# Patient Record
Sex: Female | Born: 1975 | Race: Black or African American | Hispanic: No | Marital: Married | Smoking: Never smoker
Health system: Southern US, Community
[De-identification: ages and names within clinical notes are randomized; demographics above are authoritative.]

## PROBLEM LIST (undated history)

## (undated) DIAGNOSIS — K219 Gastro-esophageal reflux disease without esophagitis: Secondary | ICD-10-CM

## (undated) DIAGNOSIS — E785 Hyperlipidemia, unspecified: Secondary | ICD-10-CM

## (undated) DIAGNOSIS — M25471 Effusion, right ankle: Secondary | ICD-10-CM

## (undated) DIAGNOSIS — Z91018 Allergy to other foods: Secondary | ICD-10-CM

## (undated) DIAGNOSIS — T783XXA Angioneurotic edema, initial encounter: Secondary | ICD-10-CM

## (undated) DIAGNOSIS — I10 Essential (primary) hypertension: Secondary | ICD-10-CM

## (undated) DIAGNOSIS — R5383 Other fatigue: Secondary | ICD-10-CM

## (undated) DIAGNOSIS — K5900 Constipation, unspecified: Secondary | ICD-10-CM

## (undated) DIAGNOSIS — D649 Anemia, unspecified: Secondary | ICD-10-CM

## (undated) DIAGNOSIS — E559 Vitamin D deficiency, unspecified: Secondary | ICD-10-CM

## (undated) DIAGNOSIS — E739 Lactose intolerance, unspecified: Secondary | ICD-10-CM

## (undated) DIAGNOSIS — M2550 Pain in unspecified joint: Secondary | ICD-10-CM

## (undated) DIAGNOSIS — N289 Disorder of kidney and ureter, unspecified: Secondary | ICD-10-CM

## (undated) DIAGNOSIS — M25472 Effusion, left ankle: Secondary | ICD-10-CM

## (undated) DIAGNOSIS — R7303 Prediabetes: Secondary | ICD-10-CM

## (undated) DIAGNOSIS — R0602 Shortness of breath: Secondary | ICD-10-CM

## (undated) DIAGNOSIS — M1990 Unspecified osteoarthritis, unspecified site: Secondary | ICD-10-CM

## (undated) DIAGNOSIS — M549 Dorsalgia, unspecified: Secondary | ICD-10-CM

## (undated) DIAGNOSIS — L509 Urticaria, unspecified: Secondary | ICD-10-CM

## (undated) DIAGNOSIS — M25474 Effusion, right foot: Secondary | ICD-10-CM

## (undated) DIAGNOSIS — M25475 Effusion, left foot: Secondary | ICD-10-CM

## (undated) DIAGNOSIS — Z86718 Personal history of other venous thrombosis and embolism: Secondary | ICD-10-CM

## (undated) HISTORY — DX: Dorsalgia, unspecified: M54.9

## (undated) HISTORY — DX: Allergy to other foods: Z91.018

## (undated) HISTORY — DX: Effusion, left ankle: M25.472

## (undated) HISTORY — DX: Effusion, left foot: M25.475

## (undated) HISTORY — DX: Pain in unspecified joint: M25.50

## (undated) HISTORY — DX: Personal history of other venous thrombosis and embolism: Z86.718

## (undated) HISTORY — DX: Urticaria, unspecified: L50.9

## (undated) HISTORY — DX: Essential (primary) hypertension: I10

## (undated) HISTORY — DX: Other fatigue: R53.83

## (undated) HISTORY — DX: Effusion, right foot: M25.474

## (undated) HISTORY — DX: Gastro-esophageal reflux disease without esophagitis: K21.9

## (undated) HISTORY — DX: Unspecified osteoarthritis, unspecified site: M19.90

## (undated) HISTORY — DX: Disorder of kidney and ureter, unspecified: N28.9

## (undated) HISTORY — DX: Angioneurotic edema, initial encounter: T78.3XXA

## (undated) HISTORY — DX: Hyperlipidemia, unspecified: E78.5

## (undated) HISTORY — PX: TUBAL LIGATION: SHX77

## (undated) HISTORY — DX: Effusion, right ankle: M25.471

## (undated) HISTORY — DX: Shortness of breath: R06.02

## (undated) HISTORY — PX: BREAST EXCISIONAL BIOPSY: SUR124

## (undated) HISTORY — DX: Constipation, unspecified: K59.00

## (undated) HISTORY — DX: Lactose intolerance, unspecified: E73.9

## (undated) HISTORY — DX: Vitamin D deficiency, unspecified: E55.9

## (undated) HISTORY — DX: Prediabetes: R73.03

## (undated) HISTORY — DX: Anemia, unspecified: D64.9

---

## 1997-07-24 ENCOUNTER — Emergency Department (HOSPITAL_COMMUNITY): Admission: EM | Admit: 1997-07-24 | Discharge: 1997-07-24 | Payer: Self-pay | Admitting: Emergency Medicine

## 1999-12-07 ENCOUNTER — Inpatient Hospital Stay (HOSPITAL_COMMUNITY): Admission: AD | Admit: 1999-12-07 | Discharge: 1999-12-07 | Payer: Self-pay | Admitting: Obstetrics & Gynecology

## 1999-12-07 ENCOUNTER — Encounter: Payer: Self-pay | Admitting: Obstetrics & Gynecology

## 1999-12-30 ENCOUNTER — Other Ambulatory Visit: Admission: RE | Admit: 1999-12-30 | Discharge: 1999-12-30 | Payer: Self-pay | Admitting: Obstetrics and Gynecology

## 1999-12-30 ENCOUNTER — Encounter (INDEPENDENT_AMBULATORY_CARE_PROVIDER_SITE_OTHER): Payer: Self-pay

## 2000-06-26 ENCOUNTER — Inpatient Hospital Stay (HOSPITAL_COMMUNITY): Admission: AD | Admit: 2000-06-26 | Discharge: 2000-06-28 | Payer: Self-pay | Admitting: Obstetrics and Gynecology

## 2000-07-22 ENCOUNTER — Other Ambulatory Visit: Admission: RE | Admit: 2000-07-22 | Discharge: 2000-07-22 | Payer: Self-pay | Admitting: Obstetrics and Gynecology

## 2001-04-19 ENCOUNTER — Other Ambulatory Visit: Admission: RE | Admit: 2001-04-19 | Discharge: 2001-04-19 | Payer: Self-pay | Admitting: Obstetrics and Gynecology

## 2001-08-26 ENCOUNTER — Emergency Department (HOSPITAL_COMMUNITY): Admission: EM | Admit: 2001-08-26 | Discharge: 2001-08-26 | Payer: Self-pay | Admitting: Emergency Medicine

## 2002-04-22 ENCOUNTER — Other Ambulatory Visit: Admission: RE | Admit: 2002-04-22 | Discharge: 2002-04-22 | Payer: Self-pay | Admitting: Obstetrics and Gynecology

## 2003-04-25 ENCOUNTER — Other Ambulatory Visit: Admission: RE | Admit: 2003-04-25 | Discharge: 2003-04-25 | Payer: Self-pay | Admitting: Obstetrics and Gynecology

## 2004-06-05 ENCOUNTER — Other Ambulatory Visit: Admission: RE | Admit: 2004-06-05 | Discharge: 2004-06-05 | Payer: Self-pay | Admitting: Obstetrics and Gynecology

## 2005-03-24 HISTORY — PX: BREAST BIOPSY: SHX20

## 2005-08-31 ENCOUNTER — Emergency Department (HOSPITAL_COMMUNITY): Admission: EM | Admit: 2005-08-31 | Discharge: 2005-09-01 | Payer: Self-pay | Admitting: Emergency Medicine

## 2006-02-26 ENCOUNTER — Other Ambulatory Visit: Admission: RE | Admit: 2006-02-26 | Discharge: 2006-02-26 | Payer: Self-pay | Admitting: Obstetrics & Gynecology

## 2007-06-01 ENCOUNTER — Other Ambulatory Visit: Admission: RE | Admit: 2007-06-01 | Discharge: 2007-06-01 | Payer: Self-pay | Admitting: Obstetrics and Gynecology

## 2008-02-15 ENCOUNTER — Other Ambulatory Visit: Admission: RE | Admit: 2008-02-15 | Discharge: 2008-02-15 | Payer: Self-pay | Admitting: Obstetrics and Gynecology

## 2008-06-01 ENCOUNTER — Other Ambulatory Visit: Admission: RE | Admit: 2008-06-01 | Discharge: 2008-06-01 | Payer: Self-pay | Admitting: Obstetrics and Gynecology

## 2008-12-05 ENCOUNTER — Emergency Department (HOSPITAL_COMMUNITY): Admission: EM | Admit: 2008-12-05 | Discharge: 2008-12-06 | Payer: Self-pay | Admitting: Emergency Medicine

## 2008-12-14 ENCOUNTER — Encounter: Payer: Self-pay | Admitting: Obstetrics & Gynecology

## 2008-12-14 ENCOUNTER — Ambulatory Visit (HOSPITAL_COMMUNITY): Admission: RE | Admit: 2008-12-14 | Discharge: 2008-12-14 | Payer: Self-pay | Admitting: Obstetrics & Gynecology

## 2009-01-25 ENCOUNTER — Ambulatory Visit (HOSPITAL_COMMUNITY): Admission: RE | Admit: 2009-01-25 | Discharge: 2009-01-25 | Payer: Self-pay | Admitting: Obstetrics & Gynecology

## 2009-02-24 ENCOUNTER — Inpatient Hospital Stay (HOSPITAL_COMMUNITY): Admission: AD | Admit: 2009-02-24 | Discharge: 2009-02-24 | Payer: Self-pay | Admitting: Obstetrics & Gynecology

## 2009-03-14 ENCOUNTER — Ambulatory Visit (HOSPITAL_COMMUNITY): Admission: RE | Admit: 2009-03-14 | Discharge: 2009-03-14 | Payer: Self-pay | Admitting: Obstetrics & Gynecology

## 2009-05-06 ENCOUNTER — Inpatient Hospital Stay (HOSPITAL_COMMUNITY): Admission: AD | Admit: 2009-05-06 | Discharge: 2009-05-06 | Payer: Self-pay | Admitting: Obstetrics

## 2009-05-06 ENCOUNTER — Inpatient Hospital Stay (HOSPITAL_COMMUNITY): Admission: AD | Admit: 2009-05-06 | Discharge: 2009-05-06 | Payer: Self-pay | Admitting: Maternal and Fetal Medicine

## 2009-05-14 ENCOUNTER — Inpatient Hospital Stay (HOSPITAL_COMMUNITY): Admission: RE | Admit: 2009-05-14 | Discharge: 2009-05-16 | Payer: Self-pay | Admitting: Obstetrics & Gynecology

## 2009-05-14 ENCOUNTER — Encounter: Payer: Self-pay | Admitting: Obstetrics & Gynecology

## 2009-05-14 DIAGNOSIS — O1490 Unspecified pre-eclampsia, unspecified trimester: Secondary | ICD-10-CM

## 2010-04-14 ENCOUNTER — Encounter: Payer: Self-pay | Admitting: Obstetrics & Gynecology

## 2010-04-15 ENCOUNTER — Encounter: Payer: Self-pay | Admitting: Obstetrics & Gynecology

## 2010-06-12 LAB — COMPREHENSIVE METABOLIC PANEL
ALT: 10 U/L (ref 0–35)
AST: 16 U/L (ref 0–37)
Albumin: 2.3 g/dL — ABNORMAL LOW (ref 3.5–5.2)
Alkaline Phosphatase: 158 U/L — ABNORMAL HIGH (ref 39–117)
CO2: 24 mEq/L (ref 19–32)
Calcium: 8.2 mg/dL — ABNORMAL LOW (ref 8.4–10.5)
Calcium: 8.2 mg/dL — ABNORMAL LOW (ref 8.4–10.5)
Creatinine, Ser: 0.62 mg/dL (ref 0.4–1.2)
GFR calc Af Amer: >60 mL/min (ref >60)
GFR calc non Af Amer: >60 mL/min (ref >60)
Glucose, Bld: 77 mg/dL (ref 70–99)
Sodium: 136 mEq/L (ref 135–145)
Total Bilirubin: 0.4 mg/dL (ref 0.3–1.2)
Total Protein: 5.3 g/dL — ABNORMAL LOW (ref 6.0–8.3)

## 2010-06-12 LAB — CBC
HCT: 24.9 % — ABNORMAL LOW (ref 36.0–46.0)
Hemoglobin: 8.3 g/dL — ABNORMAL LOW (ref 12.0–15.0)
MCHC: 31.7 g/dL (ref 30.0–36.0)
MCHC: 31.7 g/dL (ref 30.0–36.0)
MCV: 68.5 fL — ABNORMAL LOW (ref 78.0–100.0)
Platelets: 209 10*3/uL (ref 150–400)
RBC: 3.63 MIL/uL — ABNORMAL LOW (ref 3.87–5.11)
RBC: 3.73 MIL/uL — ABNORMAL LOW (ref 3.87–5.11)
RBC: 4.05 MIL/uL (ref 3.87–5.11)
RDW: 16.3 % — ABNORMAL HIGH (ref 11.5–15.5)
WBC: 13.7 10*3/uL — ABNORMAL HIGH (ref 4.0–10.5)

## 2010-06-12 LAB — URIC ACID: Uric Acid, Serum: 4.8 mg/dL (ref 2.4–7.0)

## 2010-06-12 LAB — RPR: RPR Ser Ql: NONREACTIVE

## 2010-06-12 LAB — LACTATE DEHYDROGENASE: LDH: 124 U/L (ref 94–250)

## 2010-06-25 LAB — CBC
MCHC: 32.6 g/dL (ref 30.0–36.0)
MCV: 76.2 fL — ABNORMAL LOW (ref 78.0–100.0)
Platelets: 228 10*3/uL (ref 150–400)
RBC: 3.48 MIL/uL — ABNORMAL LOW (ref 3.87–5.11)

## 2010-06-25 LAB — URINALYSIS, ROUTINE W REFLEX MICROSCOPIC
Bilirubin Urine: NEGATIVE
Glucose, UA: NEGATIVE mg/dL
Ketones, ur: 40 mg/dL — AB
Leukocytes, UA: NEGATIVE
Specific Gravity, Urine: >1.03 — ABNORMAL HIGH (ref 1.005–1.030)
pH: 6 (ref 5.0–8.0)

## 2010-06-25 LAB — URINE CULTURE

## 2010-06-25 LAB — URINE MICROSCOPIC-ADD ON

## 2010-06-28 LAB — POCT I-STAT, CHEM 8
BUN: 6 mg/dL (ref 6–23)
Calcium, Ion: 1.19 mmol/L (ref 1.12–1.32)
Chloride: 105 mEq/L (ref 96–112)
Creatinine, Ser: 0.8 mg/dL (ref 0.4–1.2)
Glucose, Bld: 87 mg/dL (ref 70–99)
TCO2: 22 mmol/L (ref 0–100)

## 2010-08-22 DIAGNOSIS — O1490 Unspecified pre-eclampsia, unspecified trimester: Secondary | ICD-10-CM

## 2015-01-02 ENCOUNTER — Ambulatory Visit (INDEPENDENT_AMBULATORY_CARE_PROVIDER_SITE_OTHER): Payer: BC Managed Care – PPO | Admitting: Internal Medicine

## 2015-01-02 ENCOUNTER — Encounter (INDEPENDENT_AMBULATORY_CARE_PROVIDER_SITE_OTHER): Payer: Self-pay

## 2015-01-02 ENCOUNTER — Encounter: Payer: Self-pay | Admitting: Internal Medicine

## 2015-01-02 VITALS — BP 136/84 | HR 77 | Temp 98.6°F | Ht 66.0 in | Wt 237.0 lb

## 2015-01-02 DIAGNOSIS — M129 Arthropathy, unspecified: Secondary | ICD-10-CM | POA: Diagnosis not present

## 2015-01-02 DIAGNOSIS — M722 Plantar fascial fibromatosis: Secondary | ICD-10-CM

## 2015-01-02 DIAGNOSIS — M25512 Pain in left shoulder: Secondary | ICD-10-CM | POA: Diagnosis not present

## 2015-01-02 DIAGNOSIS — M170 Bilateral primary osteoarthritis of knee: Secondary | ICD-10-CM

## 2015-01-02 DIAGNOSIS — M25511 Pain in right shoulder: Secondary | ICD-10-CM | POA: Diagnosis not present

## 2015-01-02 LAB — RHEUMATOID FACTOR

## 2015-01-02 NOTE — Progress Notes (Signed)
Pre visit review using our clinic review tool, if applicable. No additional management support is needed unless otherwise documented below in the visit note. 

## 2015-01-02 NOTE — Assessment & Plan Note (Signed)
Exacerbated by weight Discussed trying to lose 5 % of total body weight Take Aleve as needed

## 2015-01-02 NOTE — Patient Instructions (Signed)

## 2015-01-02 NOTE — Progress Notes (Signed)
HPI  Pt presents to the clinic today to establish care. She recently moved from [place].  Arthritis: Mainly in her knees. She does not take anything OTC for this.  She does c/o bilateral shoulder pain. She reports this started 2 months ago. She describes the pain as achy. She feels the most pain when she tries to lift her arms above her head. She denies any weakness, numbness or tingling in her arms. She denies any injury to the area. She has not taken anything OTC for this.  She also c/o pain of pain in her right heel. This started 1 years ago, but has been intermittent. The pain is worse in the morning but does seem to get better throughout the day. She describes the pain as sharp and stabbing. The pain radiates into the arch. She denies numbnees or tingling in her feet. She has tried Ibuprofen with some relief.  Flu: never Tetanus: > 10 years ago Pap Smear: 12/2013- normal Dentist: biannually  Past Medical History  Diagnosis Date  . Arthritis     No current outpatient prescriptions on file.   No current facility-administered medications for this visit.    Allergies  Allergen Reactions  . Latex Rash    Family History  Problem Relation Age of Onset  . Hyperlipidemia Father   . Hypertension Father   . Arthritis Maternal Grandmother     Social History   Social History  . Marital Status: Divorced    Spouse Name: N/A  . Number of Children: N/A  . Years of Education: N/A   Occupational History  . Not on file.   Social History Main Topics  . Smoking status: Never Smoker   . Smokeless tobacco: Never Used  . Alcohol Use: 0.0 oz/week    0 Standard drinks or equivalent per week     Comment: social  . Drug Use: Not on file  . Sexual Activity: Not on file   Other Topics Concern  . Not on file   Social History Narrative  . No narrative on file    ROS:  Constitutional: Denies fever, malaise, fatigue, headache or abrupt weight changes.  Respiratory: Denies  difficulty breathing, shortness of breath, cough or sputum production.   Cardiovascular: Denies chest pain, chest tightness, palpitations or swelling in the hands or feet.  Musculoskeletal: Pt reports joint pains. Denies decrease in range of motion, difficulty with gait, muscle pain or joint swelling.  Skin: Denies redness, rashes, lesions or ulcercations.  Neurological: Denies dizziness, difficulty with memory, difficulty with speech or problems with balance and coordination.  Psych: Denies anxiety, depression, SI/HI.  No other specific complaints in a complete review of systems (except as listed in HPI above).  PE:  BP 136/84 mmHg  Pulse 77  Temp(Src) 98.6 F (37 C) (Oral)  Ht 5' 6"  (1.676 m)  Wt 237 lb (107.502 kg)  BMI 38.27 kg/m2  SpO2 98%  LMP 01/02/2015 Wt Readings from Last 3 Encounters:  01/02/15 237 lb (107.502 kg)    General: Appears her stated age, obese in NAD. Cardiovascular: Normal rate and rhythm. S1,S2 noted.  No murmur, rubs or gallops noted. Pulmonary/Chest: Normal effort and positive vesicular breath sounds. No respiratory distress. No wheezes, rales or ronchi noted.  Musculoskeletal: Normal internal and external rotation of bilateral shoulders. Pain with palpation of bilateral subacromial bursa. Strength 5/5 BUE. Negative drop can test. Pain with palpation of the right heel and right arch. No difficulty with gait note. Neurological: Alert and oriented. Sensation  intact BLE. Psychiatric: Mood and affect normal. Behavior is normal. Judgment and thought content normal.     BMET    Component Value Date/Time   NA 137 05/14/2009 0921   K 3.4* 05/14/2009 0921   CL 107 05/14/2009 0921   CO2 23 05/14/2009 0921   GLUCOSE 76 05/14/2009 0921   BUN 3* 05/14/2009 0921   CREATININE 0.62 05/14/2009 0921   CALCIUM 8.2* 05/14/2009 0921   GFRNONAA >60 05/14/2009 0921   GFRAA  05/14/2009 0921    >60        The eGFR has been calculated using the MDRD equation. This  calculation has not been validated in all clinical situations. eGFR's persistently <60 mL/min signify possible Chronic Kidney Disease.    Lipid Panel  No results found for: CHOL, TRIG, HDL, CHOLHDL, VLDL, LDLCALC  CBC    Component Value Date/Time   WBC 13.7* 05/15/2009 0555   RBC 3.63* 05/15/2009 0555   HGB 8.0* 05/15/2009 0555   HCT 24.9* 05/15/2009 0555   PLT 198 05/15/2009 0555   MCV 68.5* 05/15/2009 0555   MCHC 32.2 05/15/2009 0555   RDW 16.1* 05/15/2009 0555    Hgb A1C No results found for: HGBA1C   Assessment and Plan:  Bilateral Shoulder pain:  ? Bursitis Take Aleve once daily Concern for PMR- will check ESR, RF and ANA If no improvement noted, will consider pred taper  Plantar Fasciitis, right foot:  Information given on rehab sports medicine Stretching, inserts Aleve once daily will help  RTC as needed or if symptoms persist or worsen

## 2015-01-03 LAB — SEDIMENTATION RATE: Sed Rate: 48 mm/hr — ABNORMAL HIGH (ref 0–22)

## 2015-01-03 LAB — ANA: ANA: NEGATIVE

## 2015-02-23 ENCOUNTER — Ambulatory Visit: Payer: BC Managed Care – PPO | Admitting: Internal Medicine

## 2015-05-31 ENCOUNTER — Ambulatory Visit (HOSPITAL_BASED_OUTPATIENT_CLINIC_OR_DEPARTMENT_OTHER)
Admit: 2015-05-31 | Discharge: 2015-05-31 | Disposition: A | Discharge status: Home / Self Care | Payer: BC Managed Care – PPO | Attending: Emergency Medicine | Admitting: Emergency Medicine

## 2015-05-31 ENCOUNTER — Ambulatory Visit: Payer: BC Managed Care – PPO | Admitting: Internal Medicine

## 2015-05-31 ENCOUNTER — Emergency Department (HOSPITAL_COMMUNITY): Payer: BC Managed Care – PPO

## 2015-05-31 ENCOUNTER — Emergency Department (HOSPITAL_COMMUNITY)
Admission: EM | Admit: 2015-05-31 | Discharge: 2015-05-31 | Disposition: A | Discharge status: Home / Self Care | Payer: BC Managed Care – PPO | Attending: Emergency Medicine | Admitting: Emergency Medicine

## 2015-05-31 ENCOUNTER — Encounter (HOSPITAL_COMMUNITY): Payer: Self-pay

## 2015-05-31 DIAGNOSIS — Z91040 Latex allergy status: Secondary | ICD-10-CM | POA: Diagnosis not present

## 2015-05-31 DIAGNOSIS — L03116 Cellulitis of left lower limb: Secondary | ICD-10-CM | POA: Diagnosis not present

## 2015-05-31 DIAGNOSIS — F419 Anxiety disorder, unspecified: Secondary | ICD-10-CM | POA: Diagnosis not present

## 2015-05-31 DIAGNOSIS — Y9289 Other specified places as the place of occurrence of the external cause: Secondary | ICD-10-CM | POA: Diagnosis not present

## 2015-05-31 DIAGNOSIS — Z8739 Personal history of other diseases of the musculoskeletal system and connective tissue: Secondary | ICD-10-CM | POA: Diagnosis not present

## 2015-05-31 DIAGNOSIS — Y9389 Activity, other specified: Secondary | ICD-10-CM | POA: Insufficient documentation

## 2015-05-31 DIAGNOSIS — M7989 Other specified soft tissue disorders: Secondary | ICD-10-CM

## 2015-05-31 DIAGNOSIS — S8992XA Unspecified injury of left lower leg, initial encounter: Secondary | ICD-10-CM | POA: Diagnosis present

## 2015-05-31 DIAGNOSIS — Y998 Other external cause status: Secondary | ICD-10-CM | POA: Insufficient documentation

## 2015-05-31 DIAGNOSIS — W1839XA Other fall on same level, initial encounter: Secondary | ICD-10-CM | POA: Diagnosis not present

## 2015-05-31 DIAGNOSIS — M79609 Pain in unspecified limb: Secondary | ICD-10-CM | POA: Diagnosis not present

## 2015-05-31 MED ORDER — HYDROCODONE-ACETAMINOPHEN 5-325 MG PO TABS: 1.0000 | ORAL_TABLET | ORAL | Status: DC | PRN

## 2015-05-31 MED ORDER — CLINDAMYCIN HCL 300 MG PO CAPS: 300.0000 mg | ORAL_CAPSULE | Freq: Four times a day (QID) | ORAL | Status: DC

## 2015-05-31 MED ORDER — LIDOCAINE-EPINEPHRINE (PF) 2 %-1:200000 IJ SOLN
20.0000 mL | Freq: Once | INTRAMUSCULAR | Status: DC
  Filled 2015-05-31: qty 20

## 2015-05-31 NOTE — ED Notes (Signed)
Pt c/o L lower leg pain and blood blister on anterior R hand after a fall x 2 weeks ago.  Pain score 5/10.  Pt has not taken anything for pain.  Swelling noted to LL leg.  Pt ambulated into department w/o difficulty.

## 2015-05-31 NOTE — Progress Notes (Signed)
VASCULAR LAB PRELIMINARY  PRELIMINARY  PRELIMINARY  PRELIMINARY  Left lower extremity venous duplex completed.    Preliminary report:  Left:  No evidence of DVT, superficial thrombosis, or Baker's cyst.  Gabriella Riley, RVS 05/31/2015, 11:09 AM

## 2015-05-31 NOTE — Discharge Instructions (Signed)
Read the information below.  Use the prescribed medication as directed.  Please discuss all new medications with your pharmacist.  Do not take additional tylenol while taking the prescribed pain medication to avoid overdose.  You may return to the Emergency Department at any time for worsening condition or any new symptoms that concern you.   If you develop increased redness, swelling, pus draining from the wound, uncontrolled pain, or fevers greater than 100.4, return to the ER immediately for a recheck.     Cellulitis Cellulitis is an infection of the skin and the tissue beneath it. The infected area is usually red and tender. Cellulitis occurs most often in the arms and lower legs.  CAUSES  Cellulitis is caused by bacteria that enter the skin through cracks or cuts in the skin. The most common types of bacteria that cause cellulitis are staphylococci and streptococci. SIGNS AND SYMPTOMS   Redness and warmth.  Swelling.  Tenderness or pain.  Fever. DIAGNOSIS  Your health care provider can usually determine what is wrong based on a physical exam. Blood tests may also be done. TREATMENT  Treatment usually involves taking an antibiotic medicine. HOME CARE INSTRUCTIONS   Take your antibiotic medicine as directed by your health care provider. Finish the antibiotic even if you start to feel better.  Keep the infected arm or leg elevated to reduce swelling.  Apply a warm cloth to the affected area up to 4 times per day to relieve pain.  Take medicines only as directed by your health care provider.  Keep all follow-up visits as directed by your health care provider. SEEK MEDICAL CARE IF:   You notice red streaks coming from the infected area.  Your red area gets larger or turns dark in color.  Your bone or joint underneath the infected area becomes painful after the skin has healed.  Your infection returns in the same area or another area.  You notice a swollen bump in the infected  area.  You develop new symptoms.  You have a fever. SEEK IMMEDIATE MEDICAL CARE IF:   You feel very sleepy.  You develop vomiting or diarrhea.  You have a general ill feeling (malaise) with muscle aches and pains.   This information is not intended to replace advice given to you by your health care provider. Make sure you discuss any questions you have with your health care provider.   Document Released: 12/18/2004 Document Revised: 11/29/2014 Document Reviewed: 05/26/2011 Elsevier Interactive Patient Education [employer].

## 2015-05-31 NOTE — ED Provider Notes (Signed)
CSN: [id number]     Arrival date & time 05/31/15  [id number] History   First MD Initiated Contact with Patient 05/31/15 [phone]     Chief Complaint  Patient presents with  . Fall  . Leg Pain     (Consider location/radiation/quality/duration/timing/severity/associated sxs/prior Treatment) The history is provided by the patient.    Pt presents with localized swelling to the left anterior shin that began yesterday.  States she fell two weeks ago and developed a bruise in this area, was somewhat sore but not bothersome.  Yesterday the area started to swelling and become hot and more painful.  She is able to walk.  Denies weakness or numbness of the leg.  Has small blood blister on her right palm from the fall that is not bothering her.  Otherwise denies other injuries.  Has not taken any medication for her symptoms.  Denies fevers, chills, myalgias, CP, SOB.  Has become anxious since coworkers made her concerned for blood clot.  She has no hx recent immobilization, no exogenous estrogen.  Thinks her father had a blood clot at some point but does not know the circumstances or details.    Past Medical History  Diagnosis Date  . Arthritis    Past Surgical History  Procedure Laterality Date  . Breast biopsy Left 2007  . Tubal ligation     Family History  Problem Relation Age of Onset  . Hyperlipidemia Father   . Hypertension Father   . Arthritis Maternal Grandmother    Social History  Substance Use Topics  . Smoking status: Never Smoker   . Smokeless tobacco: Never Used  . Alcohol Use: 0.0 oz/week    0 Standard drinks or equivalent per week     Comment: social   OB History    No data available     Review of Systems  All other systems reviewed and are negative.     Allergies  Latex  Home Medications   Prior to Admission medications   Not on File   BP 135/99 mmHg  Pulse 72  Temp(Src) 98.3 F (36.8 C) (Oral)  Resp 16  SpO2 100%  LMP 05/12/2015 Physical Exam   Constitutional: She appears well-developed and well-nourished. No distress.  HENT:  Head: Normocephalic and atraumatic.  Neck: Neck supple.  Cardiovascular: Normal rate and regular rhythm.   Pulmonary/Chest: Effort normal and breath sounds normal. No respiratory distress. She has no wheezes. She has no rales.  Musculoskeletal:  Left lower leg with localized area of soft swelling, erythema, warmth, tender to palpation.  Distal pulses and sensation intact.    Neurological: She is alert.  Skin: She is not diaphoretic.  Psychiatric: She has a normal mood and affect. Her behavior is normal.  Nursing note and vitals reviewed.   ED Course  Procedures (including critical care time) Labs Review Labs Reviewed - No data to display  Imaging Review Dg Tibia/fibula Left  05/31/2015  CLINICAL DATA:  Fall 5 days ago, distal left lower leg swelling EXAM: LEFT TIBIA AND FIBULA - 2 VIEW COMPARISON:  None. FINDINGS: Four views of the left tibia fibula submitted. No acute fracture or subluxation. Mild soft tissue swelling distal anterior tibial region. No periosteal reaction or bony erosion. IMPRESSION: No acute fracture or subluxation. Mild soft tissue swelling distal anterior tibial region. Electronically Signed   By: Lahoma Crocker M.D.   On: 05/31/2015 09:59   I have personally reviewed and evaluated these images and lab results as part of my  medical decision-making.   EKG Interpretation None       Duplex US is negative.    MDM   Final diagnoses:  Cellulitis of left lower extremity   Afebrile, nontoxic patient with area of soft swelling with overlying cellulitis as the site of trauma. Possibly cellulitis vs Infected hematoma vs less likely abscess.  Pt declines I&D or even aspiration for further investigation.  Pt agrees to close PCP or ED follow up for recheck in 2 days, discussed return precautions.  D/C home with clindamycin, norco, crutches.  2 day recheck.   Discussed result, findings,  treatment, and follow up  with patient.  Pt givurn  precautions.  Pt verbalizes understanding and agrees with plan.         Clayton Bibles, PA-C 05/31/15 Kyri Dai [place], MD 06/01/15 [phone]

## 2015-05-31 NOTE — ED Notes (Signed)
Bed: WA01 Expected date:  Expected time:  Means of arrival:  Comments: 

## 2015-05-31 NOTE — ED Notes (Signed)
Vascular at bedside

## 2015-06-03 ENCOUNTER — Emergency Department (HOSPITAL_COMMUNITY)
Admission: EM | Admit: 2015-06-03 | Discharge: 2015-06-03 | Disposition: A | Discharge status: Home / Self Care | Payer: BC Managed Care – PPO | Attending: Emergency Medicine | Admitting: Emergency Medicine

## 2015-06-03 DIAGNOSIS — L03116 Cellulitis of left lower limb: Secondary | ICD-10-CM | POA: Diagnosis not present

## 2015-06-03 DIAGNOSIS — Z09 Encounter for follow-up examination after completed treatment for conditions other than malignant neoplasm: Secondary | ICD-10-CM | POA: Diagnosis present

## 2015-06-03 DIAGNOSIS — Z792 Long term (current) use of antibiotics: Secondary | ICD-10-CM | POA: Diagnosis not present

## 2015-06-03 DIAGNOSIS — M1990 Unspecified osteoarthritis, unspecified site: Secondary | ICD-10-CM | POA: Insufficient documentation

## 2015-06-03 DIAGNOSIS — Z91040 Latex allergy status: Secondary | ICD-10-CM | POA: Diagnosis not present

## 2015-06-03 NOTE — Discharge Instructions (Signed)

## 2015-06-03 NOTE — ED Provider Notes (Signed)
CSN: [id number]     Arrival date & time 06/03/15  0931 History   First MD Initiated Contact with Patient 06/03/15 [phone]     Chief Complaint  Patient presents with  . Follow-up     (Consider location/radiation/quality/duration/timing/severity/associated sxs/prior Treatment) HPI Comments: 40 year old female who presents for reevaluation of left leg pain. The patient was seen here 2 days ago for left lower leg pain and swelling in an area that she had previously developed a bruise from minor trauma. She was diagnosed with cellulitis and given clindamycin. DVT ultrasound was negative. She was discharged home with instructions to follow-up in 2 days for reevaluation. The patient presents today for follow-up. She's been taking clindamycin as prescribed. She reports an improvement in erythema but does note that she has persistent pain which is worse after she stands up and worse after she has been walking on her leg for a while. She has intermittent swelling that is worse when she has been on her feet. She denies any fevers, vomiting, or systemic illness. Normal sensation in her foot.  The history is provided by the patient.    Past Medical History  Diagnosis Date  . Arthritis    Past Surgical History  Procedure Laterality Date  . Breast biopsy Left 2007  . Tubal ligation     Family History  Problem Relation Age of Onset  . Hyperlipidemia Father   . Hypertension Father   . Arthritis Maternal Grandmother    Social History  Substance Use Topics  . Smoking status: Never Smoker   . Smokeless tobacco: Never Used  . Alcohol Use: 0.0 oz/week    0 Standard drinks or equivalent per week     Comment: social   OB History    No data available     Review of Systems 10 Systems reviewed and are negative for acute change except as noted in the HPI.    Allergies  Latex  Home Medications   Prior to Admission medications   Medication Sig Start Date End Date Taking? Authorizing Provider   Aspirin-Acetaminophen-Caffeine (GOODY HEADACHE PO) Take 1 Dose by mouth daily as needed (pain).    Historical Provider, MD  clindamycin (CLEOCIN) 300 MG capsule Take 1 capsule (300 mg total) by mouth 4 (four) times daily. 05/31/15   Clayton Bibles, PA-C  HYDROcodone-acetaminophen (NORCO/VICODIN) 5-325 MG tablet Take 1 tablet by mouth every 4 (four) hours as needed for moderate pain or severe pain. 05/31/15   Clayton Bibles, PA-C   BP 169/97 mmHg  Pulse 72  Temp(Src) 98.2 F (36.8 C) (Oral)  Resp 18  Wt 237 lb (107.502 kg)  SpO2 100%  LMP 05/12/2015 Physical Exam  Constitutional: She is oriented to person, place, and time. She appears well-developed and well-nourished. No distress.  HENT:  Head: Normocephalic and atraumatic.  Eyes: Conjunctivae are normal.  Musculoskeletal:  Area of mild swelling, erythema and TTP on anterior L lower leg; 2+ pedal pulse, normal sensation L foot  Neurological: She is alert and oriented to person, place, and time.  Skin: Skin is warm and dry.  Mild erythema on anterior L lower leg above ankle w/ focal area of induration; erythema has receded from line drawn on 3/7  Psychiatric: She has a normal mood and affect. Judgment normal.  Nursing note and vitals reviewed.   ED Course  Procedures (including critical care time) Labs Review Labs Reviewed - No data to display  ULTRASOUND LIMITED SOFT TISSUE/ MUSCULOSKELETAL:  Indication: left lower leg pain and  swelling Linear probe used to evaluate area of interest in two planes. Findings:  No fluid collection, no significant cobblestoning Performed by: Dr Rex Kras Images saved electronically    MDM   Final diagnoses:  Left leg cellulitis    Patient presents for follow-up of left lower leg cellulitis after she was given clindamycin 2 days ago. She was well-appearing and afebrile at presentation. She had a small area of redness and induration on her anterior left lower leg. The area of redness had receded from the  original line drawn 2 days ago, reassuring that the area seems to be improving. Bedside ultrasound shows no fluid collection or significant cobblestoning. She denies other complaints. Instructed on supportive care including elevation and compression and extensively reviewed return precautions. Patient voiced understanding and was discharged in satisfactory condition.   Sharlett Iles, MD 06/03/15 1021

## 2015-06-03 NOTE — ED Notes (Signed)
Seen here on 3/9 for cellulitis to left lower extremity. Taking clindamycin as prescribed x 2.5 days. Some improvement in erythema but pain is increasing.

## 2015-06-04 ENCOUNTER — Ambulatory Visit (INDEPENDENT_AMBULATORY_CARE_PROVIDER_SITE_OTHER): Payer: BC Managed Care – PPO | Admitting: Internal Medicine

## 2015-06-04 ENCOUNTER — Encounter: Payer: Self-pay | Admitting: Internal Medicine

## 2015-06-04 VITALS — BP 134/94 | HR 82 | Temp 98.2°F

## 2015-06-04 DIAGNOSIS — S8012XS Contusion of left lower leg, sequela: Secondary | ICD-10-CM | POA: Diagnosis not present

## 2015-06-04 DIAGNOSIS — T3695XA Adverse effect of unspecified systemic antibiotic, initial encounter: Secondary | ICD-10-CM

## 2015-06-04 DIAGNOSIS — L03116 Cellulitis of left lower limb: Secondary | ICD-10-CM

## 2015-06-04 DIAGNOSIS — B379 Candidiasis, unspecified: Secondary | ICD-10-CM | POA: Diagnosis not present

## 2015-06-04 MED ORDER — IBUPROFEN 800 MG PO TABS
800.0000 mg | ORAL_TABLET | Freq: Three times a day (TID) | ORAL | Status: DC | PRN
Start: 2015-06-04 — End: 2015-07-04

## 2015-06-04 MED ORDER — FLUCONAZOLE 150 MG PO TABS: 150.0000 mg | ORAL_TABLET | Freq: Once | ORAL | Status: DC

## 2015-06-04 MED ORDER — CLINDAMYCIN HCL 300 MG PO CAPS: 300.0000 mg | ORAL_CAPSULE | Freq: Four times a day (QID) | ORAL | Status: DC

## 2015-06-04 NOTE — Progress Notes (Signed)
 Subjective:    Patient ID: Gabriella Riley, female    DOB: 04/08/1975, 39 y.o.   MRN: [id number]  HPI  Pt presents to the clinic today for ER followup. She went to [facility] ED, 05/31/15 with c/o left leg swelling, redness and pain. No abscess was identified. Ultrasound was negative for DVT. She was diagnosed with cellulitis and placed on Clindamycin. She went back to the ER 3/12 for a recheck. It was felt that her cellulitis was improving. She was advised to follow up with her PCP. She has noticed improvement in the redness. It is still very swollen and tender. She denies fever or chills. She has been taking Clindamycin, using warm compresses and TED hose as advised. She tried to go back to work today, but she walks a lot, causing increased pain.   Review of Systems      Past Medical History  Diagnosis Date  . Arthritis     Current Outpatient Prescriptions  Medication Sig Dispense Refill  . Aspirin-Acetaminophen-Caffeine (GOODY HEADACHE PO) Take 1 Dose by mouth daily as needed (pain).    . clindamycin (CLEOCIN) 300 MG capsule Take 1 capsule (300 mg total) by mouth 4 (four) times daily. 28 capsule 0  . HYDROcodone-acetaminophen (NORCO/VICODIN) 5-325 MG tablet Take 1 tablet by mouth every 4 (four) hours as needed for moderate pain or severe pain. 15 tablet 0   No current facility-administered medications for this visit.    Allergies  Allergen Reactions  . Latex Rash    Family History  Problem Relation Age of Onset  . Hyperlipidemia Father   . Hypertension Father   . Arthritis Maternal Grandmother     Social History   Social History  . Marital Status: Divorced    Spouse Name: N/A  . Number of Children: N/A  . Years of Education: N/A   Occupational History  . Not on file.   Social History Main Topics  . Smoking status: Never Smoker   . Smokeless tobacco: Never Used  . Alcohol Use: 0.0 oz/week    0 Standard drinks or equivalent per week     Comment: social  . Drug Use: No    . Sexual Activity: Not on file   Other Topics Concern  . Not on file   Social History Narrative     Constitutional: Denies fever, malaise, fatigue, headache or abrupt weight changes.  Respiratory: Denies difficulty breathing, shortness of breath, cough or sputum production.   Cardiovascular: Denies chest pain, chest tightness, palpitations or swelling in the hands or feet.  Musculoskeletal: Pt reports difficulty with gait. Denies decrease in range of motion, muscle pain or joint pain and swelling.  Skin: Pt reports left leg swelling and redness. Denies rashes, lesions or ulcercations.  Neurological: Denies dizziness, difficulty with memory, difficulty with speech or problems with balance and coordination.    No other specific complaints in a complete review of systems (except as listed in HPI above).  Objective:   Physical Exam  BP 134/94 mmHg  Pulse 82  Temp(Src) 98.2 F (36.8 C) (Oral)  Wt   SpO2 99%  LMP 05/12/2015 Wt Readings from Last 3 Encounters:  06/03/15 237 lb (107.502 kg)  01/02/15 237 lb (107.502 kg)    General: Appears her stated age, obese in NAD. Skin: 2 cm round infected hematoma of left anterior shin. Pretibial swelling noted, extending down into ankle. Cardiovascular: Normal rate and rhythm. S1,S2 noted.  No murmur, rubs or gallops noted. 2+ pedal pulse noted   on left. Pulmonary/Chest: Normal effort and positive vesicular breath sounds. No respiratory distress. No wheezes, rales or ronchi noted.  Musculoskeletal: Normal flexion and extension of the left knee. Decreased flexion, extension and rotation of left ankle secondary to pain. Neurological: Alert and oriented.    BMET    Component Value Date/Time   NA 137 05/14/2009 0921   K 3.4* 05/14/2009 0921   CL 107 05/14/2009 0921   CO2 23 05/14/2009 0921   GLUCOSE 76 05/14/2009 0921   BUN 3* 05/14/2009 0921   CREATININE 0.62 05/14/2009 0921   CALCIUM 8.2* 05/14/2009 0921   GFRNONAA >60 05/14/2009  0921   GFRAA  05/14/2009 0921    >60        The eGFR has been calculated using the MDRD equation. This calculation has not been validated in all clinical situations. eGFR's persistently <60 mL/min signify possible Chronic Kidney Disease.    Lipid Panel  No results found for: CHOL, TRIG, HDL, CHOLHDL, VLDL, LDLCALC  CBC    Component Value Date/Time   WBC 13.7* 05/15/2009 0555   RBC 3.63* 05/15/2009 0555   HGB 8.0* 05/15/2009 0555   HCT 24.9* 05/15/2009 0555   PLT 198 05/15/2009 0555   MCV 68.5* 05/15/2009 0555   MCHC 32.2 05/15/2009 0555   RDW 16.1* 05/15/2009 0555    Hgb A1C No results found for: HGBA1C       Assessment & Plan:   ER follow up for LLE cellulitis secondary to infected hematoma:  ER notes, labs and imaging reviewed Cellulitis is improving Continue elevation Ibuprofen and warm compresses for pain and inflammation Continue Clindamycin, will extend another 7 days, RX given eRx for Diflucan for antibiotic induced yeast infection Work note provided Return precautions given  RTC as needed or if symptoms persist or worsen

## 2015-06-04 NOTE — Patient Instructions (Signed)

## 2015-06-04 NOTE — Progress Notes (Signed)
Pre visit review using our clinic review tool, if applicable. No additional management support is needed unless otherwise documented below in the visit note. 

## 2015-06-20 ENCOUNTER — Other Ambulatory Visit: Payer: Self-pay | Admitting: Internal Medicine

## 2015-06-20 MED ORDER — FLUCONAZOLE 150 MG PO TABS
150.0000 mg | ORAL_TABLET | Freq: Once | ORAL | Status: DC
Start: 2015-06-20 — End: 2015-07-04

## 2015-06-20 NOTE — Telephone Encounter (Signed)
You saw pt 06/04/2015 was prescribe Ax and given 1 diflucan--please advise if okay to send in 1 more pill for treatment or will pt need OV

## 2015-07-04 ENCOUNTER — Encounter: Payer: Self-pay | Admitting: Obstetrics & Gynecology

## 2015-07-04 ENCOUNTER — Ambulatory Visit (INDEPENDENT_AMBULATORY_CARE_PROVIDER_SITE_OTHER): Payer: BC Managed Care – PPO | Admitting: Obstetrics & Gynecology

## 2015-07-04 VITALS — BP 149/85 | HR 73 | Resp 18 | Ht 67.0 in | Wt 237.0 lb

## 2015-07-04 DIAGNOSIS — Z01419 Encounter for gynecological examination (general) (routine) without abnormal findings: Secondary | ICD-10-CM

## 2015-07-04 DIAGNOSIS — Z124 Encounter for screening for malignant neoplasm of cervix: Secondary | ICD-10-CM | POA: Diagnosis not present

## 2015-07-04 DIAGNOSIS — Z1151 Encounter for screening for human papillomavirus (HPV): Secondary | ICD-10-CM | POA: Diagnosis not present

## 2015-07-04 NOTE — Patient Instructions (Signed)
Preventive Care for Adults, Female A healthy lifestyle and preventive care can promote health and wellness. Preventive health guidelines for women include the following key practices.  A routine yearly physical is a good way to check with your health care provider about your health and preventive screening. It is a chance to share any concerns and updates on your health and to receive a thorough exam.  Visit your dentist for a routine exam and preventive care every 6 months. Brush your teeth twice a day and floss once a day. Good oral hygiene prevents tooth decay and gum disease.  The frequency of eye exams is based on your age, health, family medical history, use of contact lenses, and other factors. Follow your health care provider's recommendations for frequency of eye exams.  Eat a healthy diet. Foods like vegetables, fruits, whole grains, low-fat dairy products, and lean protein foods contain the nutrients you need without too many calories. Decrease your intake of foods high in solid fats, added sugars, and salt. Eat the right amount of calories for you.Get information about a proper diet from your health care provider, if necessary.  Regular physical exercise is one of the most important things you can do for your health. Most adults should get at least 150 minutes of moderate-intensity exercise (any activity that increases your heart rate and causes you to sweat) each week. In addition, most adults need muscle-strengthening exercises on 2 or more days a week.  Maintain a healthy weight. The body mass index (BMI) is a screening tool to identify possible weight problems. It provides an estimate of body fat based on height and weight. Your health care provider can find your BMI and can help you achieve or maintain a healthy weight.For adults 20 years and older:  A BMI below 18.5 is considered underweight.  A BMI of 18.5 to 24.9 is normal.  A BMI of 25 to 29.9 is considered overweight.  A  BMI of 30 and above is considered obese.  Maintain normal blood lipids and cholesterol levels by exercising and minimizing your intake of saturated fat. Eat a balanced diet with plenty of fruit and vegetables. Blood tests for lipids and cholesterol should begin at age 45 and be repeated every 5 years. If your lipid or cholesterol levels are high, you are over 50, or you are at high risk for heart disease, you may need your cholesterol levels checked more frequently.Ongoing high lipid and cholesterol levels should be treated with medicines if diet and exercise are not working.  If you smoke, find out from your health care provider how to quit. If you do not use tobacco, do not start.  Lung cancer screening is recommended for adults aged 45-80 years who are at high risk for developing lung cancer because of a history of smoking. A yearly low-dose CT scan of the lungs is recommended for people who have at least a 30-pack-year history of smoking and are a current smoker or have quit within the past 15 years. A pack year of smoking is smoking an average of 1 pack of cigarettes a day for 1 year (for example: 1 pack a day for 30 years or 2 packs a day for 15 years). Yearly screening should continue until the smoker has stopped smoking for at least 15 years. Yearly screening should be stopped for people who develop a health problem that would prevent them from having lung cancer treatment.  If you are pregnant, do not drink alcohol. If you are  breastfeeding, be very cautious about drinking alcohol. If you are not pregnant and choose to drink alcohol, do not have more than 1 drink per day. One drink is considered to be 12 ounces (355 mL) of beer, 5 ounces (148 mL) of wine, or 1.5 ounces (44 mL) of liquor.  Avoid use of street drugs. Do not share needles with anyone. Ask for help if you need support or instructions about stopping the use of drugs.  High blood pressure causes heart disease and increases the risk  of stroke. Your blood pressure should be checked at least every 1 to 2 years. Ongoing high blood pressure should be treated with medicines if weight loss and exercise do not work.  If you are 55-79 years old, ask your health care provider if you should take aspirin to prevent strokes.  Diabetes screening is done by taking a blood sample to check your blood glucose level after you have not eaten for a certain period of time (fasting). If you are not overweight and you do not have risk factors for diabetes, you should be screened once every 3 years starting at age 45. If you are overweight or obese and you are 40-70 years of age, you should be screened for diabetes every year as part of your cardiovascular risk assessment.  Breast cancer screening is essential preventive care for women. You should practice "breast self-awareness." This means understanding the normal appearance and feel of your breasts and may include breast self-examination. Any changes detected, no matter how small, should be reported to a health care provider. Women in their 20s and 30s should have a clinical breast exam (CBE) by a health care provider as part of a regular health exam every 1 to 3 years. After age 40, women should have a CBE every year. Starting at age 40, women should consider having a mammogram (breast X-ray test) every year. Women who have a family history of breast cancer should talk to their health care provider about genetic screening. Women at a high risk of breast cancer should talk to their health care providers about having an MRI and a mammogram every year.  Breast cancer gene (BRCA)-related cancer risk assessment is recommended for women who have family members with BRCA-related cancers. BRCA-related cancers include breast, ovarian, tubal, and peritoneal cancers. Having family members with these cancers may be associated with an increased risk for harmful changes (mutations) in the breast cancer genes BRCA1 and  BRCA2. Results of the assessment will determine the need for genetic counseling and BRCA1 and BRCA2 testing.  Your health care provider may recommend that you be screened regularly for cancer of the pelvic organs (ovaries, uterus, and vagina). This screening involves a pelvic examination, including checking for microscopic changes to the surface of your cervix (Pap test). You may be encouraged to have this screening done every 3 years, beginning at age 21.  For women ages 30-65, health care providers may recommend pelvic exams and Pap testing every 3 years, or they may recommend the Pap and pelvic exam, combined with testing for human papilloma virus (HPV), every 5 years. Some types of HPV increase your risk of cervical cancer. Testing for HPV may also be done on women of any age with unclear Pap test results.  Other health care providers may not recommend any screening for nonpregnant women who are considered low risk for pelvic cancer and who do not have symptoms. Ask your health care provider if a screening pelvic exam is right for   you.  If you have had past treatment for cervical cancer or a condition that could lead to cancer, you need Pap tests and screening for cancer for at least 20 years after your treatment. If Pap tests have been discontinued, your risk factors (such as having a new sexual partner) need to be reassessed to determine if screening should resume. Some women have medical problems that increase the chance of getting cervical cancer. In these cases, your health care provider may recommend more frequent screening and Pap tests.  Colorectal cancer can be detected and often prevented. Most routine colorectal cancer screening begins at the age of 50 years and continues through age 75 years. However, your health care provider may recommend screening at an earlier age if you have risk factors for colon cancer. On a yearly basis, your health care provider may provide home test kits to check  for hidden blood in the stool. Use of a small camera at the end of a tube, to directly examine the colon (sigmoidoscopy or colonoscopy), can detect the earliest forms of colorectal cancer. Talk to your health care provider about this at age 50, when routine screening begins. Direct exam of the colon should be repeated every 5-10 years through age 75 years, unless early forms of precancerous polyps or small growths are found.  People who are at an increased risk for hepatitis B should be screened for this virus. You are considered at high risk for hepatitis B if:  You were born in a country where hepatitis B occurs often. Talk with your health care provider about which countries are considered high risk.  Your parents were born in a high-risk country and you have not received a shot to protect against hepatitis B (hepatitis B vaccine).  You have HIV or AIDS.  You use needles to inject street drugs.  You live with, or have sex with, someone who has hepatitis B.  You get hemodialysis treatment.  You take certain medicines for conditions like cancer, organ transplantation, and autoimmune conditions.  Hepatitis C blood testing is recommended for all people born from 1945 through 1965 and any individual with known risks for hepatitis C.  Practice safe sex. Use condoms and avoid high-risk sexual practices to reduce the spread of sexually transmitted infections (STIs). STIs include gonorrhea, chlamydia, syphilis, trichomonas, herpes, HPV, and human immunodeficiency virus (HIV). Herpes, HIV, and HPV are viral illnesses that have no cure. They can result in disability, cancer, and death.  You should be screened for sexually transmitted illnesses (STIs) including gonorrhea and chlamydia if:  You are sexually active and are younger than 24 years.  You are older than 24 years and your health care provider tells you that you are at risk for this type of infection.  Your sexual activity has changed  since you were last screened and you are at an increased risk for chlamydia or gonorrhea. Ask your health care provider if you are at risk.  If you are at risk of being infected with HIV, it is recommended that you take a prescription medicine daily to prevent HIV infection. This is called preexposure prophylaxis (PrEP). You are considered at risk if:  You are sexually active and do not regularly use condoms or know the HIV status of your partner(s).  You take drugs by injection.  You are sexually active with a partner who has HIV.  Talk with your health care provider about whether you are at high risk of being infected with HIV. If   you choose to begin PrEP, you should first be tested for HIV. You should then be tested every 3 months for as long as you are taking PrEP.  Osteoporosis is a disease in which the bones lose minerals and strength with aging. This can result in serious bone fractures or breaks. The risk of osteoporosis can be identified using a bone density scan. Women ages 67 years and over and women at risk for fractures or osteoporosis should discuss screening with their health care providers. Ask your health care provider whether you should take a calcium supplement or vitamin D to reduce the rate of osteoporosis.  Menopause can be associated with physical symptoms and risks. Hormone replacement therapy is available to decrease symptoms and risks. You should talk to your health care provider about whether hormone replacement therapy is right for you.  Use sunscreen. Apply sunscreen liberally and repeatedly throughout the day. You should seek shade when your shadow is shorter than you. Protect yourself by wearing long sleeves, pants, a wide-brimmed hat, and sunglasses year round, whenever you are outdoors.  Once a month, do a whole body skin exam, using a mirror to look at the skin on your back. Tell your health care provider of new moles, moles that have irregular borders, moles that  are larger than a pencil eraser, or moles that have changed in shape or color.  Stay current with required vaccines (immunizations).  Influenza vaccine. All adults should be immunized every year.  Tetanus, diphtheria, and acellular pertussis (Td, Tdap) vaccine. Pregnant women should receive 1 dose of Tdap vaccine during each pregnancy. The dose should be obtained regardless of the length of time since the last dose. Immunization is preferred during the 27th-36th week of gestation. An adult who has not previously received Tdap or who does not know her vaccine status should receive 1 dose of Tdap. This initial dose should be followed by tetanus and diphtheria toxoids (Td) booster doses every 10 years. Adults with an unknown or incomplete history of completing a 3-dose immunization series with Td-containing vaccines should begin or complete a primary immunization series including a Tdap dose. Adults should receive a Td booster every 10 years.  Varicella vaccine. An adult without evidence of immunity to varicella should receive 2 doses or a second dose if she has previously received 1 dose. Pregnant females who do not have evidence of immunity should receive the first dose after pregnancy. This first dose should be obtained before leaving the health care facility. The second dose should be obtained 4-8 weeks after the first dose.  Human papillomavirus (HPV) vaccine. Females aged 13-26 years who have not received the vaccine previously should obtain the 3-dose series. The vaccine is not recommended for use in pregnant females. However, pregnancy testing is not needed before receiving a dose. If a female is found to be pregnant after receiving a dose, no treatment is needed. In that case, the remaining doses should be delayed until after the pregnancy. Immunization is recommended for any person with an immunocompromised condition through the age of 61 years if she did not get any or all doses earlier. During the  3-dose series, the second dose should be obtained 4-8 weeks after the first dose. The third dose should be obtained 24 weeks after the first dose and 16 weeks after the second dose.  Zoster vaccine. One dose is recommended for adults aged 30 years or older unless certain conditions are present.  Measles, mumps, and rubella (MMR) vaccine. Adults born  before 1957 generally are considered immune to measles and mumps. Adults born in 1957 or later should have 1 or more doses of MMR vaccine unless there is a contraindication to the vaccine or there is laboratory evidence of immunity to each of the three diseases. A routine second dose of MMR vaccine should be obtained at least 28 days after the first dose for students attending postsecondary schools, health care workers, or international travelers. People who received inactivated measles vaccine or an unknown type of measles vaccine during 1963-1967 should receive 2 doses of MMR vaccine. People who received inactivated mumps vaccine or an unknown type of mumps vaccine before 1979 and are at high risk for mumps infection should consider immunization with 2 doses of MMR vaccine. For females of childbearing age, rubella immunity should be determined. If there is no evidence of immunity, females who are not pregnant should be vaccinated. If there is no evidence of immunity, females who are pregnant should delay immunization until after pregnancy. Unvaccinated health care workers born before 1957 who lack laboratory evidence of measles, mumps, or rubella immunity or laboratory confirmation of disease should consider measles and mumps immunization with 2 doses of MMR vaccine or rubella immunization with 1 dose of MMR vaccine.  Pneumococcal 13-valent conjugate (PCV13) vaccine. When indicated, a person who is uncertain of his immunization history and has no record of immunization should receive the PCV13 vaccine. All adults 65 years of age and older should receive this  vaccine. An adult aged 19 years or older who has certain medical conditions and has not been previously immunized should receive 1 dose of PCV13 vaccine. This PCV13 should be followed with a dose of pneumococcal polysaccharide (PPSV23) vaccine. Adults who are at high risk for pneumococcal disease should obtain the PPSV23 vaccine at least 8 weeks after the dose of PCV13 vaccine. Adults older than 39 years of age who have normal immune system function should obtain the PPSV23 vaccine dose at least 1 year after the dose of PCV13 vaccine.  Pneumococcal polysaccharide (PPSV23) vaccine. When PCV13 is also indicated, PCV13 should be obtained first. All adults aged 65 years and older should be immunized. An adult younger than age 65 years who has certain medical conditions should be immunized. Any person who resides in a nursing home or long-term care facility should be immunized. An adult smoker should be immunized. People with an immunocompromised condition and certain other conditions should receive both PCV13 and PPSV23 vaccines. People with human immunodeficiency virus (HIV) infection should be immunized as soon as possible after diagnosis. Immunization during chemotherapy or radiation therapy should be avoided. Routine use of PPSV23 vaccine is not recommended for American Indians, [place] Natives, or people younger than 65 years unless there are medical conditions that require PPSV23 vaccine. When indicated, people who have unknown immunization and have no record of immunization should receive PPSV23 vaccine. One-time revaccination 5 years after the first dose of PPSV23 is recommended for people aged 19-64 years who have chronic kidney failure, nephrotic syndrome, asplenia, or immunocompromised conditions. People who received 1-2 doses of PPSV23 before age 65 years should receive another dose of PPSV23 vaccine at age 65 years or later if at least 5 years have passed since the previous dose. Doses of PPSV23 are not  needed for people immunized with PPSV23 at or after age 65 years.  Meningococcal vaccine. Adults with asplenia or persistent complement component deficiencies should receive 2 doses of quadrivalent meningococcal conjugate (MenACWY-D) vaccine. The doses should be obtained   at least 2 months apart. Microbiologists working with certain meningococcal bacteria, [place] recruits, people at risk during an outbreak, and people who travel to or live in countries with a high rate of meningitis should be immunized. A first-year college student up through age 34 years who is living in a residence hall should receive a dose if she did not receive a dose on or after her 16th birthday. Adults who have certain high-risk conditions should receive one or more doses of vaccine.  Hepatitis A vaccine. Adults who wish to be protected from this disease, have certain high-risk conditions, work with hepatitis A-infected animals, work in hepatitis A research labs, or travel to or work in countries with a high rate of hepatitis A should be immunized. Adults who were previously unvaccinated and who anticipate close contact with an international adoptee during the first 60 days after arrival in the [place] from a country with a high rate of hepatitis A should be immunized.  Hepatitis B vaccine. Adults who wish to be protected from this disease, have certain high-risk conditions, may be exposed to blood or other infectious body fluids, are household contacts or sex partners of hepatitis B positive people, are clients or workers in certain care facilities, or travel to or work in countries with a high rate of hepatitis B should be immunized.  Haemophilus influenzae type b (Hib) vaccine. A previously unvaccinated person with asplenia or sickle cell disease or having a scheduled splenectomy should receive 1 dose of Hib vaccine. Regardless of previous immunization, a recipient of a hematopoietic stem cell transplant should receive a  3-dose series 6-12 months after her successful transplant. Hib vaccine is not recommended for adults with HIV infection. Preventive Services / Frequency Ages 35 to 4 years  Blood pressure check.** / Every 3-5 years.  Lipid and cholesterol check.** / Every 5 years beginning at age 60.  Clinical breast exam.** / Every 3 years for women in their 71s and 10s.  BRCA-related cancer risk assessment.** / For women who have family members with a BRCA-related cancer (breast, ovarian, tubal, or peritoneal cancers).  Pap test.** / Every 2 years from ages 76 through 26. Every 3 years starting at age 61 through age 76 or 93 with a history of 3 consecutive normal Pap tests.  HPV screening.** / Every 3 years from ages 37 through ages 60 to 51 with a history of 3 consecutive normal Pap tests.  Hepatitis C blood test.** / For any individual with known risks for hepatitis C.  Skin self-exam. / Monthly.  Influenza vaccine. / Every year.  Tetanus, diphtheria, and acellular pertussis (Tdap, Td) vaccine.** / Consult your health care provider. Pregnant women should receive 1 dose of Tdap vaccine during each pregnancy. 1 dose of Td every 10 years.  Varicella vaccine.** / Consult your health care provider. Pregnant females who do not have evidence of immunity should receive the first dose after pregnancy.  HPV vaccine. / 3 doses over 6 months, if 93 and younger. The vaccine is not recommended for use in pregnant females. However, pregnancy testing is not needed before receiving a dose.  Measles, mumps, rubella (MMR) vaccine.** / You need at least 1 dose of MMR if you were born in 1957 or later. You may also need a 2nd dose. For females of childbearing age, rubella immunity should be determined. If there is no evidence of immunity, females who are not pregnant should be vaccinated. If there is no evidence of immunity, females who are  pregnant should delay immunization until after pregnancy.  Pneumococcal  13-valent conjugate (PCV13) vaccine.** / Consult your health care provider.  Pneumococcal polysaccharide (PPSV23) vaccine.** / 1 to 2 doses if you smoke cigarettes or if you have certain conditions.  Meningococcal vaccine.** / 1 dose if you are age 68 to 8 years and a Market researcher living in a residence hall, or have one of several medical conditions, you need to get vaccinated against meningococcal disease. You may also need additional booster doses.  Hepatitis A vaccine.** / Consult your health care provider.  Hepatitis B vaccine.** / Consult your health care provider.  Haemophilus influenzae type b (Hib) vaccine.** / Consult your health care provider. Ages 7 to 53 years  Blood pressure check.** / Every year.  Lipid and cholesterol check.** / Every 5 years beginning at age 25 years.  Lung cancer screening. / Every year if you are aged 11-80 years and have a 30-pack-year history of smoking and currently smoke or have quit within the past 15 years. Yearly screening is stopped once you have quit smoking for at least 15 years or develop a health problem that would prevent you from having lung cancer treatment.  Clinical breast exam.** / Every year after age 48 years.  BRCA-related cancer risk assessment.** / For women who have family members with a BRCA-related cancer (breast, ovarian, tubal, or peritoneal cancers).  Mammogram.** / Every year beginning at age 41 years and continuing for as long as you are in good health. Consult with your health care provider.  Pap test.** / Every 3 years starting at age 65 years through age 37 or 70 years with a history of 3 consecutive normal Pap tests.  HPV screening.** / Every 3 years from ages 72 years through ages 60 to 40 years with a history of 3 consecutive normal Pap tests.  Fecal occult blood test (FOBT) of stool. / Every year beginning at age 21 years and continuing until age 5 years. You may not need to do this test if you get  a colonoscopy every 10 years.  Flexible sigmoidoscopy or colonoscopy.** / Every 5 years for a flexible sigmoidoscopy or every 10 years for a colonoscopy beginning at age 35 years and continuing until age 48 years.  Hepatitis C blood test.** / For all people born from [birth year] through 1965 and any individual with known risks for hepatitis C.  Skin self-exam. / Monthly.  Influenza vaccine. / Every year.  Tetanus, diphtheria, and acellular pertussis (Tdap/Td) vaccine.** / Consult your health care provider. Pregnant women should receive 1 dose of Tdap vaccine during each pregnancy. 1 dose of Td every 10 years.  Varicella vaccine.** / Consult your health care provider. Pregnant females who do not have evidence of immunity should receive the first dose after pregnancy.  Zoster vaccine.** / 1 dose for adults aged 30 years or older.  Measles, mumps, rubella (MMR) vaccine.** / You need at least 1 dose of MMR if you were born in 1957 or later. You may also need a second dose. For females of childbearing age, rubella immunity should be determined. If there is no evidence of immunity, females who are not pregnant should be vaccinated. If there is no evidence of immunity, females who are pregnant should delay immunization until after pregnancy.  Pneumococcal 13-valent conjugate (PCV13) vaccine.** / Consult your health care provider.  Pneumococcal polysaccharide (PPSV23) vaccine.** / 1 to 2 doses if you smoke cigarettes or if you have certain conditions.  Meningococcal vaccine.** /  Consult your health care provider.  Hepatitis A vaccine.** / Consult your health care provider.  Hepatitis B vaccine.** / Consult your health care provider.  Haemophilus influenzae type b (Hib) vaccine.** / Consult your health care provider. Ages 64 years and over  Blood pressure check.** / Every year.  Lipid and cholesterol check.** / Every 5 years beginning at age 23 years.  Lung cancer screening. / Every year if you  are aged 16-80 years and have a 30-pack-year history of smoking and currently smoke or have quit within the past 15 years. Yearly screening is stopped once you have quit smoking for at least 15 years or develop a health problem that would prevent you from having lung cancer treatment.  Clinical breast exam.** / Every year after age 74 years.  BRCA-related cancer risk assessment.** / For women who have family members with a BRCA-related cancer (breast, ovarian, tubal, or peritoneal cancers).  Mammogram.** / Every year beginning at age 44 years and continuing for as long as you are in good health. Consult with your health care provider.  Pap test.** / Every 3 years starting at age 58 years through age 22 or 39 years with 3 consecutive normal Pap tests. Testing can be stopped between 65 and 70 years with 3 consecutive normal Pap tests and no abnormal Pap or HPV tests in the past 10 years.  HPV screening.** / Every 3 years from ages 64 years through ages 70 or 61 years with a history of 3 consecutive normal Pap tests. Testing can be stopped between 65 and 70 years with 3 consecutive normal Pap tests and no abnormal Pap or HPV tests in the past 10 years.  Fecal occult blood test (FOBT) of stool. / Every year beginning at age 40 years and continuing until age 27 years. You may not need to do this test if you get a colonoscopy every 10 years.  Flexible sigmoidoscopy or colonoscopy.** / Every 5 years for a flexible sigmoidoscopy or every 10 years for a colonoscopy beginning at age 7 years and continuing until age 32 years.  Hepatitis C blood test.** / For all people born from [birth year] through 1965 and any individual with known risks for hepatitis C.  Osteoporosis screening.** / A one-time screening for women ages 30 years and over and women at risk for fractures or osteoporosis.  Skin self-exam. / Monthly.  Influenza vaccine. / Every year.  Tetanus, diphtheria, and acellular pertussis (Tdap/Td)  vaccine.** / 1 dose of Td every 10 years.  Varicella vaccine.** / Consult your health care provider.  Zoster vaccine.** / 1 dose for adults aged 35 years or older.  Pneumococcal 13-valent conjugate (PCV13) vaccine.** / Consult your health care provider.  Pneumococcal polysaccharide (PPSV23) vaccine.** / 1 dose for all adults aged 46 years and older.  Meningococcal vaccine.** / Consult your health care provider.  Hepatitis A vaccine.** / Consult your health care provider.  Hepatitis B vaccine.** / Consult your health care provider.  Haemophilus influenzae type b (Hib) vaccine.** / Consult your health care provider. ** Family history and personal history of risk and conditions may change your health care provider's recommendations.   This information is not intended to replace advice given to you by your health care provider. Make sure you discuss any questions you have with your health care provider.   Document Released: 05/06/2001 Document Revised: 03/31/2014 Document Reviewed: 08/05/2010 Elsevier Interactive Patient Education [employer].

## 2015-07-04 NOTE — Progress Notes (Signed)
Pt here today for annual physical exam, c/o pain and fullness in her mid lower pelvic region after intercourse and fullness in her left breast.

## 2015-07-04 NOTE — Progress Notes (Signed)
GYNECOLOGY CLINIC ANNUAL PREVENTATIVE CARE ENCOUNTER NOTE  Subjective:   Gabriella Riley is a 40 y.o. [phone] female here for a routine annual gynecologic exam.  Current complaints: left breast fullness and pelvic fullness occasionally after intercourse.   Denies abnormal vaginal bleeding, discharge, pelvic pain, problems with intercourse or other gynecologic concerns.    Gynecologic History Patient's last menstrual period was 06/08/2015. Contraception: tubal ligation Last Pap: 2016. Results were: normal  Obstetric History OB History  Gravida Para Term Preterm AB SAB TAB Ectopic Multiple Living  4 4 4  0 0 0 0 0 0 4    # Outcome Date GA Lbr Len/2nd Weight Sex Delivery Anes PTL Lv  4 Term 08/22/10 [redacted]w[redacted]d  7 lb 1 oz (3.204 kg) F Vag-Spont   Y     Complications: Pre-eclampsia  3 Term 05/14/09 [redacted]w[redacted]d  8 lb 3 oz (3.714 kg) M Vag-Spont   Y     Complications: Pre-eclampsia  2 Term 06/26/00 [redacted]w[redacted]d  6 lb 11 oz (3.033 kg) M Vag-Spont   Y  1 Term 07/26/95 [redacted]w[redacted]d  7 lb 9 oz (3.43 kg) F Vag-Spont   Y      Past Medical History  Diagnosis Date  . Arthritis     Past Surgical History  Procedure Laterality Date  . Breast biopsy Left 2007  . Tubal ligation      No current outpatient prescriptions on file prior to visit.   No current facility-administered medications on file prior to visit.    Allergies  Allergen Reactions  . Latex Rash    Social History   Social History  . Marital Status: Divorced    Spouse Name: N/A  . Number of Children: N/A  . Years of Education: N/A   Occupational History  . Not on file.   Social History Main Topics  . Smoking status: Never Smoker   . Smokeless tobacco: Never Used  . Alcohol Use: 0.0 oz/week    0 Standard drinks or equivalent per week     Comment: social  . Drug Use: No  . Sexual Activity: Yes    Birth Control/ Protection: Surgical   Other Topics Concern  . Not on file   Social History Narrative    Family History  Problem  Relation Age of Onset  . Hyperlipidemia Father   . Hypertension Father   . Arthritis Maternal Grandmother     The following portions of the patient's history were reviewed and updated as appropriate: allergies, current medications, past family history, past medical history, past social history, past surgical history and problem list.  Review of Systems Pertinent items noted in HPI and remainder of comprehensive ROS otherwise negative.   Objective:  BP 149/85 mmHg  Pulse 73  Resp 18  Ht 5\' 7"  (1.702 m)  Wt 237 lb (107.502 kg)  BMI 37.11 kg/m2  LMP 06/08/2015 CONSTITUTIONAL: Well-developed, well-nourished female in no acute distress.  HENT:  Normocephalic, atraumatic, External right and left ear normal. Oropharynx is clear and moist EYES: Conjunctivae and EOM are normal. Pupils are equal, round, and reactive to light. No scleral icterus.  NECK: Normal range of motion, supple, no masses.  Normal thyroid.  SKIN: Skin is warm and dry. No rash noted. Not diaphoretic. No erythema. No pallor. NEUROLOGIC: Alert and oriented to person, place, and time. Normal reflexes, muscle tone coordination. No cranial nerve deficit noted. PSYCHIATRIC: Normal mood and affect. Normal behavior. Normal judgment and thought content. CARDIOVASCULAR: Normal heart rate noted, regular  rhythm RESPIRATORY: Clear to auscultation bilaterally. Effort and breath sounds normal, no problems with respiration noted. BREASTS: Symmetric in size. No masses, skin changes, nipple drainage, or lymphadenopathy. No tenderness bilaterally.  ABDOMEN: Soft, normal bowel sounds, no distention noted.  No tenderness, rebound or guarding.  PELVIC: Normal appearing external genitalia; normal appearing vaginal mucosa and cervix.  No abnormal discharge noted.  Pap smear obtained.  Normal uterine size, no other palpable masses, no uterine or adnexal tenderness. MUSCULOSKELETAL: Normal range of motion. No tenderness.  No cyanosis, clubbing, or  edema.  2+ distal pulses.   Assessment:  Annual gynecologic examination with pap smear Breast fullness and pelvic fullness    Plan:  Will follow up results of pap smear and manage accordingly. Reassured by normal breast exam and pelvic exam.  Encouraged to try different intercourse positions. Routine preventative health maintenance measures emphasized. Please refer to After Visit Summary for other counseling recommendations.    Verita Schneiders, MD, [place] Attending Obstetrician & Gynecologist, [place] for [facility]

## 2015-07-05 LAB — CYTOLOGY - PAP

## 2015-09-18 ENCOUNTER — Ambulatory Visit (INDEPENDENT_AMBULATORY_CARE_PROVIDER_SITE_OTHER): Payer: BC Managed Care – PPO | Admitting: Internal Medicine

## 2015-09-18 ENCOUNTER — Encounter: Payer: Self-pay | Admitting: Internal Medicine

## 2015-09-18 VITALS — BP 124/78 | HR 84 | Temp 99.6°F | Ht 67.0 in | Wt 229.5 lb

## 2015-09-18 DIAGNOSIS — J029 Acute pharyngitis, unspecified: Secondary | ICD-10-CM

## 2015-09-18 DIAGNOSIS — R509 Fever, unspecified: Secondary | ICD-10-CM

## 2015-09-18 DIAGNOSIS — R5383 Other fatigue: Secondary | ICD-10-CM | POA: Diagnosis not present

## 2015-09-18 LAB — CBC
HCT: 34 % — ABNORMAL LOW (ref 36.0–46.0)
Hemoglobin: 10.6 g/dL — ABNORMAL LOW (ref 12.0–15.0)
MCHC: 31.1 g/dL (ref 30.0–36.0)
MCV: 65.7 fl — ABNORMAL LOW (ref 78.0–100.0)
PLATELETS: 228 10*3/uL (ref 150.0–400.0)
RBC: 5.18 Mil/uL — AB (ref 3.87–5.11)
RDW: 18.4 % — ABNORMAL HIGH (ref 11.5–15.5)
WBC: 10.4 10*3/uL (ref 4.0–10.5)

## 2015-09-18 LAB — MONONUCLEOSIS SCREEN: Mono Screen: NEGATIVE

## 2015-09-18 LAB — POCT RAPID STREP A (OFFICE): RAPID STREP A SCREEN: NEGATIVE

## 2015-09-18 MED ORDER — METHYLPREDNISOLONE ACETATE 80 MG/ML IJ SUSP
80.0000 mg | Freq: Once | INTRAMUSCULAR | Status: AC
  Administered 2015-09-18: 80 mg via INTRAMUSCULAR

## 2015-09-18 NOTE — Patient Instructions (Signed)

## 2015-09-18 NOTE — Progress Notes (Signed)
Subjective:    Patient ID: Gabriella Riley, female    DOB: 10-Jul-1975, 40 y.o.   MRN: [id number]  HPI  Pt presents to the clinic today with c/o ear fullness and sore throat. This started 1 month ago. She is having difficulty swallowing. She denies runny nose, cough or chest congestion. She has run fevers. She was seen at [facility] for the same, given Penicillin x 14 days, and finished that but it did not help. She has had sick contacts.   Review of Systems      Past Medical History  Diagnosis Date  . Arthritis     No current outpatient prescriptions on file.   No current facility-administered medications for this visit.    Allergies  Allergen Reactions  . Latex Rash    Family History  Problem Relation Age of Onset  . Hyperlipidemia Father   . Hypertension Father   . Arthritis Maternal Grandmother     Social History   Social History  . Marital Status: Divorced    Spouse Name: N/A  . Number of Children: N/A  . Years of Education: N/A   Occupational History  . Not on file.   Social History Main Topics  . Smoking status: Never Smoker   . Smokeless tobacco: Never Used  . Alcohol Use: 0.0 oz/week    0 Standard drinks or equivalent per week     Comment: social  . Drug Use: No  . Sexual Activity: Yes    Birth Control/ Protection: Surgical   Other Topics Concern  . Not on file   Social History Narrative     Constitutional: Pt reports fatigue and fever. Denies fever, malaise, headache or abrupt weight changes.  HEENT: pt reports ear fullness and sore throat. Denies eye pain, eye redness, ear pain, ringing in the ears, wax buildup, runny nose, nasal congestion, bloody nose. Respiratory: Denies difficulty breathing, shortness of breath, cough or sputum production.   Cardiovascular: Denies chest pain, chest tightness, palpitations or swelling in the hands or feet.    No other specific complaints in a complete review of systems (except as listed in HPI  above).  Objective:   Physical Exam  BP 124/78 mmHg  Pulse 84  Temp(Src) 99.6 F (37.6 C) (Oral)  Ht _0  (1.702 m)  Wt 229 lb 8 oz (104.101 kg)  BMI 35.94 kg/m2  SpO2 99%  LMP 08/29/2015 Wt Readings from Last 3 Encounters:  09/18/15 229 lb 8 oz (104.101 kg)  07/04/15 237 lb (107.502 kg)  06/03/15 237 lb (107.502 kg)    General: Appears her stated age, obese in NAD. Skin: Warm, dry and intact. No rashes noted. HEENT: Head: normal shape and size, no sinus tenderness noted; Ears: Tm's gray and intact, normal light reflex;  Throat/Mouth: Teeth present, mucosa pink and moist, no exudate, lesions or ulcerations noted. Tonsils 2+. Neck:  Cervical adenopathy noted. Cardiovascular: Normal rate and rhythm. S1,S2 noted.  No murmur, rubs or gallops noted.  Pulmonary/Chest: Normal effort and positive vesicular breath sounds. No respiratory distress. No wheezes, rales or ronchi noted.   BMET    Component Value Date/Time   NA 137 05/14/2009 0921   K 3.4* 05/14/2009 0921   CL 107 05/14/2009 0921   CO2 23 05/14/2009 0921   GLUCOSE 76 05/14/2009 0921   BUN 3* 05/14/2009 0921   CREATININE 0.62 05/14/2009 0921   CALCIUM 8.2* 05/14/2009 0921   GFRNONAA >60 05/14/2009 0921   GFRAA  05/14/2009 0921    >  60        The eGFR has been calculated using the MDRD equation. This calculation has not been validated in all clinical situations. eGFR's persistently <60 mL/min signify possible Chronic Kidney Disease.    Lipid Panel  No results found for: CHOL, TRIG, HDL, CHOLHDL, VLDL, LDLCALC  CBC    Component Value Date/Time   WBC 13.7* 05/15/2009 0555   RBC 3.63* 05/15/2009 0555   HGB 8.0* 05/15/2009 0555   HCT 24.9* 05/15/2009 0555   PLT 198 05/15/2009 0555   MCV 68.5* 05/15/2009 0555   MCHC 32.2 05/15/2009 0555   RDW 16.1* 05/15/2009 0555    Hgb A1C No results found for: HGBA1C       Assessment & Plan:   Sore throat, fatigue and fever:  RST: negative Will send throat  culture 80 mg Depo IM today Will check CBC and Mono Spot Advil and salt water gargles as needed  Will follow up after labs, RTC as needed Webb Silversmith, NP

## 2015-09-18 NOTE — Progress Notes (Signed)
Pre visit review using our clinic review tool, if applicable. No additional management support is needed unless otherwise documented below in the visit note. 

## 2015-09-20 LAB — CULTURE, GROUP A STREP: ORGANISM ID, BACTERIA: NORMAL

## 2015-12-18 ENCOUNTER — Ambulatory Visit: Payer: BC Managed Care – PPO | Admitting: Internal Medicine

## 2015-12-21 ENCOUNTER — Ambulatory Visit: Payer: BC Managed Care – PPO | Admitting: Internal Medicine

## 2016-03-28 ENCOUNTER — Ambulatory Visit: Payer: BC Managed Care – PPO | Admitting: Internal Medicine

## 2016-03-28 ENCOUNTER — Ambulatory Visit (INDEPENDENT_AMBULATORY_CARE_PROVIDER_SITE_OTHER): Payer: BC Managed Care – PPO | Admitting: Internal Medicine

## 2016-03-28 ENCOUNTER — Encounter: Payer: Self-pay | Admitting: Internal Medicine

## 2016-03-28 VITALS — BP 124/80 | HR 73 | Temp 99.0°F | Wt 231.0 lb

## 2016-03-28 DIAGNOSIS — N644 Mastodynia: Secondary | ICD-10-CM | POA: Diagnosis not present

## 2016-03-28 NOTE — Patient Instructions (Signed)
Breast Tenderness Breast tenderness is a common problem for women of all ages. Breast tenderness may cause mild discomfort to severe pain. The pain usually comes and goes in association with your menstrual cycle, but it can be constant. Breast tenderness has many possible causes, including hormone changes and some medicines. Your health care provider may order tests, such as a mammogram or an ultrasound, to check for any unusual findings. Having breast tenderness usually does not mean that you have breast cancer. Follow these instructions at home: Sometimes, reassurance that you do not have breast cancer is all that is needed. In general, follow these home care instructions: Managing pain and discomfort  If directed, apply ice to the area:  Put ice in a plastic bag.  Place a towel between your skin and the bag.  Leave the ice on for 20 minutes, 2-3 times a day.  Make sure you are wearing a supportive bra, especially during exercise. You may also want to wear a supportive bra while sleeping if your breasts are very tender. Medicines  Take over-the-counter and prescription medicines only as told by your health care provider. If the cause of your pain is infection, you may be prescribed an antibiotic medicine.  If you were prescribed an antibiotic, take it as told by your health care provider. Do not stop taking the antibiotic even if you start to feel better. General instructions  Your health care provider may recommend that you reduce the amount of fat in your diet. You can do this by:  Limiting fried foods.  Cooking foods using methods, such as baking, boiling, grilling, and broiling.  Decrease the amount of caffeine in your diet. You can do this by drinking more water and choosing caffeine-free options.  Keep a log of the days and times when your breasts are most tender.  Ask your health care provider how to do breast exams at home. This will help you notice if you have an unusual  growth or lump. Contact a health care provider if:  Any part of your breast is hard, red, and hot to the touch. This may be a sign of infection.  You are not breastfeeding and you have fluid, especially blood or pus, coming out of your nipples.  You have a fever.  You have a new or painful lump in your breast that remains after your menstrual period ends.  Your pain does not improve or it gets worse.  Your pain is interfering with your daily activities. This information is not intended to replace advice given to you by your health care provider. Make sure you discuss any questions you have with your health care provider. Document Released: 02/21/2008 Document Revised: 12/07/2015 Document Reviewed: 12/07/2015 Elsevier Interactive Patient Education  2017 Elsevier Inc.  

## 2016-03-28 NOTE — Addendum Note (Signed)
Addended by: Lurlean Nanny on: 03/28/2016 03:35 PM   Modules accepted: Orders

## 2016-03-28 NOTE — Progress Notes (Signed)
Subjective:    Patient ID: Gabriella Riley, female    DOB: Jul 04, 1975, 41 y.o.   MRN: [id number]  HPI  Pt presents to the clinic today with c/o left breast pain. This started 10 days ago. She describes the pain as a pulling sensation. It starts just above the nipple. The area is tender to touch but she denies redness or warmth of the area. She has not noticed any discharge from the nipple. She has never had a mammogram. She has no family history of breast cancer.  Review of Systems  Past Medical History:  Diagnosis Date  . Arthritis     No current outpatient prescriptions on file.   No current facility-administered medications for this visit.     Allergies  Allergen Reactions  . Latex Rash    Family History  Problem Relation Age of Onset  . Hyperlipidemia Father   . Hypertension Father   . Arthritis Maternal Grandmother     Social History   Social History  . Marital status: Divorced    Spouse name: N/A  . Number of children: N/A  . Years of education: N/A   Occupational History  . Not on file.   Social History Main Topics  . Smoking status: Never Smoker  . Smokeless tobacco: Never Used  . Alcohol use 0.0 oz/week     Comment: social  . Drug use: No  . Sexual activity: Yes    Birth control/ protection: Surgical   Other Topics Concern  . Not on file   Social History Narrative  . No narrative on file     Constitutional: Denies fever, malaise, fatigue, headache or abrupt weight changes.  Skin: Pt reports breast pain. Denies redness, rashes, lesions or ulcercations.    No other specific complaints in a complete review of systems (except as listed in HPI above).     Objective:   Physical Exam   BP 124/80   Pulse 73   Temp 99 F (37.2 C) (Oral)   Wt 231 lb (104.8 kg)   LMP 03/03/2016   SpO2 99%   BMI 36.18 kg/m  Wt Readings from Last 3 Encounters:  03/28/16 231 lb (104.8 kg)  09/18/15 229 lb 8 oz (104.1 kg)  07/04/15 237 lb (107.5 kg)     General: Appears her stated age, in NAD. Skin: Warm, dry and intact. No skin changes noted of either breast. Breast: Breasts symmetrical. Fibrocystic changes noted of bilateral breasts. Tenderness noted at 10 o'clock just above the nipple on the left.  Not able to express any discharge from the nipple.   BMET    Component Value Date/Time   NA 137 05/14/2009 0921   K 3.4 (L) 05/14/2009 0921   CL 107 05/14/2009 0921   CO2 23 05/14/2009 0921   GLUCOSE 76 05/14/2009 0921   BUN 3 (L) 05/14/2009 0921   CREATININE 0.62 05/14/2009 0921   CALCIUM 8.2 (L) 05/14/2009 0921   GFRNONAA >60 05/14/2009 0921   GFRAA  05/14/2009 0921    >60        The eGFR has been calculated using the MDRD equation. This calculation has not been validated in all clinical situations. eGFR's persistently <60 mL/min signify possible Chronic Kidney Disease.    Lipid Panel  No results found for: CHOL, TRIG, HDL, CHOLHDL, VLDL, LDLCALC  CBC    Component Value Date/Time   WBC 10.4 09/18/2015 1146   RBC 5.18 (H) 09/18/2015 1146   HGB 10.6 (L) 09/18/2015 1146  HCT 34.0 (L) 09/18/2015 1146   PLT 228.0 09/18/2015 1146   MCV 65.7 Repeated and verified X2. (L) 09/18/2015 1146   MCHC 31.1 09/18/2015 1146   RDW 18.4 (H) 09/18/2015 1146    Hgb A1C No results found for: HGBA1C         Assessment & Plan:   Left breast pain:  Likely just a cystic changes Discussed Ibuprofen, warm compresses, and cutting out caffeine Mammogram and ultrasound ordered today  Will follow up after imaging, RTC as needed Webb Silversmith, NP

## 2016-04-04 ENCOUNTER — Ambulatory Visit
Admission: RE | Admit: 2016-04-04 | Discharge: 2016-04-04 | Disposition: A | Discharge status: Home / Self Care | Payer: BC Managed Care – PPO | Source: Ambulatory Visit | Attending: Internal Medicine | Admitting: Internal Medicine

## 2016-04-04 ENCOUNTER — Other Ambulatory Visit: Payer: Self-pay | Admitting: Internal Medicine

## 2016-04-04 DIAGNOSIS — N6310 Unspecified lump in the right breast, unspecified quadrant: Secondary | ICD-10-CM

## 2016-04-04 DIAGNOSIS — N644 Mastodynia: Secondary | ICD-10-CM

## 2016-05-12 ENCOUNTER — Telehealth: Payer: Self-pay | Admitting: Internal Medicine

## 2016-05-12 NOTE — Telephone Encounter (Signed)
Patient LM on triage VM line requesting refill of  clindamycin (CLEOCIN) 300 MG capsule [id number] To [facility] [place].  Best number to call is [phone]

## 2016-05-13 ENCOUNTER — Encounter: Payer: Self-pay | Admitting: Internal Medicine

## 2016-05-13 MED ORDER — FLUCONAZOLE 150 MG PO TABS: 150.0000 mg | ORAL_TABLET | Freq: Once | ORAL | 0 refills | Status: AC

## 2016-05-13 NOTE — Telephone Encounter (Signed)
Message sent through my chart

## 2016-05-13 NOTE — Telephone Encounter (Signed)
Refill of this med is not appropriate

## 2016-05-21 ENCOUNTER — Ambulatory Visit: Payer: BC Managed Care – PPO | Admitting: Primary Care

## 2016-05-26 ENCOUNTER — Ambulatory Visit: Payer: BC Managed Care – PPO | Admitting: Internal Medicine

## 2016-05-26 DIAGNOSIS — Z0289 Encounter for other administrative examinations: Secondary | ICD-10-CM

## 2016-05-27 ENCOUNTER — Telehealth: Payer: Self-pay | Admitting: Internal Medicine

## 2016-05-27 NOTE — Telephone Encounter (Signed)
Pt did not show up for their acute visit.  Do you want to charge No Show Fee? °

## 2016-05-27 NOTE — Telephone Encounter (Signed)
Yes I want to charge a no show fee.

## 2016-07-08 ENCOUNTER — Ambulatory Visit: Payer: BC Managed Care – PPO | Admitting: Obstetrics & Gynecology

## 2016-07-09 ENCOUNTER — Ambulatory Visit (INDEPENDENT_AMBULATORY_CARE_PROVIDER_SITE_OTHER): Payer: BC Managed Care – PPO | Admitting: Obstetrics & Gynecology

## 2016-07-09 ENCOUNTER — Encounter: Payer: Self-pay | Admitting: Obstetrics & Gynecology

## 2016-07-09 ENCOUNTER — Ambulatory Visit: Payer: BC Managed Care – PPO | Admitting: Obstetrics & Gynecology

## 2016-07-09 VITALS — BP 132/75 | HR 73 | Ht 67.0 in | Wt 235.0 lb

## 2016-07-09 DIAGNOSIS — Z01419 Encounter for gynecological examination (general) (routine) without abnormal findings: Secondary | ICD-10-CM

## 2016-07-09 NOTE — Progress Notes (Signed)
Subjective:    Gabriella Riley is a 41 y.o. M AA P4 (82, 81, 31 and 49 yo kids)  female who presents for an annual exam. The patient has no complaints today. The patient is sexually active. GYN screening history: last pap: was normal. The patient wears seatbelts: yes. The patient participates in regular exercise: yes. Has the patient ever been transfused or tattooed?: yes. (tatoo) The patient reports that there is not domestic violence in her life.   Menstrual History: OB History    Gravida Para Term Preterm AB Living   4 4 4  0 0 4   SAB TAB Ectopic Multiple Live Births   0 0 0 0 4      Menarche age: 41 Patient's last menstrual period was 06/19/2016.    The following portions of the patient's history were reviewed and updated as appropriate: allergies, current medications, past family history, past medical history, past social history, past surgical history and problem list.  Review of Systems Pertinent items are noted in HPI.   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   Objective:    BP 132/75   Pulse 73   Ht 5\' 7"  (1.702 m)   Wt 235 lb (106.6 kg)   LMP 06/19/2016   BMI 36.81 kg/m   General Appearance:    Alert, cooperative, no distress, appears stated age  Head:    Normocephalic, without obvious abnormality, atraumatic  Eyes:    PERRL, conjunctiva/corneas clear, EOM's intact, fundi    benign, both eyes  Ears:    Normal TM's and external ear canals, both ears  Nose:   Nares normal, septum midline, mucosa normal, no drainage    or sinus tenderness  Throat:   Lips, mucosa, and tongue normal; teeth and gums normal  Neck:   Supple, symmetrical, trachea midline, no adenopathy;    thyroid:  no enlargement/tenderness/nodules; no carotid   bruit or JVD  Back:     Symmetric, no curvature, ROM normal, no CVA tenderness  Lungs:     Clear to auscultation bilaterally,  respirations unlabored  Chest Wall:    No tenderness or deformity   Heart:    Regular rate and rhythm, S1 and S2 normal, no murmur, rub   or gallop  Breast Exam:    No tenderness, masses, or nipple abnormality  Abdomen:     Soft, non-tender, bowel sounds active all four quadrants,    no masses, no organomegaly  Genitalia:    Normal female without lesion, discharge or tenderness, NSSA, NT, mobile, normal adnexal exam     Extremities:   Extremities normal, atraumatic, no cyanosis or edema  Pulses:   2+ and symmetric all extremities  Skin:   Skin color, texture, turgor normal, no rashes or lesions  Lymph nodes:   Cervical, supraclavicular, and axillary nodes normal  Neurologic:   CNII-XII intact, normal strength, sensation and reflexes    throughout  .    Assessment:    Healthy female exam.    Plan:     Breast self exam technique reviewed and patient encouraged to perform self-exam monthly. Thin prep Pap smear. with cotesting Rec healthy lifestyle

## 2016-07-11 LAB — CYTOLOGY - PAP
Diagnosis: NEGATIVE
HPV: NOT DETECTED

## 2016-08-08 ENCOUNTER — Encounter: Payer: Self-pay | Admitting: Internal Medicine

## 2016-08-08 ENCOUNTER — Ambulatory Visit (INDEPENDENT_AMBULATORY_CARE_PROVIDER_SITE_OTHER): Payer: BC Managed Care – PPO | Admitting: Internal Medicine

## 2016-08-08 VITALS — BP 124/82 | HR 67 | Temp 98.1°F | Wt 236.5 lb

## 2016-08-08 DIAGNOSIS — G8929 Other chronic pain: Secondary | ICD-10-CM

## 2016-08-08 DIAGNOSIS — M25562 Pain in left knee: Secondary | ICD-10-CM

## 2016-08-08 DIAGNOSIS — M25561 Pain in right knee: Secondary | ICD-10-CM

## 2016-08-08 DIAGNOSIS — R635 Abnormal weight gain: Secondary | ICD-10-CM

## 2016-08-08 MED ORDER — PHENTERMINE HCL 15 MG PO CAPS
15.0000 mg | ORAL_CAPSULE | ORAL | 0 refills | Status: DC
Start: 2016-08-08 — End: 2016-09-10

## 2016-08-08 NOTE — Progress Notes (Signed)
Subjective:    Patient ID: Gabriella Riley, female    DOB: 06-Jan-1976, 41 y.o.   MRN: [id number]  HPI  Pt presents to the clinic today to discuss weight loss. She reports she has been gaining weight and she is not sure why. She has been making changes to her diet. She has tried Weight Watchers, Keto diet- both with minimal weight loss. She feels like she is able to start the day off good, but as the day progresses, she loses motivations and "eats everything in site". She tries to exercise but feels like she is unable to secondary to knee pain.  Breakfast: Boiled egg and protein shake Lunch: Fast food Dinner: Fried chicken, starch and vegetables Snacks: Cupcake, cookies  Review of Systems      Past Medical History:  Diagnosis Date  . Arthritis     No current outpatient prescriptions on file.   No current facility-administered medications for this visit.     Allergies  Allergen Reactions  . Latex Rash    Family History  Problem Relation Age of Onset  . Hyperlipidemia Father   . Hypertension Father   . Arthritis Maternal Grandmother     Social History   Social History  . Marital status: Divorced    Spouse name: N/A  . Number of children: N/A  . Years of education: N/A   Occupational History  . Not on file.   Social History Main Topics  . Smoking status: Never Smoker  . Smokeless tobacco: Never Used  . Alcohol use 0.0 oz/week     Comment: social  . Drug use: No  . Sexual activity: Yes    Birth control/ protection: None   Other Topics Concern  . Not on file   Social History Narrative  . No narrative on file     Constitutional: Pt reports weight gain. Denies fever, malaise, fatigue, headache.  Musculoskeletal: Pt reports bilateral knee pain. Denies decrease in range of motion, difficulty with gait, muscle pain or joint swelling.    No other specific complaints in a complete review of systems (except as listed in HPI above).  Objective:   Physical  Exam   BP 124/82   Pulse 67   Temp 98.1 F (36.7 C) (Oral)   Wt 236 lb 8 oz (107.3 kg)   LMP 08/04/2016   SpO2 99%   BMI 37.04 kg/m  Wt Readings from Last 3 Encounters:  08/08/16 236 lb 8 oz (107.3 kg)  07/09/16 235 lb (106.6 kg)  03/28/16 231 lb (104.8 kg)    General: Appears her stated age, obese in NAD. Musculoskeletal: Normal flexion and extension of the knees. No signs of joint swelling. No difficulty with gait.    BMET    Component Value Date/Time   NA 137 05/14/2009 0921   K 3.4 (L) 05/14/2009 0921   CL 107 05/14/2009 0921   CO2 23 05/14/2009 0921   GLUCOSE 76 05/14/2009 0921   BUN 3 (L) 05/14/2009 0921   CREATININE 0.62 05/14/2009 0921   CALCIUM 8.2 (L) 05/14/2009 0921   GFRNONAA >60 05/14/2009 0921   GFRAA  05/14/2009 0921    >60        The eGFR has been calculated using the MDRD equation. This calculation has not been validated in all clinical situations. eGFR's persistently <60 mL/min signify possible Chronic Kidney Disease.    Lipid Panel  No results found for: CHOL, TRIG, HDL, CHOLHDL, VLDL, LDLCALC  CBC    Component  Value Date/Time   WBC 10.4 09/18/2015 1146   RBC 5.18 (H) 09/18/2015 1146   HGB 10.6 (L) 09/18/2015 1146   HCT 34.0 (L) 09/18/2015 1146   PLT 228.0 09/18/2015 1146   MCV 65.7 Repeated and verified X2. (L) 09/18/2015 1146   MCHC 31.1 09/18/2015 1146   RDW 18.4 (H) 09/18/2015 1146    Hgb A1C No results found for: HGBA1C         Assessment & Plan:   Abnormal Weight Gain:  Discussed the importance of high protein, low carb diet Advised her not to go by the numbers on the scale but by how she feels and how her clothes fit Discussed the role of exercise Will start Phentermine 15 mg tabs, 1 daily  Bilateral Knee Pain:  Discussed how weight loss will help take some of the pressure off her knees Ibuprofen as needed Water aerobics or swimming would be good exercises for her  RTC in 1 month for weight check/med  refill Webb Silversmith, NP

## 2016-08-11 NOTE — Patient Instructions (Signed)

## 2016-09-04 ENCOUNTER — Other Ambulatory Visit: Payer: Self-pay | Admitting: Internal Medicine

## 2016-09-04 DIAGNOSIS — N6320 Unspecified lump in the left breast, unspecified quadrant: Secondary | ICD-10-CM

## 2016-09-08 ENCOUNTER — Ambulatory Visit: Payer: BC Managed Care – PPO | Admitting: Internal Medicine

## 2016-09-10 ENCOUNTER — Encounter: Payer: Self-pay | Admitting: Internal Medicine

## 2016-09-10 ENCOUNTER — Ambulatory Visit (INDEPENDENT_AMBULATORY_CARE_PROVIDER_SITE_OTHER): Payer: BC Managed Care – PPO | Admitting: Internal Medicine

## 2016-09-10 VITALS — BP 124/80 | HR 72 | Temp 98.2°F | Wt 227.8 lb

## 2016-09-10 DIAGNOSIS — R635 Abnormal weight gain: Secondary | ICD-10-CM

## 2016-09-10 MED ORDER — PHENTERMINE HCL 15 MG PO CAPS: 15.0000 mg | ORAL_CAPSULE | ORAL | 0 refills | Status: DC

## 2016-09-10 NOTE — Patient Instructions (Signed)
Exercising to Lose Weight Exercising can help you to lose weight. In order to lose weight through exercise, you need to do vigorous-intensity exercise. You can tell that you are exercising with vigorous intensity if you are breathing very hard and fast and cannot hold a conversation while exercising. Moderate-intensity exercise helps to maintain your current weight. You can tell that you are exercising at a moderate level if you have a higher heart rate and faster breathing, but you are still able to hold a conversation. How often should I exercise? Choose an activity that you enjoy and set realistic goals. Your health care provider can help you to make an activity plan that works for you. Exercise regularly as directed by your health care provider. This may include:  Doing resistance training twice each week, such as: ? Push-ups. ? Sit-ups. ? Lifting weights. ? Using resistance bands.  Doing a given intensity of exercise for a given amount of time. Choose from these options: ? 150 minutes of moderate-intensity exercise every week. ? 75 minutes of vigorous-intensity exercise every week. ? A mix of moderate-intensity and vigorous-intensity exercise every week.  Children, pregnant women, people who are out of shape, people who are overweight, and older adults may need to consult a health care provider for individual recommendations. If you have any sort of medical condition, be sure to consult your health care provider before starting a new exercise program. What are some activities that can help me to lose weight?  Walking at a rate of at least 4.5 miles an hour.  Jogging or running at a rate of 5 miles per hour.  Biking at a rate of at least 10 miles per hour.  Lap swimming.  Roller-skating or in-line skating.  Cross-country skiing.  Vigorous competitive sports, such as football, basketball, and soccer.  Jumping rope.  Aerobic dancing. How can I be more active in my day-to-day  activities?  Use the stairs instead of the elevator.  Take a walk during your lunch break.  If you drive, park your car farther away from work or school.  If you take public transportation, get off one stop early and walk the rest of the way.  Make all of your phone calls while standing up and walking around.  Get up, stretch, and walk around every 30 minutes throughout the day. What guidelines should I follow while exercising?  Do not exercise so much that you hurt yourself, feel dizzy, or get very short of breath.  Consult your health care provider prior to starting a new exercise program.  Wear comfortable clothes and shoes with good support.  Drink plenty of water while you exercise to prevent dehydration or heat stroke. Body water is lost during exercise and must be replaced.  Work out until you breathe faster and your heart beats faster. This information is not intended to replace advice given to you by your health care provider. Make sure you discuss any questions you have with your health care provider. Document Released: 04/12/2010 Document Revised: 08/16/2015 Document Reviewed: 08/11/2013 Elsevier Interactive Patient Education  2018 [employer] Inc.  

## 2016-09-10 NOTE — Progress Notes (Signed)
Subjective:    Patient ID: Gabriella Riley, female    DOB: [birth year], 41 y.o.   MRN: [id number]  HPI  Pt presents to the clinic today for 1 month follow up for weight check and med refill. She was started on Phentermine 15 mg daily at her last visit. She has been taking the medication as prescribed. She reports she still feels hungry on the current dose. Her starting weight was 236.5. Her weight today is 227.75 lbs with a BMI of 35.67.  Breakfast: fruit Lunch: weight watcher meals Dinner: meat and veggies or sphagetti Snacks: nuts, trail mix Exercise: none currently, but she plans on joining the [employer]  Review of Systems      Past Medical History:  Diagnosis Date  . Arthritis     Current Outpatient Prescriptions  Medication Sig Dispense Refill  . phentermine 15 MG capsule Take 1 capsule (15 mg total) by mouth every morning. 30 capsule 0   No current facility-administered medications for this visit.     Allergies  Allergen Reactions  . Latex Rash    Family History  Problem Relation Age of Onset  . Hyperlipidemia Father   . Hypertension Father   . Arthritis Maternal Grandmother     Social History   Social History  . Marital status: Divorced    Spouse name: N/A  . Number of children: N/A  . Years of education: N/A   Occupational History  . Not on file.   Social History Main Topics  . Smoking status: Never Smoker  . Smokeless tobacco: Never Used  . Alcohol use 0.0 oz/week     Comment: social  . Drug use: No  . Sexual activity: Yes    Birth control/ protection: None   Other Topics Concern  . Not on file   Social History Narrative  . No narrative on file     Constitutional: Denies fever, malaise, fatigue, headache or abrupt weight changes.  Cardiovascular: Denies chest pain, chest tightness, palpitations or swelling in the hands or feet.  Neurological: Denies dizziness, difficulty with memory, difficulty with speech or problems with balance and  coordination.    No other specific complaints in a complete review of systems (except as listed in HPI above).  Objective:   Physical Exam   BP 124/80   Pulse 72   Temp 98.2 F (36.8 C) (Oral)   Wt 227 lb 12 oz (103.3 kg)   LMP 08/30/2016   SpO2 98%   BMI 35.67 kg/m  Wt Readings from Last 3 Encounters:  09/10/16 227 lb 12 oz (103.3 kg)  08/08/16 236 lb 8 oz (107.3 kg)  07/09/16 235 lb (106.6 kg)    General: Appears her stated age, obese in NAD. Cardiovascular: Normal rate and rhythm.  Pulmonary/Chest: Normal effort and positive vesicular breath sounds. No respiratory distress. No wheezes, rales or ronchi noted.  Neurological: Alert and oriented. BMET    Component Value Date/Time   NA 137 05/14/2009 0921   K 3.4 (L) 05/14/2009 0921   CL 107 05/14/2009 0921   CO2 23 05/14/2009 0921   GLUCOSE 76 05/14/2009 0921   BUN 3 (L) 05/14/2009 0921   CREATININE 0.62 05/14/2009 0921   CALCIUM 8.2 (L) 05/14/2009 0921   GFRNONAA >60 05/14/2009 0921   GFRAA  05/14/2009 0921    >60        The eGFR has been calculated using the MDRD equation. This calculation has not been validated in all clinical situations. eGFR's persistently <  60 mL/min signify possible Chronic Kidney Disease.    Lipid Panel  No results found for: CHOL, TRIG, HDL, CHOLHDL, VLDL, LDLCALC  CBC    Component Value Date/Time   WBC 10.4 09/18/2015 1146   RBC 5.18 (H) 09/18/2015 1146   HGB 10.6 (L) 09/18/2015 1146   HCT 34.0 (L) 09/18/2015 1146   PLT 228.0 09/18/2015 1146   MCV 65.7 Repeated and verified X2. (L) 09/18/2015 1146   MCHC 31.1 09/18/2015 1146   RDW 18.4 (H) 09/18/2015 1146    Hgb A1C No results found for: HGBA1C         Assessment & Plan:   Abnormal Weight Gain:  Will continue with Phentermine 15 mg daily for now, RX provided today Encouraged her to continue to try to cut back on the carbs, add more protein to breakfast Encouraged her to join the [facility] like she is planning  RTC  in 1 month for weight check/med refill Webb Silversmith, NP

## 2016-10-02 ENCOUNTER — Telehealth: Payer: Self-pay | Admitting: *Deleted

## 2016-10-02 DIAGNOSIS — B373 Candidiasis of vulva and vagina: Secondary | ICD-10-CM

## 2016-10-02 DIAGNOSIS — B3731 Acute candidiasis of vulva and vagina: Secondary | ICD-10-CM

## 2016-10-02 MED ORDER — FLUCONAZOLE 150 MG PO TABS: 150.0000 mg | ORAL_TABLET | Freq: Once | ORAL | 0 refills | Status: AC

## 2016-10-02 NOTE — Telephone Encounter (Signed)
-----   Message from Blanchie Dessert, [place] sent at 10/02/2016  1:36 PM EDT ----- Regarding: Rx request Contact: [phone] Diflucan sent to [facility] in [facility]

## 2016-10-02 NOTE — Telephone Encounter (Signed)
Pt c/o vaginal itching similar to a yeast infection. Diflucan sent to pharmacy.

## 2016-10-03 ENCOUNTER — Other Ambulatory Visit: Payer: Self-pay | Admitting: Internal Medicine

## 2016-10-03 ENCOUNTER — Ambulatory Visit
Admission: RE | Admit: 2016-10-03 | Discharge: 2016-10-03 | Disposition: A | Discharge status: Home / Self Care | Payer: BC Managed Care – PPO | Source: Ambulatory Visit | Attending: Internal Medicine | Admitting: Internal Medicine

## 2016-10-03 DIAGNOSIS — N6320 Unspecified lump in the left breast, unspecified quadrant: Secondary | ICD-10-CM

## 2016-10-03 DIAGNOSIS — N63 Unspecified lump in unspecified breast: Secondary | ICD-10-CM

## 2016-10-10 ENCOUNTER — Ambulatory Visit (INDEPENDENT_AMBULATORY_CARE_PROVIDER_SITE_OTHER): Payer: BC Managed Care – PPO | Admitting: Internal Medicine

## 2016-10-10 ENCOUNTER — Encounter: Payer: Self-pay | Admitting: Internal Medicine

## 2016-10-10 VITALS — BP 124/80 | HR 81 | Temp 98.4°F | Wt 227.0 lb

## 2016-10-10 DIAGNOSIS — R635 Abnormal weight gain: Secondary | ICD-10-CM | POA: Diagnosis not present

## 2016-10-10 MED ORDER — PHENTERMINE HCL 37.5 MG PO CAPS: 37.5000 mg | ORAL_CAPSULE | ORAL | 0 refills | Status: DC

## 2016-10-10 NOTE — Patient Instructions (Signed)
Protein Supplement Powders What are protein supplement powders? Protein is one of the major components of the human diet. It helps your body to build tissue, muscle, red blood cells, enzymes, antibodies, and hormones. Athletes need to include more protein in their diets than nonathletes do. Athletes place more stress on their muscles, and extra protein helps to repair these muscles. In female athletes, extra protein also helps to maintain regular menstrual cycles. Most of an athlete's daily protein needs can be met through food. However, protein supplement powders are a safe and effective way to increase daily protein intake, particularly if you:  Eat a vegan diet.  Are actively trying to build muscle mass.  Are growing.  Are recovering from injury.  Protein supplement powders are supplements that contain large amounts of protein. They can be easily digested, absorbed, and used by the body. These proteins contain the essential building blocks (amino acids) that are central to muscle function, repair, and recovery. Protein supplement powders most often consist of:  Animal-based proteins, including: ? Whey. ? Casein. ? Milk. ? Egg.  Vegetable-based proteins, including soy.  How do I use protein supplement powders? Many protein supplement powders need to be mixed with water. Some products come premixed in a liquid form. Specific proportions and directions vary by product. Follow the directions that are provided on the label. Eat carbohydrates and protein, either in food or supplement form, before and after an exercise session. Consuming a protein supplement powder before lifting weights or doing resistance training helps to build muscles (protein synthesis) and may limit muscle damage during exercise. Eating a protein supplement powder within three hours after exercise can maximize muscle protein synthesis. Before and after a workout, eat protein in a ratio of one protein for every three  carbohydrates. For example, one serving of protein powder that contains 20 grams of protein should be consumed with 60 grams of carbohydrates. This is equivalent to one banana and one piece of toast. How much protein supplement powder should I take? Follow the instructions on the product label for recommended serving sizes and daily servings. Protein supplement powders should be taken in addition to, not in place of, a balanced diet to meet daily protein needs. Daily protein recommendations for adults are as follows:  Average adults should consume around 0.36 grams of protein daily per pound of body weight. For example, a 150-pound adult should consume approximately 54 grams of protein each day.  Athletes should consume 0.45-0.73 grams of protein daily per pound of body weight. For example, a 150-pound athlete should consume 67.5-109.5 grams of protein each day.  What are the risks of taking protein supplement powders? The risks of taking a protein supplement powder may vary depending on the type of protein in the supplement. Risks may include:  Allergies. ? If you have an allergy to cow's milk, avoid protein supplements that contain milk, whey, or casein. ? If you have an allergy to soy, avoid protein supplements that contain soy or lecithin.  Extra stress on your kidneys, if you have kidney disease or diabetes.  If you eat too much protein, or if your protein intake is not balanced with carbohydrates and other nutrients, you may have:  Gastrointestinal discomfort.  Constipation.  Diarrhea.  Tissue dehydration.  Nausea.  Headache.  Fatigue.  What are some tips for using protein supplement powders?  Always talk with your health care provider before starting protein supplement powders.  Eat a balanced diet of fruits, vegetables, meat, dairy, and grains,  in addition to taking a protein supplement powder.  Drink enough fluid to keep your urine clear or pale yellow. Drink fluids  before, during, and after exercise and throughout the day.  Take protein supplements only as directed. Do not take a higher dose than was recommended for you. Protein supplement powders are not regulated. Some products may contain impurities or ingredients that differ from those on the label.  Try to meet your daily protein needs with food. Eat protein-rich foods along with protein supplement powders. Foods high in protein include ? Lean meat, poultry, and fish. ? Eggs. ? Milk, yogurt, and cheese. ? Soybeans and tofu. ? Beans and lentils. This information is not intended to replace advice given to you by your health care provider. Make sure you discuss any questions you have with your health care provider. Document Released: 03/10/2005 Document Revised: 02/05/2016 Document Reviewed: 08/23/2013 Elsevier Interactive Patient Education  2017 Elsevier Inc.  

## 2016-10-10 NOTE — Progress Notes (Signed)
Subjective:    Patient ID: Gabriella Riley, female    DOB: 12-03-1975, 41 y.o.   MRN: [id number]  HPI  Pt presents to the clinic today for 1 month follow up for weight check and med refill. She was started on Phentermine 07/2016. Her starting weight was 236.5 lbs. Her weight today is 227 lbs with a BMI of 35.55. She did not lose any weight over the last month.  Breakfast: Protein shakes Lunch: She often skips lunch Dinner: Salmon and broccoli Snacks: nuts Exercise: She has been going to the gym, doing 60 minutes of cardio, 2 days per week. On days she does not go to the gym, she takes walks around her neighborhood.  Review of Systems      Past Medical History:  Diagnosis Date  . Arthritis     Current Outpatient Prescriptions  Medication Sig Dispense Refill  . phentermine 15 MG capsule Take 1 capsule (15 mg total) by mouth every morning. 30 capsule 0   No current facility-administered medications for this visit.     Allergies  Allergen Reactions  . Latex Rash    Family History  Problem Relation Age of Onset  . Hyperlipidemia Father   . Hypertension Father   . Arthritis Maternal Grandmother     Social History   Social History  . Marital status: Divorced    Spouse name: N/A  . Number of children: N/A  . Years of education: N/A   Occupational History  . Not on file.   Social History Main Topics  . Smoking status: Never Smoker  . Smokeless tobacco: Never Used  . Alcohol use 0.0 oz/week     Comment: social  . Drug use: No  . Sexual activity: Yes    Birth control/ protection: None   Other Topics Concern  . Not on file   Social History Narrative  . No narrative on file     Constitutional: Denies fever, malaise, fatigue, headache or abrupt weight changes.  Respiratory: Denies difficulty breathing, shortness of breath, cough or sputum production.   Cardiovascular: Denies chest pain, chest tightness, palpitations or swelling in the hands or feet.   No  other specific complaints in a complete review of systems (except as listed in HPI above).  Objective:   Physical Exam  BP 124/80   Pulse 81   Temp 98.4 F (36.9 C) (Oral)   Wt 227 lb (103 kg)   SpO2 98%   BMI 35.55 kg/m  Wt Readings from Last 3 Encounters:  10/10/16 227 lb (103 kg)  09/10/16 227 lb 12 oz (103.3 kg)  08/08/16 236 lb 8 oz (107.3 kg)    General: Appears her stated age, obese in NAD. Cardiovascular: Normal rate and rhythm. Pulmonary/Chest: Normal effort and positive vesicular breath sounds. No respiratory distress. No wheezes, rales or ronchi noted.  Neurological: Alert and oriented.   BMET    Component Value Date/Time   NA 137 05/14/2009 0921   K 3.4 (L) 05/14/2009 0921   CL 107 05/14/2009 0921   CO2 23 05/14/2009 0921   GLUCOSE 76 05/14/2009 0921   BUN 3 (L) 05/14/2009 0921   CREATININE 0.62 05/14/2009 0921   CALCIUM 8.2 (L) 05/14/2009 0921   GFRNONAA >60 05/14/2009 0921   GFRAA  05/14/2009 0921    >60        The eGFR has been calculated using the MDRD equation. This calculation has not been validated in all clinical situations. eGFR's persistently <60 mL/min signify  possible Chronic Kidney Disease.    Lipid Panel  No results found for: CHOL, TRIG, HDL, CHOLHDL, VLDL, LDLCALC  CBC    Component Value Date/Time   WBC 10.4 09/18/2015 1146   RBC 5.18 (H) 09/18/2015 1146   HGB 10.6 (L) 09/18/2015 1146   HCT 34.0 (L) 09/18/2015 1146   PLT 228.0 09/18/2015 1146   MCV 65.7 Repeated and verified X2. (L) 09/18/2015 1146   MCHC 31.1 09/18/2015 1146   RDW 18.4 (H) 09/18/2015 1146    Hgb A1C No results found for: HGBA1C          Assessment & Plan:   Abnormal Weight Gain:  I do not think she is consuming enough calories Advised her to start eating something for lunch Make sure she is getting 6 small meals daily Increase the days she is going to the gym by an extra 1 day per week Increase Phentermine to 37.5 mg daily  RTC in 1 month  for weight check/med refill Webb Silversmith, NP

## 2016-10-21 ENCOUNTER — Encounter: Payer: Self-pay | Admitting: Internal Medicine

## 2016-10-21 ENCOUNTER — Ambulatory Visit (INDEPENDENT_AMBULATORY_CARE_PROVIDER_SITE_OTHER): Payer: BC Managed Care – PPO | Admitting: Internal Medicine

## 2016-10-21 VITALS — BP 122/82 | HR 70 | Temp 98.5°F | Wt 234.0 lb

## 2016-10-21 DIAGNOSIS — H6982 Other specified disorders of Eustachian tube, left ear: Secondary | ICD-10-CM

## 2016-10-21 MED ORDER — PREDNISONE 10 MG PO TABS: ORAL_TABLET | ORAL | 0 refills | Status: DC

## 2016-10-21 NOTE — Progress Notes (Signed)
Subjective:    Patient ID: Gabriella Riley, female    DOB: Apr 23, 1975, 41 y.o.   MRN: [id number]  HPI  Pt presents to the clinic today with c/o left ear fullness. She reports this started 4-5 days ago. She describes the pain as achy and full, but it can be sharp and radiate down the left side of her neck. She denies decreased hearing or ringing in the ears. She denies runny nose, nasal congestion, sore throat or cough. She has tried Tylenol OTC with minimal relief.   Review of Systems      Past Medical History:  Diagnosis Date  . Arthritis     Current Outpatient Prescriptions  Medication Sig Dispense Refill  . phentermine 37.5 MG capsule Take 1 capsule (37.5 mg total) by mouth every morning. 30 capsule 0   No current facility-administered medications for this visit.     Allergies  Allergen Reactions  . Latex Rash    Family History  Problem Relation Age of Onset  . Hyperlipidemia Father   . Hypertension Father   . Arthritis Maternal Grandmother     Social History   Social History  . Marital status: Divorced    Spouse name: N/A  . Number of children: N/A  . Years of education: N/A   Occupational History  . Not on file.   Social History Main Topics  . Smoking status: Never Smoker  . Smokeless tobacco: Never Used  . Alcohol use 0.0 oz/week     Comment: social  . Drug use: No  . Sexual activity: Yes    Birth control/ protection: None   Other Topics Concern  . Not on file   Social History Narrative  . No narrative on file     Constitutional: Denies fever, malaise, fatigue, headache or abrupt weight changes.  HEENT: Pt reports left ear pain. Denies eye pain, eye redness, ringing in the ears, wax buildup, runny nose, nasal congestion, bloody nose, or sore throat. Respiratory: Denies difficulty breathing, shortness of breath, cough or sputum production.    No other specific complaints in a complete review of systems (except as listed in HPI  above).  Objective:   Physical Exam  BP 122/82   Pulse 70   Temp 98.5 F (36.9 C) (Oral)   Wt 234 lb (106.1 kg)   SpO2 99%   BMI 36.65 kg/m  Wt Readings from Last 3 Encounters:  10/21/16 234 lb (106.1 kg)  10/10/16 227 lb (103 kg)  09/10/16 227 lb 12 oz (103.3 kg)    General: Appears her stated age, obese in NAD. HEENT: Left Ear: Tm's gray and intact, normal light reflex, + serous effusion;  Neck:  No adenopathy noted.   BMET    Component Value Date/Time   NA 137 05/14/2009 0921   K 3.4 (L) 05/14/2009 0921   CL 107 05/14/2009 0921   CO2 23 05/14/2009 0921   GLUCOSE 76 05/14/2009 0921   BUN 3 (L) 05/14/2009 0921   CREATININE 0.62 05/14/2009 0921   CALCIUM 8.2 (L) 05/14/2009 0921   GFRNONAA >60 05/14/2009 0921   GFRAA  05/14/2009 0921    >60        The eGFR has been calculated using the MDRD equation. This calculation has not been validated in all clinical situations. eGFR's persistently <60 mL/min signify possible Chronic Kidney Disease.    Lipid Panel  No results found for: CHOL, TRIG, HDL, CHOLHDL, VLDL, LDLCALC  CBC    Component Value Date/Time  WBC 10.4 09/18/2015 1146   RBC 5.18 (H) 09/18/2015 1146   HGB 10.6 (L) 09/18/2015 1146   HCT 34.0 (L) 09/18/2015 1146   PLT 228.0 09/18/2015 1146   MCV 65.7 Repeated and verified X2. (L) 09/18/2015 1146   MCHC 31.1 09/18/2015 1146   RDW 18.4 (H) 09/18/2015 1146    Hgb A1C No results found for: HGBA1C          Assessment & Plan:   ETD, Left:  She refuses to try intranasal steroid, reports she just can't squirt anything up her nose eRx for Pred Taper x 6 days  Return precautions discussed Webb Silversmith, NP

## 2016-10-21 NOTE — Patient Instructions (Signed)
Barotitis Media Barotitis media is inflammation of the middle ear. This condition occurs when an auditory tube (eustachian tube) is blocked in one or both ears. These tubes lead from the middle ear to the back of the nose (nasopharynx). This condition typically occurs when you experience changes in pressure, such as when flying or scuba diving. Untreated barotitis media may lead to damage or hearing loss (barotrauma), which may become permanent. What are the causes? This condition may be caused by changes in air pressure from:  Flying.  Scuba diving.  A nearby explosion. What increases the risk? The following factors may make you more likely to develop this condition:  Middle ear infection.  Sinus infection.  A cold.  Environmental allergies.  Small eustachian tubes.  Recent ear surgery. What are the signs or symptoms? Symptoms of this condition may include:  Ear pain.  Hearing loss. In severe cases, symptoms can include:  Dizziness and nausea (vertigo).  Temporary facial paralysis. How is this diagnosed? This condition is diagnosed based on:  A physical exam. Your health care provider may:  Use a device (otoscope) to look into your ear canal and check your eardrum.  Do a test that changes air pressure in the middle ear to check how well the eardrum moves and to see if the eustachian tube is working(tympanogram).  Your medical history. In some cases, your health care provider may have you take a hearing test. You may also be referred to someone who specializes in ear treatment (otolaryngologist, "ENT"). How is this treated? This condition may be treated with:  Medicines to relieve congestion in your nose, sinus, or upper respiratory tract (decongestants).  Techniques to equalize pressure (to "pop" your ears), such as:  Yawning.  Chewing gum.  Swallowing. In severe cases, you may need surgery to relieve your symptoms or to prevent future inflammation. Follow  these instructions at home:  Take over-the-counter and prescription medicines only as told by your health care provider.  Do not put anything into your ears to clean or unplug them. Ear drops will not help.  Keep all follow-up visits as told by your health care provider. This is important. How is this prevented? Using these strategies may help to prevent barotitis media:  Chewing gum with frequent, forceful swallowing during takeoff and landing when flying.  Holding your nose and gently blowing to pop your ears for equalizing pressure changes. This forces air into the eustachian tube.  Yawning during air pressure changes.  Using a nasal decongestant about 30-60 minutes before flying, if you have nasal congestion. Contact a health care provider if:  You have vertigo.  You have hearing loss.  Your symptoms do not get better or they get worse.  You have a fever. Get help right away if:  You have a severe headache, ear pain, and dizziness.  You have balance problems.  You cannot move or feel part of your face.  You have bloody or pus-like drainage from your ears. Summary  Barotitis media is inflammation of the middle ear.  This condition typically occurs when you experience changes in pressure, such as when flying or scuba diving.  You may be at a higher risk for this condition if you have small eustachian tubes, had recent ear surgery, or have allergies, a cold, or sinus or middle ear infection.  This condition may be treated with medicines or techniques to equalize pressure in your ears.  Strategies can be used to help prevent barotitis media. This information is   not intended to replace advice given to you by your health care provider. Make sure you discuss any questions you have with your health care provider. Document Released: 03/07/2000 Document Revised: 01/28/2016 Document Reviewed: 01/28/2016 Elsevier Interactive Patient Education  2017 [employer]  

## 2016-11-07 ENCOUNTER — Ambulatory Visit (INDEPENDENT_AMBULATORY_CARE_PROVIDER_SITE_OTHER): Payer: BC Managed Care – PPO | Admitting: Internal Medicine

## 2016-11-07 ENCOUNTER — Encounter: Payer: Self-pay | Admitting: Internal Medicine

## 2016-11-07 VITALS — BP 126/82 | HR 102 | Temp 98.2°F | Wt 225.0 lb

## 2016-11-07 DIAGNOSIS — H6982 Other specified disorders of Eustachian tube, left ear: Secondary | ICD-10-CM | POA: Diagnosis not present

## 2016-11-07 DIAGNOSIS — G44209 Tension-type headache, unspecified, not intractable: Secondary | ICD-10-CM | POA: Diagnosis not present

## 2016-11-07 NOTE — Patient Instructions (Signed)
Barotitis Media Barotitis media is inflammation of the middle ear. This condition occurs when an auditory tube (eustachian tube) is blocked in one or both ears. These tubes lead from the middle ear to the back of the nose (nasopharynx). This condition typically occurs when you experience changes in pressure, such as when flying or scuba diving. Untreated barotitis media may lead to damage or hearing loss (barotrauma), which may become permanent. What are the causes? This condition may be caused by changes in air pressure from:  Flying.  Scuba diving.  A nearby explosion. What increases the risk? The following factors may make you more likely to develop this condition:  Middle ear infection.  Sinus infection.  A cold.  Environmental allergies.  Small eustachian tubes.  Recent ear surgery. What are the signs or symptoms? Symptoms of this condition may include:  Ear pain.  Hearing loss. In severe cases, symptoms can include:  Dizziness and nausea (vertigo).  Temporary facial paralysis. How is this diagnosed? This condition is diagnosed based on:  A physical exam. Your health care provider may:  Use a device (otoscope) to look into your ear canal and check your eardrum.  Do a test that changes air pressure in the middle ear to check how well the eardrum moves and to see if the eustachian tube is working(tympanogram).  Your medical history. In some cases, your health care provider may have you take a hearing test. You may also be referred to someone who specializes in ear treatment (otolaryngologist, "ENT"). How is this treated? This condition may be treated with:  Medicines to relieve congestion in your nose, sinus, or upper respiratory tract (decongestants).  Techniques to equalize pressure (to "pop" your ears), such as:  Yawning.  Chewing gum.  Swallowing. In severe cases, you may need surgery to relieve your symptoms or to prevent future inflammation. Follow  these instructions at home:  Take over-the-counter and prescription medicines only as told by your health care provider.  Do not put anything into your ears to clean or unplug them. Ear drops will not help.  Keep all follow-up visits as told by your health care provider. This is important. How is this prevented? Using these strategies may help to prevent barotitis media:  Chewing gum with frequent, forceful swallowing during takeoff and landing when flying.  Holding your nose and gently blowing to pop your ears for equalizing pressure changes. This forces air into the eustachian tube.  Yawning during air pressure changes.  Using a nasal decongestant about 30-60 minutes before flying, if you have nasal congestion. Contact a health care provider if:  You have vertigo.  You have hearing loss.  Your symptoms do not get better or they get worse.  You have a fever. Get help right away if:  You have a severe headache, ear pain, and dizziness.  You have balance problems.  You cannot move or feel part of your face.  You have bloody or pus-like drainage from your ears. Summary  Barotitis media is inflammation of the middle ear.  This condition typically occurs when you experience changes in pressure, such as when flying or scuba diving.  You may be at a higher risk for this condition if you have small eustachian tubes, had recent ear surgery, or have allergies, a cold, or sinus or middle ear infection.  This condition may be treated with medicines or techniques to equalize pressure in your ears.  Strategies can be used to help prevent barotitis media. This information is   not intended to replace advice given to you by your health care provider. Make sure you discuss any questions you have with your health care provider. Document Released: 03/07/2000 Document Revised: 01/28/2016 Document Reviewed: 01/28/2016 Elsevier Interactive Patient Education  2017 [employer]  

## 2016-11-07 NOTE — Progress Notes (Signed)
Subjective:    Patient ID: Gabriella Riley, female    DOB: 02/11/1976, 41 y.o.   MRN: [id number]  HPI  Pt presents to the clinic today with c/o ear pain and headache. The headache is located in the left base of her head. She describes the pain as throbbing. She feels like the headache makes her ear pain worse. She denies changes in vision or hearing or lightheadedness. She was seen 2 weeks ago for the same. She refused intranasal steroid at that time, so she was placed on Prednisone for 6 days. She reports the Prednisone helped, but as soon as she came off it, she felt the pain in her ear again. She has not tried anything OTC for this.  Review of Systems      Past Medical History:  Diagnosis Date  . Arthritis     Current Outpatient Prescriptions  Medication Sig Dispense Refill  . phentermine 37.5 MG capsule Take 1 capsule (37.5 mg total) by mouth every morning. 30 capsule 0  . predniSONE (DELTASONE) 10 MG tablet Take 6 tabs day 1, 5 tabs day 2, 4 tabs day 3, 3 tabs day 4, 2 tabs day 5, 1 tab day 6 21 tablet 0   No current facility-administered medications for this visit.     Allergies  Allergen Reactions  . Latex Rash    Family History  Problem Relation Age of Onset  . Hyperlipidemia Father   . Hypertension Father   . Arthritis Maternal Grandmother     Social History   Social History  . Marital status: Divorced    Spouse name: N/A  . Number of children: N/A  . Years of education: N/A   Occupational History  . Not on file.   Social History Main Topics  . Smoking status: Never Smoker  . Smokeless tobacco: Never Used  . Alcohol use 0.0 oz/week     Comment: social  . Drug use: No  . Sexual activity: Yes    Birth control/ protection: None   Other Topics Concern  . Not on file   Social History Narrative  . No narrative on file     Constitutional: Pt reports headache. Denies fever, malaise, fatigue, or abrupt weight changes.  HEENT: Pt reports ear pain.  Denies eye pain, eye redness, ringing in the ears, wax buildup, runny nose, nasal congestion, bloody nose, or sore throat. Respiratory: Denies difficulty breathing, shortness of breath, cough or sputum production.    No other specific complaints in a complete review of systems (except as listed in HPI above).  Objective:   Physical Exam   BP 126/82   Pulse (!) 102   Temp 98.2 F (36.8 C) (Oral)   Wt 225 lb (102.1 kg)   SpO2 99%   BMI 35.24 kg/m  Wt Readings from Last 3 Encounters:  11/07/16 225 lb (102.1 kg)  10/21/16 234 lb (106.1 kg)  10/10/16 227 lb (103 kg)    General: Appears her stated age, in NAD. HEENT:  Left Ears: Tm's gray and intact, normal light reflex, + serous effusion on the left Neurological: Alert and oriented. Coordination normal.    BMET    Component Value Date/Time   NA 137 05/14/2009 0921   K 3.4 (L) 05/14/2009 0921   CL 107 05/14/2009 0921   CO2 23 05/14/2009 0921   GLUCOSE 76 05/14/2009 0921   BUN 3 (L) 05/14/2009 0921   CREATININE 0.62 05/14/2009 0921   CALCIUM 8.2 (L) 05/14/2009 2751  GFRNONAA >60 05/14/2009 0921   GFRAA  05/14/2009 0921    >60        The eGFR has been calculated using the MDRD equation. This calculation has not been validated in all clinical situations. eGFR's persistently <60 mL/min signify possible Chronic Kidney Disease.    Lipid Panel  No results found for: CHOL, TRIG, HDL, CHOLHDL, VLDL, LDLCALC  CBC    Component Value Date/Time   WBC 10.4 09/18/2015 1146   RBC 5.18 (H) 09/18/2015 1146   HGB 10.6 (L) 09/18/2015 1146   HCT 34.0 (L) 09/18/2015 1146   PLT 228.0 09/18/2015 1146   MCV 65.7 Repeated and verified X2. (L) 09/18/2015 1146   MCHC 31.1 09/18/2015 1146   RDW 18.4 (H) 09/18/2015 1146    Hgb A1C No results found for: HGBA1C         Assessment & Plan:   ETD, Left:  At this point, we can not repeat Prednisone Advised her to try Flonase Respimat OTC If no improvement, will refer to  ENT  Headache:  Tension related Discussed stretching, heat, massage and Ibuprofen  RTC as needed or if symptoms persist or worsen Hero Mccathern, NP

## 2016-11-10 ENCOUNTER — Ambulatory Visit: Payer: BC Managed Care – PPO | Admitting: Internal Medicine

## 2016-11-14 ENCOUNTER — Ambulatory Visit: Payer: BC Managed Care – PPO | Admitting: Internal Medicine

## 2016-11-14 NOTE — Progress Notes (Deleted)
   Subjective:    Patient ID: Gabriella Riley, female    DOB: 03-02-1976, 41 y.o.   MRN: [id number]  HPI  Pt presents to the clinic today for 1 month follow up for weight check/med refill. She was started on Phentermine 07/2016. Her starting weight was 236.5 lbs. Her weight today is with a BMI of.  Breakfast: Lunch: Dinner: Snacks: Exercise:  Review of Systems      Past Medical History:  Diagnosis Date  . Arthritis     Current Outpatient Prescriptions  Medication Sig Dispense Refill  . phentermine 37.5 MG capsule Take 1 capsule (37.5 mg total) by mouth every morning. 30 capsule 0   No current facility-administered medications for this visit.     Allergies  Allergen Reactions  . Latex Rash    Family History  Problem Relation Age of Onset  . Hyperlipidemia Father   . Hypertension Father   . Arthritis Maternal Grandmother     Social History   Social History  . Marital status: Divorced    Spouse name: N/A  . Number of children: N/A  . Years of education: N/A   Occupational History  . Not on file.   Social History Main Topics  . Smoking status: Never Smoker  . Smokeless tobacco: Never Used  . Alcohol use 0.0 oz/week     Comment: social  . Drug use: No  . Sexual activity: Yes    Birth control/ protection: None   Other Topics Concern  . Not on file   Social History Narrative  . No narrative on file     Constitutional: Denies fever, malaise, fatigue, headache or abrupt weight changes.  HEENT: Denies eye pain, eye redness, ear pain, ringing in the ears, wax buildup, runny nose, nasal congestion, bloody nose, or sore throat. Respiratory: Denies difficulty breathing, shortness of breath, cough or sputum production.   Cardiovascular: Denies chest pain, chest tightness, palpitations or swelling in the hands or feet.  Gastrointestinal: Denies abdominal pain, bloating, constipation, diarrhea or blood in the stool.  GU: Denies urgency, frequency, pain with  urination, burning sensation, blood in urine, odor or discharge. Musculoskeletal: Denies decrease in range of motion, difficulty with gait, muscle pain or joint pain and swelling.  Skin: Denies redness, rashes, lesions or ulcercations.  Neurological: Denies dizziness, difficulty with memory, difficulty with speech or problems with balance and coordination.  Psych: Denies anxiety, depression, SI/HI.  No other specific complaints in a complete review of systems (except as listed in HPI above).  Objective:   Physical Exam        Assessment & Plan:

## 2016-11-21 ENCOUNTER — Ambulatory Visit: Payer: BC Managed Care – PPO | Admitting: Internal Medicine

## 2016-11-21 NOTE — Progress Notes (Deleted)
   Subjective:    Patient ID: Gabriella Riley, female    DOB: [birth year], 41 y.o.   MRN: [id number]  HPI  Pt presents to the clinic today for 1 month follow up of weight check and med refill. She was started on Phentermine 07/2016. Her starting weight was 236.5 lbs. She has been taking the medication as prescribed. She denies adverse side effects. Her weight today is lbs with a BMI of.  Breakfast: Lunch: Dinner: Snacks: Exercise:  Review of Systems      Past Medical History:  Diagnosis Date  . Arthritis     Current Outpatient Prescriptions  Medication Sig Dispense Refill  . phentermine 37.5 MG capsule Take 1 capsule (37.5 mg total) by mouth every morning. 30 capsule 0   No current facility-administered medications for this visit.     Allergies  Allergen Reactions  . Latex Rash    Family History  Problem Relation Age of Onset  . Hyperlipidemia Father   . Hypertension Father   . Arthritis Maternal Grandmother     Social History   Social History  . Marital status: Divorced    Spouse name: N/A  . Number of children: N/A  . Years of education: N/A   Occupational History  . Not on file.   Social History Main Topics  . Smoking status: Never Smoker  . Smokeless tobacco: Never Used  . Alcohol use 0.0 oz/week     Comment: social  . Drug use: No  . Sexual activity: Yes    Birth control/ protection: None   Other Topics Concern  . Not on file   Social History Narrative  . No narrative on file     Constitutional: Denies fever, malaise, fatigue, headache or abrupt weight changes.  HEENT: Denies eye pain, eye redness, ear pain, ringing in the ears, wax buildup, runny nose, nasal congestion, bloody nose, or sore throat. Respiratory: Denies difficulty breathing, shortness of breath, cough or sputum production.   Cardiovascular: Denies chest pain, chest tightness, palpitations or swelling in the hands or feet.  Gastrointestinal: Denies abdominal pain, bloating,  constipation, diarrhea or blood in the stool.  GU: Denies urgency, frequency, pain with urination, burning sensation, blood in urine, odor or discharge. Musculoskeletal: Denies decrease in range of motion, difficulty with gait, muscle pain or joint pain and swelling.  Skin: Denies redness, rashes, lesions or ulcercations.  Neurological: Denies dizziness, difficulty with memory, difficulty with speech or problems with balance and coordination.  Psych: Denies anxiety, depression, SI/HI.  No other specific complaints in a complete review of systems (except as listed in HPI above).  Objective:   Physical Exam        Assessment & Plan:

## 2016-12-30 ENCOUNTER — Telehealth: Payer: Self-pay | Admitting: Internal Medicine

## 2016-12-30 NOTE — Telephone Encounter (Signed)
Patient Name: Gabriella Riley  DOB: [birth year]    Initial Comment Caller states ear was bleeding today, it has stopped. She had fluid issues in the past. -- She was told if more issues arise, she would be referred to an ENT.    Nurse Assessment  Nurse: Joline Salt, RN, Malachy Mood Date/Time Eilene Ghazi Time): 12/30/2016 2:53:36 PM  Confirm and document reason for call. If symptomatic, describe symptoms. ---Caller states left ear was bleeding today, it has stopped. She had fluid issues in the past. -- She was told if more issues arise, she would be referred to an ENT. States she cleaned her ear with a Qtip last night. When she woke up she notice a small amount of red blood on the tissue. Her ear has been aching for the last 2 weeks. No fever. Felt like she was getting a cold last week but didn't.  Does the patient have any new or worsening symptoms? ---Yes  Will a triage be completed? ---Yes  Related visit to physician within the last 2 weeks? ---N/A  Does the PT have any chronic conditions? (i.e. diabetes, asthma, etc.) ---No  Is the patient pregnant or possibly pregnant? (Ask all females between the ages of 84-55) ---No  Is this a behavioral health or substance abuse call? ---No     Guidelines    Guideline Title Affirmed Question Affirmed Notes  Ear - Discharge Unexplained bleeding from ear    Final Disposition User   See Physician within 24 Hours [place], [place], Malachy Mood    Comments  Caller is going to [place] and will go to an Urgent Care in [place].   Referrals  [facility] Disagree/Comply Comply  Caller Understands Yes  PreDisposition Go to Urgent Care/Walk-In Clinic

## 2016-12-31 NOTE — Telephone Encounter (Signed)
noted 

## 2017-01-05 ENCOUNTER — Encounter: Payer: Self-pay | Admitting: Family Medicine

## 2017-01-05 ENCOUNTER — Ambulatory Visit (INDEPENDENT_AMBULATORY_CARE_PROVIDER_SITE_OTHER): Payer: BC Managed Care – PPO | Admitting: Family Medicine

## 2017-01-05 VITALS — BP 130/88 | HR 89 | Temp 98.3°F | Wt 228.8 lb

## 2017-01-05 DIAGNOSIS — H9202 Otalgia, left ear: Secondary | ICD-10-CM

## 2017-01-05 NOTE — Progress Notes (Signed)
   Subjective:    Patient ID: Gabriella Riley, female    DOB: 1975-10-16, 41 y.o.   MRN: [id number]  HPI This is a 41 yo female who presents today with 1 week of left ear pain. 6 days ago she had left ear pain and noticed some drainage that was blood tinged. Had used a cotton swab days prior. Some nagging pain, not enough to take something. Was flying recently with some pain on initial take off, less with return flight.  In Jan. She was given prednisone for fluid behind ear. Had significant pain at that time, not nearly as bad now.  No URI/allergy symptoms- no rhinorrhea, sinus pain or pressure. No recent swimming.   Past Medical History:  Diagnosis Date  . Arthritis    Past Surgical History:  Procedure Laterality Date  . BREAST BIOPSY Left 2007  . TUBAL LIGATION     Family History  Problem Relation Age of Onset  . Hyperlipidemia Father   . Hypertension Father   . Arthritis Maternal Grandmother    Social History  Substance Use Topics  . Smoking status: Never Smoker  . Smokeless tobacco: Never Used  . Alcohol use 0.0 oz/week     Comment: social      Review of Systems Per HPI    Objective:   Physical Exam  Constitutional: She is oriented to person, place, and time. She appears well-developed and well-nourished. No distress.  HENT:  Head: Normocephalic and atraumatic.  Right Ear: Tympanic membrane, external ear and ear canal normal.  Left Ear: External ear normal.  Left canal mildly edematous, still good visualization of TM- shiny, landmarks visible. Small amount moist cerumen.   Eyes: Conjunctivae are normal.  Cardiovascular: Normal rate.   Pulmonary/Chest: Effort normal.  Neurological: She is alert and oriented to person, place, and time.  Skin: Skin is warm and dry. She is not diaphoretic.  Psychiatric: She has a normal mood and affect. Her behavior is normal. Judgment and thought content normal.  Vitals reviewed.     BP 130/88 (BP Location: Left Arm, Patient  Position: Sitting, Cuff Size: Large)   Pulse 89   Temp 98.3 F (36.8 C) (Oral)   Wt 228 lb 12 oz (103.8 kg)   LMP 12/26/2016   SpO2 99%   BMI 35.83 kg/m  Wt Readings from Last 3 Encounters:  01/05/17 228 lb 12 oz (103.8 kg)  11/07/16 225 lb (102.1 kg)  10/21/16 234 lb (106.1 kg)       Assessment & Plan:  1. Otalgia, left ear - TM normal - some mild edema of canal- likely secondary to trauma, discussed ear hygiene and discouraged her from inserting anything in her ear, can take OTC analgesics - RTC precautions reviewed   Clarene Reamer, FNP-BC  [facility], [place]  01/05/2017 4:30 PM

## 2017-01-05 NOTE — Patient Instructions (Signed)
It was a pleasure to meet you today  Please do not put anything in your ears  When flying- use afrin or sudafed before take off  Over the counter tylenol or Advil

## 2017-04-10 ENCOUNTER — Ambulatory Visit
Admission: RE | Admit: 2017-04-10 | Discharge: 2017-04-10 | Disposition: A | Discharge status: Home / Self Care | Payer: BC Managed Care – PPO | Source: Ambulatory Visit | Attending: Internal Medicine | Admitting: Internal Medicine

## 2017-04-10 DIAGNOSIS — N63 Unspecified lump in unspecified breast: Secondary | ICD-10-CM

## 2017-05-25 ENCOUNTER — Encounter: Payer: Self-pay | Admitting: Radiology

## 2017-06-29 ENCOUNTER — Encounter: Payer: Self-pay | Admitting: Internal Medicine

## 2017-06-29 ENCOUNTER — Ambulatory Visit: Payer: BC Managed Care – PPO | Admitting: Internal Medicine

## 2017-06-29 VITALS — BP 132/84 | HR 86 | Temp 98.3°F | Wt 247.0 lb

## 2017-06-29 DIAGNOSIS — R635 Abnormal weight gain: Secondary | ICD-10-CM

## 2017-06-29 MED ORDER — PHENTERMINE HCL 37.5 MG PO CAPS: 37.5000 mg | ORAL_CAPSULE | ORAL | 0 refills | Status: DC

## 2017-06-29 NOTE — Progress Notes (Signed)
Subjective:    Patient ID: Gabriella Riley, female    DOB: September 25, 1975, 42 y.o.   MRN: [id number]  HPI  Pt presents to the clinic today with c/o abnormal weight gain. Her weight today is 247 lbs with a BMI of 38.69. She reports she has finally finished grad school, but while she was in school her diet was not great. She plans on making some healthy changes now that she has more time. She was also not exercising routinely which she plans on starting as well. She was on Phentermine in the past and would like to restart this medication today.  Review of Systems  Past Medical History:  Diagnosis Date  . Arthritis     Current Outpatient Medications  Medication Sig Dispense Refill  . phentermine 37.5 MG capsule Take 1 capsule (37.5 mg total) by mouth every morning. (Patient not taking: Reported on 01/05/2017) 30 capsule 0   No current facility-administered medications for this visit.     Allergies  Allergen Reactions  . Latex Rash    Family History  Problem Relation Age of Onset  . Hyperlipidemia Father   . Hypertension Father   . Arthritis Maternal Grandmother     Social History   Socioeconomic History  . Marital status: Divorced    Spouse name: Not on file  . Number of children: Not on file  . Years of education: Not on file  . Highest education level: Not on file  Occupational History  . Not on file  Social Needs  . Financial resource strain: Not on file  . Food insecurity:    Worry: Not on file    Inability: Not on file  . Transportation needs:    Medical: Not on file    Non-medical: Not on file  Tobacco Use  . Smoking status: Never Smoker  . Smokeless tobacco: Never Used  Substance and Sexual Activity  . Alcohol use: Yes    Alcohol/week: 0.0 oz    Comment: social  . Drug use: No  . Sexual activity: Yes    Birth control/protection: None  Lifestyle  . Physical activity:    Days per week: Not on file    Minutes per session: Not on file  . Stress: Not on  file  Relationships  . Social connections:    Talks on phone: Not on file    Gets together: Not on file    Attends religious service: Not on file    Active member of club or organization: Not on file    Attends meetings of clubs or organizations: Not on file    Relationship status: Not on file  . Intimate partner violence:    Fear of current or ex partner: Not on file    Emotionally abused: Not on file    Physically abused: Not on file    Forced sexual activity: Not on file  Other Topics Concern  . Not on file  Social History Narrative  . Not on file     Constitutional: Pt reports weight gain. Denies fever, malaise, fatigue, headache.  Neurological: Denies dizziness, difficulty with memory, difficulty with speech or problems with balance and coordination.    No other specific complaints in a complete review of systems (except as listed in HPI above).     Objective:   Physical Exam   BP 132/84   Pulse 86   Temp 98.3 F (36.8 C) (Oral)   Wt 247 lb (112 kg)   LMP 06/29/2017  SpO2 98%   BMI 38.69 kg/m  Wt Readings from Last 3 Encounters:  06/29/17 247 lb (112 kg)  01/05/17 228 lb 12 oz (103.8 kg)  11/07/16 225 lb (102.1 kg)    General: Appears her stated age, obese in NAD. Cardiovascular: Normal rate and rhythm. S1,S2 noted.  No murmur, rubs or gallops noted.  Pulmonary/Chest: Normal effort and positive vesicular breath sounds. No respiratory distress. No wheezes, rales or ronchi noted.  Neurological: Alert and oriented.    BMET    Component Value Date/Time   NA 137 05/14/2009 0921   K 3.4 (L) 05/14/2009 0921   CL 107 05/14/2009 0921   CO2 23 05/14/2009 0921   GLUCOSE 76 05/14/2009 0921   BUN 3 (L) 05/14/2009 0921   CREATININE 0.62 05/14/2009 0921   CALCIUM 8.2 (L) 05/14/2009 0921   GFRNONAA >60 05/14/2009 0921   GFRAA  05/14/2009 0921    >60        The eGFR has been calculated using the MDRD equation. This calculation has not been validated in all  clinical situations. eGFR's persistently <60 mL/min signify possible Chronic Kidney Disease.    Lipid Panel  No results found for: CHOL, TRIG, HDL, CHOLHDL, VLDL, LDLCALC  CBC    Component Value Date/Time   WBC 10.4 09/18/2015 1146   RBC 5.18 (H) 09/18/2015 1146   HGB 10.6 (L) 09/18/2015 1146   HCT 34.0 (L) 09/18/2015 1146   PLT 228.0 09/18/2015 1146   MCV 65.7 Repeated and verified X2. (L) 09/18/2015 1146   MCHC 31.1 09/18/2015 1146   RDW 18.4 (H) 09/18/2015 1146    Hgb A1C No results found for: HGBA1C         Assessment & Plan:   Abnormal Weight Gain:  Discussed the importance of balanced diet and exercise regimen eRx for Phentermine 37.5 mg daily  RTC in 1 month for weight check/med refill Webb Silversmith, NP

## 2017-07-04 ENCOUNTER — Encounter: Payer: Self-pay | Admitting: Internal Medicine

## 2017-07-04 NOTE — Patient Instructions (Signed)

## 2017-07-21 ENCOUNTER — Ambulatory Visit (INDEPENDENT_AMBULATORY_CARE_PROVIDER_SITE_OTHER): Payer: BC Managed Care – PPO | Admitting: Obstetrics & Gynecology

## 2017-07-21 ENCOUNTER — Encounter: Payer: Self-pay | Admitting: Obstetrics & Gynecology

## 2017-07-21 ENCOUNTER — Other Ambulatory Visit: Payer: Self-pay | Admitting: Obstetrics & Gynecology

## 2017-07-21 VITALS — BP 136/87 | HR 72 | Wt 245.4 lb

## 2017-07-21 DIAGNOSIS — Z23 Encounter for immunization: Secondary | ICD-10-CM | POA: Diagnosis not present

## 2017-07-21 DIAGNOSIS — Z1151 Encounter for screening for human papillomavirus (HPV): Secondary | ICD-10-CM

## 2017-07-21 DIAGNOSIS — N6310 Unspecified lump in the right breast, unspecified quadrant: Secondary | ICD-10-CM | POA: Diagnosis not present

## 2017-07-21 DIAGNOSIS — Z01419 Encounter for gynecological examination (general) (routine) without abnormal findings: Secondary | ICD-10-CM

## 2017-07-21 DIAGNOSIS — E559 Vitamin D deficiency, unspecified: Secondary | ICD-10-CM | POA: Diagnosis not present

## 2017-07-21 DIAGNOSIS — N920 Excessive and frequent menstruation with regular cycle: Secondary | ICD-10-CM

## 2017-07-21 DIAGNOSIS — Z124 Encounter for screening for malignant neoplasm of cervix: Secondary | ICD-10-CM

## 2017-07-21 NOTE — Progress Notes (Signed)
Last pap 06/2016 Mammogram 04/10/2017

## 2017-07-21 NOTE — Progress Notes (Signed)
Subjective:    Gabriella Riley is a 42 y.o. married P106female who presents for an annual exam. Her periods are very heavy, lasting 6+ days, monthly but sometimes q3 weeks.  The patient is sexually active. GYN screening history: last pap: was normal. The patient wears seatbelts: yes. The patient participates in regular exercise: no. Has the patient ever been transfused or tattooed?: yes. The patient reports that there is not domestic violence in her life.   Menstrual History: OB History    Gravida  4   Para  4   Term  4   Preterm  0   AB  0   Living  4     SAB  0   TAB  0   Ectopic  0   Multiple  0   Live Births  45           Menarche age: 72 Patient's last menstrual period was 06/29/2017.    The following portions of the patient's history were reviewed and updated as appropriate: allergies, current medications, past family history, past medical history, past social history, past surgical history and problem list.  Review of Systems Pertinent items are noted in HPI.   Fh- no breast, gyn, colon cancer Married for 8 years Had a BTL  Works for [employer], Social worker   Objective:    BP 136/87   Pulse 72   Wt 245 lb 6.4 oz (111.3 kg)   LMP 06/29/2017   BMI 38.44 kg/m   General Appearance:    Alert, cooperative, no distress, appears stated age  Head:    Normocephalic, without obvious abnormality, atraumatic  Eyes:    PERRL, conjunctiva/corneas clear, EOM's intact, fundi    benign, both eyes  Ears:    Normal TM's and external ear canals, both ears  Nose:   Nares normal, septum midline, mucosa normal, no drainage    or sinus tenderness  Throat:   Lips, mucosa, and tongue normal; teeth and gums normal  Neck:   Supple, symmetrical, trachea midline, no adenopathy;    thyroid:  no enlargement/tenderness/nodules; no carotid   bruit or JVD  Back:     Symmetric, no curvature, ROM normal, no CVA tenderness  Lungs:     Clear to auscultation bilaterally,  respirations unlabored  Chest Wall:    No tenderness or deformity   Heart:    Regular rate and rhythm, S1 and S2 normal, no murmur, rub   or gallop  Breast Exam:    No tenderness, masses, or nipple abnormality except for a 2 cm firm mass at the 10 o'clock position on her right breast  Abdomen:     Soft, non-tender, bowel sounds active all four quadrants,    no masses, no organomegaly  Genitalia:    Normal female without lesion, discharge or tenderness  Rectal:    Normal tone, normal prostate, no masses or tenderness;   guaiac negative stool  Extremities:   Extremities normal, atraumatic, no cyanosis or edema  Pulses:   2+ and symmetric all extremities  Skin:   Skin color, texture, turgor normal, no rashes or lesions  Lymph nodes:   Cervical, supraclavicular, and axillary nodes normal  Neurologic:   CNII-XII intact, normal strength, sensation and reflexes    throughout  .    Assessment:    Healthy female exam.   Right breast mass   Plan:     Pelvic ultrasound. Thin prep Pap smear. fasting labs at her convenience  cotesting for HPV with pap smear Diagnostic mammogram of right breast

## 2017-07-23 ENCOUNTER — Other Ambulatory Visit: Payer: BC Managed Care – PPO

## 2017-07-24 ENCOUNTER — Ambulatory Visit
Admission: RE | Admit: 2017-07-24 | Discharge: 2017-07-24 | Disposition: A | Discharge status: Home / Self Care | Payer: BC Managed Care – PPO | Source: Ambulatory Visit | Attending: Obstetrics & Gynecology | Admitting: Obstetrics & Gynecology

## 2017-07-24 DIAGNOSIS — N6310 Unspecified lump in the right breast, unspecified quadrant: Secondary | ICD-10-CM

## 2017-07-24 LAB — CYTOLOGY - PAP
Bacterial vaginitis: POSITIVE — AB
CANDIDA VAGINITIS: NEGATIVE
Diagnosis: NEGATIVE
HPV (WINDOPATH): NOT DETECTED

## 2017-07-29 ENCOUNTER — Ambulatory Visit (HOSPITAL_COMMUNITY): Admission: RE | Admit: 2017-07-29 | Payer: BC Managed Care – PPO | Source: Ambulatory Visit

## 2017-07-31 ENCOUNTER — Other Ambulatory Visit: Payer: Self-pay

## 2017-07-31 MED ORDER — METRONIDAZOLE 500 MG PO TABS: 500.0000 mg | ORAL_TABLET | Freq: Two times a day (BID) | ORAL | 0 refills | Status: DC

## 2017-07-31 NOTE — Telephone Encounter (Signed)
Patient tested positive for BV. Will treat with Flagyl

## 2017-09-28 ENCOUNTER — Telehealth: Payer: Self-pay

## 2017-09-28 ENCOUNTER — Other Ambulatory Visit: Payer: BC Managed Care – PPO

## 2017-09-28 DIAGNOSIS — Z0000 Encounter for general adult medical examination without abnormal findings: Secondary | ICD-10-CM

## 2017-09-28 NOTE — Telephone Encounter (Signed)
Patient presented to the office for lab work which was ordered in April.

## 2017-09-29 ENCOUNTER — Other Ambulatory Visit: Payer: Self-pay | Admitting: Obstetrics & Gynecology

## 2017-09-29 LAB — COMPREHENSIVE METABOLIC PANEL
A/G RATIO: 1.4 (ref 1.2–2.2)
ALK PHOS: 81 IU/L (ref 39–117)
ALT: 13 IU/L (ref 0–32)
AST: 19 IU/L (ref 0–40)
Albumin: 3.7 g/dL (ref 3.5–5.5)
BILIRUBIN TOTAL: 0.2 mg/dL (ref 0.0–1.2)
BUN/Creatinine Ratio: 13 (ref 9–23)
BUN: 12 mg/dL (ref 6–24)
CHLORIDE: 105 mmol/L (ref 96–106)
CO2: 21 mmol/L (ref 20–29)
Calcium: 9.3 mg/dL (ref 8.7–10.2)
Creatinine, Ser: 0.9 mg/dL (ref 0.57–1.00)
GFR calc Af Amer: 92 mL/min/{1.73_m2} (ref >59)
GFR calc non Af Amer: 80 mL/min/{1.73_m2} (ref >59)
GLUCOSE: 85 mg/dL (ref 65–99)
Globulin, Total: 2.7 g/dL (ref 1.5–4.5)
POTASSIUM: 4.2 mmol/L (ref 3.5–5.2)
Sodium: 138 mmol/L (ref 134–144)
Total Protein: 6.4 g/dL (ref 6.0–8.5)

## 2017-09-29 LAB — CBC
Hematocrit: 34 % (ref 34.0–46.6)
Hemoglobin: 10.4 g/dL — ABNORMAL LOW (ref 11.1–15.9)
MCH: 20.6 pg — AB (ref 26.6–33.0)
MCHC: 30.6 g/dL — ABNORMAL LOW (ref 31.5–35.7)
MCV: 68 fL — AB (ref 79–97)
Platelets: 294 10*3/uL (ref 150–450)
RBC: 5.04 x10E6/uL (ref 3.77–5.28)
RDW: 18.1 % — AB (ref 12.3–15.4)
WBC: 7.1 10*3/uL (ref 3.4–10.8)

## 2017-09-29 LAB — VITAMIN D 25 HYDROXY (VIT D DEFICIENCY, FRACTURES): VIT D 25 HYDROXY: 11.3 ng/mL — AB (ref 30.0–100.0)

## 2017-09-29 LAB — LIPID PANEL
CHOLESTEROL TOTAL: 185 mg/dL (ref 100–199)
Chol/HDL Ratio: 3.1 ratio (ref 0.0–4.4)
HDL: 59 mg/dL (ref >39)
LDL CALC: 113 mg/dL — AB (ref 0–99)
Triglycerides: 66 mg/dL (ref 0–149)
VLDL CHOLESTEROL CAL: 13 mg/dL (ref 5–40)

## 2017-09-29 LAB — TSH: TSH: 1.69 u[IU]/mL (ref 0.450–4.500)

## 2017-09-29 MED ORDER — VITAMIN D (ERGOCALCIFEROL) 1.25 MG (50000 UNIT) PO CAPS: 50000.0000 [IU] | ORAL_CAPSULE | ORAL | 1 refills | Status: DC

## 2017-09-29 NOTE — Progress Notes (Signed)
Vitamin D called in

## 2017-10-09 ENCOUNTER — Other Ambulatory Visit: Payer: Self-pay

## 2017-10-09 MED ORDER — VITAMIN D (ERGOCALCIFEROL) 1.25 MG (50000 UNIT) PO CAPS: 50000.0000 [IU] | ORAL_CAPSULE | ORAL | 1 refills | Status: DC

## 2018-01-06 ENCOUNTER — Other Ambulatory Visit: Payer: Self-pay | Admitting: Obstetrics & Gynecology

## 2018-01-06 DIAGNOSIS — E559 Vitamin D deficiency, unspecified: Secondary | ICD-10-CM

## 2018-01-06 NOTE — Progress Notes (Signed)
She will need a vitamin d level checked in January 2020.

## 2018-02-04 ENCOUNTER — Encounter: Payer: Self-pay | Admitting: Family Medicine

## 2018-02-04 ENCOUNTER — Ambulatory Visit: Payer: BC Managed Care – PPO | Admitting: Family Medicine

## 2018-02-04 VITALS — BP 138/90 | HR 81 | Temp 98.8°F | Resp 10 | Ht 67.0 in | Wt 252.0 lb

## 2018-02-04 DIAGNOSIS — N920 Excessive and frequent menstruation with regular cycle: Secondary | ICD-10-CM | POA: Insufficient documentation

## 2018-02-04 DIAGNOSIS — R002 Palpitations: Secondary | ICD-10-CM

## 2018-02-04 DIAGNOSIS — Z1331 Encounter for screening for depression: Secondary | ICD-10-CM | POA: Diagnosis not present

## 2018-02-04 DIAGNOSIS — E669 Obesity, unspecified: Secondary | ICD-10-CM | POA: Diagnosis not present

## 2018-02-04 DIAGNOSIS — R0609 Other forms of dyspnea: Secondary | ICD-10-CM

## 2018-02-04 DIAGNOSIS — D509 Iron deficiency anemia, unspecified: Secondary | ICD-10-CM

## 2018-02-04 NOTE — Progress Notes (Addendum)
Subjective:    Patient ID: Gabriella Riley, female    DOB: 03-16-1976, 42 y.o.   MRN: [id number]  HPI  #SOB - feels like she cannot get a breath when she takes a deep breath in - endorses DOE - with stairs, feeling out of breath - some swallowing issues, wonders if reflux - symptoms x 1.5 weeks - occasional cough - typically anytime shes goes upstairs - does not exercise  #weight gain Wt Readings from Last 3 Encounters:  02/04/18 252 lb (114.3 kg)  07/21/17 245 lb 6.4 oz (111.3 kg)  06/29/17 247 lb (112 kg)  - feels like she has been gaining weight - feels like she is eating a lot  - previous normal weight was around 215 lb about 1-2 years ago - started taking phentermine but this is not working currently   Body mass index is 39.47 kg/m.  #palpitations - lump feeling in throat - not with sob - endorses lightheadedness - sitting or laying down helps - symptoms last 1 minute - for a few months - but every 1-2 weeks, not very often - sometimes will also have palpitations - and will have to stop and calm herself down - symptoms come on spontaneous - comes from "no-where"  Anxiety/depression - dad just recently got sick - has noticed some of these getting worse - has a support system and no hx of depression/anxiety - denies anxious symptoms during episodes  #irregular menses - coming earlier than they used to  - cycle 25-28 days - bleeds 6 days, ranges from light to heavy - changes her pads about every hour - but tampon and pad - symptoms present times 1 year  Review of Systems  Constitutional: Negative for chills and fever.  HENT: Negative for congestion and sore throat.   Respiratory: Positive for cough and shortness of breath. Negative for chest tightness and wheezing.   Cardiovascular: Positive for palpitations. Negative for chest pain.  Genitourinary: Positive for menstrual problem and vaginal bleeding.  Skin: Negative.   Psychiatric/Behavioral:  Negative.        Objective:   Physical Exam  Constitutional: She appears well-developed and well-nourished. She does not appear ill. No distress.  HENT:  Head: Normocephalic and atraumatic.  Eyes: EOM are normal.  Neck: Neck supple.  Cardiovascular: Normal rate, regular rhythm and intact distal pulses.  Pulmonary/Chest: Effort normal and breath sounds normal. No bradypnea. No respiratory distress.  Musculoskeletal: Normal range of motion.       Right lower leg: She exhibits no edema.       Left lower leg: She exhibits no edema.  Lymphadenopathy:    She has no cervical adenopathy.  Neurological: She is alert.  Skin: Skin is warm and dry.  Psychiatric: She has a normal mood and affect.      BP 138/90   Pulse 81   Temp 98.8 F (37.1 C)   Resp 10   Ht 5\' 7"  (1.702 m)   Wt 252 lb (114.3 kg)   LMP 01/21/2018   SpO2 99%   BMI 39.47 kg/m     Assessment & Plan:   Gabriella Riley is a 42 yo female with hx of anemia who presents with SOB and palpitations  Problem List Items Addressed This Visit      Other   Obesity (BMI 35.0-39.9 without comorbidity)    Previously on medication but stopped taking. Encourage slow ramp-up of regular exercise as deconditioning could be factor in Gabriella Riley.  Menorrhagia with regular cycle - Primary    Change in cycle with closer together and heavier bleeding for approximately 1 year. Suspect could be related to obesity, CBC from previous year with low Hgb suspect Anemia.  - will get Pelvic US to evaluate for structural cause/uterine lining - will get repeat CBC/Ferritin  - oral iron if ferritin low      Relevant Orders   CBC   Ferritin   US Pelvis Complete   US Transvaginal Non-OB    Other Visit Diagnoses    Dyspnea on exertion       Relevant Orders   CBC   Ferritin   Palpitations       Relevant Orders   CBC   Ferritin     Suspect that the intermittent symptoms may be multifactorial - deconditioning 2/2 to obesity  and anemia given prior low Hgb and current heavy menses. GAD and PHQ-9 mildly elevated but denies significant anxiety association and has support at home.  - continue to monitor mood - orders as above  Iron deficiency anemia - stable - start iron tablets  BP Readings from Last 3 Encounters:  02/04/18 138/90  07/21/17 136/87  06/29/17 132/84   Mildly elevated BP noted after visit. Will plan to follow-up at next visit.   Gabriella Noe, MD

## 2018-02-04 NOTE — Assessment & Plan Note (Signed)
Change in cycle with closer together and heavier bleeding for approximately 1 year. Suspect could be related to obesity, CBC from previous year with low Hgb suspect Anemia.  - will get Pelvic US to evaluate for structural cause/uterine lining - will get repeat CBC/Ferritin  - oral iron if ferritin low

## 2018-02-04 NOTE — Patient Instructions (Addendum)
-   Go to the lab before you leave to get blood work - we will check for iron deficiency and anemia - get the Ultrasound done    Weight loss - I would start with trying to walk daily - hopefully the more you exercise the more your endurance will be improved  If the shortness of breath worsens or you have chest pain that does not improve return to clinic or go to the ER  If your iron is low, we will start with iron by mouth and repeat labs.

## 2018-02-04 NOTE — Assessment & Plan Note (Signed)
Previously on medication but stopped taking. Encourage slow ramp-up of regular exercise as deconditioning could be factor in [place].

## 2018-02-05 LAB — CBC
HCT: 34 % — ABNORMAL LOW (ref 36.0–46.0)
Hemoglobin: 10.7 g/dL — ABNORMAL LOW (ref 12.0–15.0)
MCHC: 31.4 g/dL (ref 30.0–36.0)
MCV: 66.3 fl — ABNORMAL LOW (ref 78.0–100.0)
Platelets: 289 10*3/uL (ref 150.0–400.0)
RBC: 5.13 Mil/uL — AB (ref 3.87–5.11)
RDW: 17.6 % — ABNORMAL HIGH (ref 11.5–15.5)
WBC: 10.8 10*3/uL — AB (ref 4.0–10.5)

## 2018-02-05 LAB — FERRITIN: Ferritin: 7.2 ng/mL — ABNORMAL LOW (ref 10.0–291.0)

## 2018-02-05 MED ORDER — FERROUS SULFATE 325 (65 FE) MG PO TABS: 325.0000 mg | ORAL_TABLET | Freq: Every day | ORAL | 3 refills | Status: DC

## 2018-02-05 NOTE — Addendum Note (Signed)
Addended by: Waunita Schooner R on: 02/05/2018 01:24 PM   Modules accepted: Orders

## 2018-02-08 ENCOUNTER — Ambulatory Visit
Admission: RE | Admit: 2018-02-08 | Discharge: 2018-02-08 | Disposition: A | Discharge status: Home / Self Care | Payer: BC Managed Care – PPO | Source: Ambulatory Visit | Attending: Family Medicine | Admitting: Family Medicine

## 2018-02-08 DIAGNOSIS — N920 Excessive and frequent menstruation with regular cycle: Secondary | ICD-10-CM

## 2018-02-11 ENCOUNTER — Ambulatory Visit (INDEPENDENT_AMBULATORY_CARE_PROVIDER_SITE_OTHER): Payer: BC Managed Care – PPO

## 2018-02-11 VITALS — BP 121/76 | HR 72

## 2018-02-11 DIAGNOSIS — Z113 Encounter for screening for infections with a predominantly sexual mode of transmission: Secondary | ICD-10-CM | POA: Diagnosis not present

## 2018-02-11 DIAGNOSIS — B373 Candidiasis of vulva and vagina: Secondary | ICD-10-CM

## 2018-02-11 DIAGNOSIS — N898 Other specified noninflammatory disorders of vagina: Secondary | ICD-10-CM | POA: Diagnosis not present

## 2018-02-11 NOTE — Progress Notes (Signed)
SUBJECTIVE:  42 y.o. female complains of vaginal discharge for a couple of days. Denies abnormal vaginal bleeding or significant pelvic pain or fever. No UTI symptoms. Denies history of known exposure to STD.  Patient's last menstrual period was 01/21/2018.  OBJECTIVE:  She appears well, afebrile. Urine dipstick: not done.  ASSESSMENT:  Vaginal Discharge small amount Vaginal Odor small amount    PLAN:  GC, chlamydia, trichomonas, BVAG, CVAG probe sent to lab. Treatment: To be determined once lab results are received ROV prn if symptoms persist or worsen.

## 2018-02-12 LAB — CERVICOVAGINAL ANCILLARY ONLY
Bacterial vaginitis: NEGATIVE
CANDIDA VAGINITIS: POSITIVE — AB
Chlamydia: POSITIVE — AB
Neisseria Gonorrhea: NEGATIVE
Trichomonas: NEGATIVE

## 2018-02-15 ENCOUNTER — Telehealth: Payer: Self-pay | Admitting: *Deleted

## 2018-02-15 MED ORDER — FLUCONAZOLE 150 MG PO TABS: 150.0000 mg | ORAL_TABLET | Freq: Once | ORAL | 3 refills | Status: AC

## 2018-02-15 MED ORDER — AZITHROMYCIN 500 MG PO TABS: 1000.0000 mg | ORAL_TABLET | Freq: Once | ORAL | 1 refills | Status: AC

## 2018-02-15 NOTE — Telephone Encounter (Signed)
Pt called requesting results from swab. Informed of positive yeast, and chlamydia. Medications to be sent to pharmacy. Informed pt that partner also needs to be treated and to abstain from sex for 7-10 days after treatment. Pt verbalizes and understands.

## 2018-02-25 ENCOUNTER — Other Ambulatory Visit: Payer: Self-pay | Admitting: Internal Medicine

## 2018-02-25 DIAGNOSIS — N6320 Unspecified lump in the left breast, unspecified quadrant: Secondary | ICD-10-CM

## 2018-03-01 ENCOUNTER — Ambulatory Visit (INDEPENDENT_AMBULATORY_CARE_PROVIDER_SITE_OTHER): Payer: BC Managed Care – PPO

## 2018-03-01 VITALS — BP 158/98 | HR 72

## 2018-03-01 DIAGNOSIS — Z113 Encounter for screening for infections with a predominantly sexual mode of transmission: Secondary | ICD-10-CM

## 2018-03-01 DIAGNOSIS — N898 Other specified noninflammatory disorders of vagina: Secondary | ICD-10-CM

## 2018-03-01 NOTE — Progress Notes (Signed)
SUBJECTIVE:  42 y.o. female complains of  vaginal discharge for a couple of days. Denies abnormal vaginal bleeding or significant pelvic pain or fever. No UTI symptoms. Denies history of known exposure to STD.  No LMP recorded.  OBJECTIVE:  She appears well, afebrile. Urine dipstick: not done ASSESSMENT:  Vaginal Discharge small amount  Vaginal Odor small amount    PLAN:  GC, chlamydia, trichomonas, BVAG, CVAG probe sent to lab.Patient blood pressure was high today. She reports not having high blood pressure however her dad pass away today. I have advised her to monitored her blood pressure and if she should feel dizzy or have a have headache that will not go away after taking Tylenol  She should called her pcp or to doctor. Patient voice understand. To be determined once lab results are received ROV prn if symptoms persist or worsen.

## 2018-03-04 LAB — CERVICOVAGINAL ANCILLARY ONLY
Bacterial vaginitis: NEGATIVE
Candida vaginitis: NEGATIVE
Chlamydia: NEGATIVE
Neisseria Gonorrhea: NEGATIVE
TRICH (WINDOWPATH): NEGATIVE

## 2018-04-12 ENCOUNTER — Ambulatory Visit
Admission: RE | Admit: 2018-04-12 | Discharge: 2018-04-12 | Disposition: A | Discharge status: Home / Self Care | Payer: BC Managed Care – PPO | Source: Ambulatory Visit | Attending: Internal Medicine | Admitting: Internal Medicine

## 2018-04-12 DIAGNOSIS — N6320 Unspecified lump in the left breast, unspecified quadrant: Secondary | ICD-10-CM

## 2018-05-12 ENCOUNTER — Encounter: Payer: Self-pay | Admitting: Radiology

## 2018-05-24 ENCOUNTER — Ambulatory Visit: Payer: Self-pay | Admitting: Internal Medicine

## 2018-05-24 NOTE — Telephone Encounter (Signed)
Will see at upcoming appt 

## 2018-05-24 NOTE — Telephone Encounter (Signed)
Pt called in saying she had an episode on Saturday where she was driving and got dizzy.  She felt hot and sweaty and lightheaded too.   She was fasting and wondering if her blood sugar dropped.   After she ate she was fine.  She requested to come in and see Webb Silversmith, NP even though she is better.   She did mention she still has a mild headache since it happened Saturday.  I scheduled her with Webb Silversmith, NP for 05/25/2018 at 11:30.   I instructed her to go to the ED if she had another episode like Saturday.    She verbalized understanding.  I sent these notes to [facility] at [facility].  Reason for Disposition . [1] Blood glucose < 70  mg/dL (3.9 mmol/L) or symptomatic, now improved with Care Advice AND [2] cause unknown  Answer Assessment - Initial Assessment Questions 1. DESCRIPTION: "Describe your dizziness."     I have a headache.   I was driving and felt dizzy.   I went into a gas station and was feeling hot.    I called my finance to let him know where I was.    2. LIGHTHEADED: "Do you feel lightheaded?" (e.g., somewhat faint, woozy, weak upon standing)     I'm not diabetic.     After I ate I felt better.    Never happened before.   I got dizzy and disoriented when it happened.    3. VERTIGO: "Do you feel like either you or the room is spinning or tilting?" (i.e. vertigo)     No 4. SEVERITY: "How bad is it?"  "Do you feel like you are going to faint?" "Can you stand and walk?"   - MILD - walking normally   - MODERATE - interferes with normal activities (e.g., work, school)    - SEVERE - unable to stand, requires support to walk, feels like passing out now.      I got out of the car at the gas station.   It was cool.   I took my coat off and it felt better. I was sweaty and nausea. 5. ONSET:  "When did the dizziness begin?"     Saturday evening after dark.   6. AGGRAVATING FACTORS: "Does anything make it worse?" (e.g., standing, change in head position)     No 7. HEART  RATE: "Can you tell me your heart rate?" "How many beats in 15 seconds?"  (Note: not all patients can do this)       No heart history.   8. CAUSE: "What do you think is causing the dizziness?"     I was trying intermediate fasting.   Maybe because I had not eaten.     After 1:00 PM I ate something but I can't remember what. 9. RECURRENT SYMPTOM: "Have you had dizziness before?" If so, ask: "When was the last time?" "What happened that time?"     Never 10. OTHER SYMPTOMS: "Do you have any other symptoms?" (e.g., fever, chest pain, vomiting, diarrhea, bleeding)       No 11. PREGNANCY: "Is there any chance you are pregnant?" "When was your last menstrual period?"       No  Protocols used: DIABETES - LOW BLOOD SUGAR-A-AH, DIZZINESS - [facility]

## 2018-05-25 ENCOUNTER — Ambulatory Visit: Payer: BC Managed Care – PPO | Admitting: Internal Medicine

## 2018-05-25 ENCOUNTER — Encounter: Payer: Self-pay | Admitting: Internal Medicine

## 2018-05-25 VITALS — BP 132/90 | HR 67 | Temp 98.1°F | Wt 252.0 lb

## 2018-05-25 DIAGNOSIS — R110 Nausea: Secondary | ICD-10-CM

## 2018-05-25 DIAGNOSIS — F419 Anxiety disorder, unspecified: Secondary | ICD-10-CM

## 2018-05-25 DIAGNOSIS — F32A Depression, unspecified: Secondary | ICD-10-CM

## 2018-05-25 DIAGNOSIS — R0602 Shortness of breath: Secondary | ICD-10-CM

## 2018-05-25 DIAGNOSIS — R450 Nervousness: Secondary | ICD-10-CM | POA: Diagnosis not present

## 2018-05-25 DIAGNOSIS — R42 Dizziness and giddiness: Secondary | ICD-10-CM | POA: Diagnosis not present

## 2018-05-25 DIAGNOSIS — R05 Cough: Secondary | ICD-10-CM

## 2018-05-25 DIAGNOSIS — R61 Generalized hyperhidrosis: Secondary | ICD-10-CM | POA: Diagnosis not present

## 2018-05-25 DIAGNOSIS — R053 Chronic cough: Secondary | ICD-10-CM

## 2018-05-25 DIAGNOSIS — F329 Major depressive disorder, single episode, unspecified: Secondary | ICD-10-CM

## 2018-05-25 LAB — COMPREHENSIVE METABOLIC PANEL
ALT: 13 U/L (ref 0–35)
AST: 13 U/L (ref 0–37)
Albumin: 3.7 g/dL (ref 3.5–5.2)
Alkaline Phosphatase: 79 U/L (ref 39–117)
BUN: 15 mg/dL (ref 6–23)
CO2: 26 mEq/L (ref 19–32)
Calcium: 9.2 mg/dL (ref 8.4–10.5)
Chloride: 105 mEq/L (ref 96–112)
Creatinine, Ser: 0.91 mg/dL (ref 0.40–1.20)
GFR: 82.14 mL/min (ref >60.00)
Glucose, Bld: 78 mg/dL (ref 70–99)
Potassium: 3.8 mEq/L (ref 3.5–5.1)
Sodium: 138 mEq/L (ref 135–145)
TOTAL PROTEIN: 7 g/dL (ref 6.0–8.3)
Total Bilirubin: 0.4 mg/dL (ref 0.2–1.2)

## 2018-05-25 LAB — CBC
HEMATOCRIT: 34.6 % — AB (ref 36.0–46.0)
Hemoglobin: 10.8 g/dL — ABNORMAL LOW (ref 12.0–15.0)
MCHC: 31.3 g/dL (ref 30.0–36.0)
MCV: 66.2 fl — ABNORMAL LOW (ref 78.0–100.0)
Platelets: 273 10*3/uL (ref 150.0–400.0)
RBC: 5.22 Mil/uL — ABNORMAL HIGH (ref 3.87–5.11)
RDW: 18.7 % — ABNORMAL HIGH (ref 11.5–15.5)
WBC: 10.2 10*3/uL (ref 4.0–10.5)

## 2018-05-25 LAB — HEMOGLOBIN A1C: Hgb A1c MFr Bld: 5.6 % (ref 4.6–6.5)

## 2018-05-25 LAB — TSH: TSH: 1.06 u[IU]/mL (ref 0.35–4.50)

## 2018-05-25 MED ORDER — OMEPRAZOLE 20 MG PO CPDR: 20.0000 mg | DELAYED_RELEASE_CAPSULE | Freq: Every day | ORAL | 3 refills | Status: DC

## 2018-05-25 NOTE — Progress Notes (Signed)
Subjective:    Patient ID: Gabriella Riley, female    DOB: 1975/06/22, 43 y.o.   MRN: [id number]  HPI  Pt presents to the clinic today to discuss an episode that occurred 3 days ago. She reports she was driving, and all of a sudden she felt flushed and nauseated. She reports associated lightheadedness, shaking and feeling sweaty, but denies chest pain or shortness of breath. She was able to eat something and felt better shortly after that. She reports she had this fog that lingered over her for about an hour and then resolved without intervention. She has never had an episode like this in the past. She has not had any reoccurrence of symptoms. She reports she had eaten earlier in the day about 1 pm and this was about 6 pm in the evening. She has been trying intermittent fasting. She does not sleep well, and is stress and overwhelmed at times. She reports her father died in 02-28-23, and this has been very emotional for her. She has never had issues with high or low blood pressure, high or low blood sugar. She has not taken anything OTC for her symptoms.  She also reports cough and intermittent shortness of breath. She reports this started 1 year ago. The cough is nonproductive. It seems worse at night. She reports associated runny nose, nasal congestion, but denies ear fullness or sore throat. She denies fever, chills or body aches. She reports a history of reflux, but reports this feels diffrent. She denies difficulty swallowing. She has not taken anything OTC for her symptoms.   Review of Systems      Past Medical History:  Diagnosis Date  . Arthritis     Current Outpatient Medications  Medication Sig Dispense Refill  . ferrous sulfate 325 (65 FE) MG tablet Take 1 tablet (325 mg total) by mouth daily with breakfast. (Patient not taking: Reported on 05/25/2018) 30 tablet 3  . omeprazole (PRILOSEC) 20 MG capsule Take 1 capsule (20 mg total) by mouth daily. 30 capsule 3  . Vitamin D,  Ergocalciferol, (DRISDOL) 50000 units CAPS capsule Take 1 capsule (50,000 Units total) by mouth every 7 (seven) days. (Patient not taking: Reported on 05/25/2018) 8 capsule 1   No current facility-administered medications for this visit.     Allergies  Allergen Reactions  . Latex Rash    Family History  Problem Relation Age of Onset  . Hyperlipidemia Father   . Hypertension Father   . Heart failure Father   . Arthritis Maternal Grandmother     Social History   Socioeconomic History  . Marital status: Divorced    Spouse name: Not on file  . Number of children: Not on file  . Years of education: Not on file  . Highest education level: Not on file  Occupational History  . Not on file  Social Needs  . Financial resource strain: Not on file  . Food insecurity:    Worry: Not on file    Inability: Not on file  . Transportation needs:    Medical: Not on file    Non-medical: Not on file  Tobacco Use  . Smoking status: Never Smoker  . Smokeless tobacco: Never Used  Substance and Sexual Activity  . Alcohol use: Yes    Alcohol/week: 0.0 standard drinks    Comment: social  . Drug use: No  . Sexual activity: Yes    Birth control/protection: None, Surgical    Comment: tubal ligation  Lifestyle  . Physical activity:    Days per week: Not on file    Minutes per session: Not on file  . Stress: Not on file  Relationships  . Social connections:    Talks on phone: Not on file    Gets together: Not on file    Attends religious service: Not on file    Active member of club or organization: Not on file    Attends meetings of clubs or organizations: Not on file    Relationship status: Not on file  . Intimate partner violence:    Fear of current or ex partner: Not on file    Emotionally abused: Not on file    Physically abused: Not on file    Forced sexual activity: Not on file  Other Topics Concern  . Not on file  Social History Narrative  . Not on file      Constitutional: Denies fever, malaise, fatigue, headache or abrupt weight changes.  HEENT: Denies eye pain, eye redness, ear pain, ringing in the ears, wax buildup, runny nose, nasal congestion, bloody nose, or sore throat. Respiratory: Pt reports cough and intermittent shortness of breath. Denies difficulty breathing, or sputum production.   Cardiovascular: Denies chest pain, chest tightness, palpitations or swelling in the hands or feet.  Gastrointestinal: Pt reports nausea. Denies abdominal pain, bloating, constipation, diarrhea or blood in the stool.  GU: Denies urgency, frequency, pain with urination, burning sensation, blood in urine, odor or discharge. Musculoskeletal: Denies decrease in range of motion, difficulty with gait, muscle pain or joint pain and swelling.  Skin: Pt reports sweating. Denies redness, rashes, lesions or ulcercations.  Neurological: Pt reports lightheadedness, shaking. Denies dizziness, difficulty with memory, difficulty with speech or problems with balance and coordination.  Psych: Pt reports stress and anxiety. Denies  depression, SI/HI.  No other specific complaints in a complete review of systems (except as listed in HPI above).  Objective:   Physical Exam   BP 132/90   Pulse 67   Temp 98.1 F (36.7 C) (Oral)   Wt 252 lb (114.3 kg)   LMP 04/29/2018   SpO2 98%   BMI 39.47 kg/m  Wt Readings from Last 3 Encounters:  05/25/18 252 lb (114.3 kg)  02/04/18 252 lb (114.3 kg)  07/21/17 245 lb 6.4 oz (111.3 kg)    General: Appears her stated age, obese, in NAD. HEENT:Ears: Tm's gray and intact, normal light reflex; Nose: mucosa pink and moist, septum midline; Throat/Mouth: Teeth present, mucosa pink and moist, no exudate, lesions or ulcerations noted.  Neck:  No adenopathy noted. Cardiovascular: Normal rate and rhythm. S1,S2 noted.  No murmur, rubs or gallops noted.  Pulmonary/Chest: Normal effort and positive vesicular breath sounds. No respiratory  distress. No wheezes, rales or ronchi noted.  Abdomen: Soft and nontender. Normal bowel sounds.  Neurological: Alert and oriented.  Coordination normal.  Psychiatric: Tearful when talking about the loss of her father. Behavior is normal. Judgment and thought content normal.     BMET    Component Value Date/Time   NA 138 09/28/2017 1005   K 4.2 09/28/2017 1005   CL 105 09/28/2017 1005   CO2 21 09/28/2017 1005   GLUCOSE 85 09/28/2017 1005   GLUCOSE 76 05/14/2009 0921   BUN 12 09/28/2017 1005   CREATININE 0.90 09/28/2017 1005   CALCIUM 9.3 09/28/2017 1005   GFRNONAA 80 09/28/2017 1005   GFRAA 92 09/28/2017 1005    Lipid Panel     Component  Value Date/Time   CHOL 185 09/28/2017 1005   TRIG 66 09/28/2017 1005   HDL 59 09/28/2017 1005   CHOLHDL 3.1 09/28/2017 1005   LDLCALC 113 (H) 09/28/2017 1005    CBC    Component Value Date/Time   WBC 10.8 (H) 02/04/2018 1506   RBC 5.13 (H) 02/04/2018 1506   HGB 10.7 (L) 02/04/2018 1506   HGB 10.4 (L) 09/28/2017 1005   HCT 34.0 (L) 02/04/2018 1506   HCT 34.0 09/28/2017 1005   PLT 289.0 02/04/2018 1506   PLT 294 09/28/2017 1005   MCV 66.3 Repeated and verified X2. (L) 02/04/2018 1506   MCV 68 (L) 09/28/2017 1005   MCH 20.6 (L) 09/28/2017 1005   MCHC 31.4 02/04/2018 1506   RDW 17.6 (H) 02/04/2018 1506   RDW 18.1 (H) 09/28/2017 1005    Hgb A1C No results found for: HGBA1C         Assessment & Plan:   Dizziness, Nausea, Jittery, Sweaty:  Sounds like s/s of hypoglycemia, ? Intermittent fasting is the cause Will check CBC, CMET, TSH and A1C today Advised her stay well hydrated and eat something every 3-4 hours Advised her we may never know the exact cause of this episode  Cough, SOB:  Will trial Omeprazole 20 mg daily, RX sent to pharmacy If no improvement, will try Zyrtec 10 mg daily x 14 days If persistent, consider chest xray, spirometry and Flovent  Anxiety and Depression:  Support offered today She is not  interested in seeing a therapist at this time She is not interested in medication therapy at this time  Will follow up after labs, return precautions discussed Webb Silversmith, NP

## 2018-05-25 NOTE — Patient Instructions (Signed)

## 2018-06-04 ENCOUNTER — Ambulatory Visit: Payer: BC Managed Care – PPO | Admitting: Internal Medicine

## 2018-06-04 ENCOUNTER — Other Ambulatory Visit: Payer: Self-pay

## 2018-06-04 ENCOUNTER — Encounter: Payer: Self-pay | Admitting: Internal Medicine

## 2018-06-04 ENCOUNTER — Telehealth: Payer: Self-pay

## 2018-06-04 VITALS — BP 130/84 | HR 77 | Temp 98.4°F | Wt 257.0 lb

## 2018-06-04 DIAGNOSIS — R0982 Postnasal drip: Secondary | ICD-10-CM

## 2018-06-04 DIAGNOSIS — R059 Cough, unspecified: Secondary | ICD-10-CM

## 2018-06-04 DIAGNOSIS — R05 Cough: Secondary | ICD-10-CM | POA: Diagnosis not present

## 2018-06-04 NOTE — Telephone Encounter (Signed)
Pt does not have wheezing or cough unless takes deep breath; some tightness in chest but no CP, distress in breathing, or fever and eyes are itching. Pt request late appt today and pt already has appt 06/04/18 at 4 PM. Pt has not traveled and has not to her knowledge been exposed to Covid 19 or flu. FYI to Avie Echevaria NP.

## 2018-06-04 NOTE — Patient Instructions (Signed)

## 2018-06-04 NOTE — Progress Notes (Signed)
HPI  Pt presents to the clinic today with c/o cough and wheezing. She reports this started 1-2 weeks ago. The cough is non productive. She only has wheezing when she takes a deep breath. She denies runny nose, nasal congestion, ear pain, sore throat or shortness of breath. She denies fever, chills or body aches. She has not tried anything OTC. She has not had sick contacts. She does not smoke. She does not take flu shots.  Review of Systems      Past Medical History:  Diagnosis Date  . Arthritis     Family History  Problem Relation Age of Onset  . Hyperlipidemia Father   . Hypertension Father   . Heart failure Father   . Arthritis Maternal Grandmother     Social History   Socioeconomic History  . Marital status: Divorced    Spouse name: Not on file  . Number of children: Not on file  . Years of education: Not on file  . Highest education level: Not on file  Occupational History  . Not on file  Social Needs  . Financial resource strain: Not on file  . Food insecurity:    Worry: Not on file    Inability: Not on file  . Transportation needs:    Medical: Not on file    Non-medical: Not on file  Tobacco Use  . Smoking status: Never Smoker  . Smokeless tobacco: Never Used  Substance and Sexual Activity  . Alcohol use: Yes    Alcohol/week: 0.0 standard drinks    Comment: social  . Drug use: No  . Sexual activity: Yes    Birth control/protection: None, Surgical    Comment: tubal ligation   Lifestyle  . Physical activity:    Days per week: Not on file    Minutes per session: Not on file  . Stress: Not on file  Relationships  . Social connections:    Talks on phone: Not on file    Gets together: Not on file    Attends religious service: Not on file    Active member of club or organization: Not on file    Attends meetings of clubs or organizations: Not on file    Relationship status: Not on file  . Intimate partner violence:    Fear of current or ex partner: Not  on file    Emotionally abused: Not on file    Physically abused: Not on file    Forced sexual activity: Not on file  Other Topics Concern  . Not on file  Social History Narrative  . Not on file    Allergies  Allergen Reactions  . Latex Rash     Constitutional: Denies headache, fatigue, fever or abrupt weight changes.  HEENT:  Denies eye redness, eye pain, pressure behind the eyes, facial pain, nasal congestion, ear pain, ringing in the ears, wax buildup, runny nose or sore throat. Respiratory: Positive cough and wheezing. Denies difficulty breathing or shortness of breath.  Cardiovascular: Denies chest pain, chest tightness, palpitations or swelling in the hands or feet.   No other specific complaints in a complete review of systems (except as listed in HPI above).  Objective:  BP 130/84   Pulse 77   Temp 98.4 F (36.9 C) (Oral)   Wt 257 lb (116.6 kg)   SpO2 99%   BMI 40.25 kg/m   Wt Readings from Last 3 Encounters:  05/25/18 252 lb (114.3 kg)  02/04/18 252 lb (114.3 kg)  07/21/17  245 lb 6.4 oz (111.3 kg)     General: Appears her stated age, obese, in NAD. HEENT: Head: normal shape and size;  Throat/Mouth: + PND. Teeth present, mucosa pink and moist, no exudate noted, no lesions or ulcerations noted.  Neck: No cervical lymphadenopathy.  Cardiovascular: Normal rate and rhythm. S1,S2 noted.  No murmur, rubs or gallops noted.  Pulmonary/Chest: Normal effort and positive vesicular breath sounds. No respiratory distress. No wheezes, rales or ronchi noted.       Assessment & Plan:   Cough secondary to PND:  Get some rest and drink plenty of water Start Zyrtec OTC  RTC as needed or if symptoms persist.   Webb Silversmith, NP

## 2018-06-04 NOTE — Telephone Encounter (Signed)
Will see at upcoming appt 

## 2018-08-11 ENCOUNTER — Other Ambulatory Visit: Payer: Self-pay

## 2018-08-11 ENCOUNTER — Ambulatory Visit: Payer: BC Managed Care – PPO | Admitting: Internal Medicine

## 2018-08-11 ENCOUNTER — Telehealth: Payer: Self-pay

## 2018-08-11 ENCOUNTER — Encounter: Payer: Self-pay | Admitting: Internal Medicine

## 2018-08-11 VITALS — BP 134/84 | HR 69 | Temp 98.2°F | Ht 67.0 in | Wt 260.0 lb

## 2018-08-11 DIAGNOSIS — R21 Rash and other nonspecific skin eruption: Secondary | ICD-10-CM

## 2018-08-11 DIAGNOSIS — R635 Abnormal weight gain: Secondary | ICD-10-CM

## 2018-08-11 DIAGNOSIS — J351 Hypertrophy of tonsils: Secondary | ICD-10-CM | POA: Diagnosis not present

## 2018-08-11 MED ORDER — PHENTERMINE HCL 15 MG PO CAPS: 15.0000 mg | ORAL_CAPSULE | ORAL | 0 refills | Status: DC

## 2018-08-11 NOTE — Telephone Encounter (Signed)
Copied from [place] [id number]. Topic: Appointment Scheduling - Scheduling Inquiry for Clinic >> Aug 10, 2018  4:06 PM Nils Flack wrote: Reason for CRM: pt would like to make appt to discuss weight loss pill and her tonsils.  Cb is [phone]

## 2018-08-11 NOTE — Patient Instructions (Signed)
Preventing Unhealthy Weight Gain, Adult  Staying at a healthy weight is important to your overall health. When fat builds up in your body, you may become overweight or obese. Being overweight or obese increases your risk of developing certain health problems, such as heart disease, diabetes, sleeping problems, joint problems, and some types of cancer.  Unhealthy weight gain is often the result of making unhealthy food choices or not getting enough exercise. You can make changes to your lifestyle to prevent obesity and stay as healthy as possible.  What nutrition changes can be made?    Eat only as much as your body needs. To do this:  Pay attention to signs that you are hungry or full. Stop eating as soon as you feel full.  If you feel hungry, try drinking water first before eating. Drink enough water so your urine is clear or pale yellow.  Eat smaller portions. Pay attention to portion sizes when eating out.  Look at serving sizes on food labels. Most foods contain more than one serving per container.  Eat the recommended number of calories for your gender and activity level. For most active people, a daily total of 2,000 calories is appropriate. If you are trying to lose weight or are not very active, you may need to eat fewer calories. Talk with your health care provider or a diet and nutrition specialist (dietitian) about how many calories you need each day.  Choose healthy foods, such as:  Fruits and vegetables. At each meal, try to fill at least half of your plate with fruits and vegetables.  Whole grains, such as whole-wheat bread, brown rice, and quinoa.  Lean meats, such as chicken or fish.  Other healthy proteins, such as beans, eggs, or tofu.  Healthy fats, such as nuts, seeds, fatty fish, and olive oil.  Low-fat or fat-free dairy products.  Check food labels, and avoid food and drinks that:  Are high in calories.  Have added sugar.  Are high in sodium.  Have saturated fats or trans fats.  Cook foods  in healthier ways, such as by baking, broiling, or grilling.  Make a meal plan for the week, and shop with a grocery list to help you stay on track with your purchases. Try to avoid going to the grocery store when you are hungry.  When grocery shopping, try to shop around the outside of the store first, where the fresh foods are. Doing this helps you to avoid prepackaged foods, which can be high in sugar, salt (sodium), and fat.  What lifestyle changes can be made?    Exercise for 30 or more minutes on 5 or more days each week. Exercising may include brisk walking, yard work, biking, running, swimming, and team sports like basketball and soccer. Ask your health care provider which exercises are safe for you.  Do muscle-strengthening activities, such as lifting weights or using resistance bands, on 2 or more days a week.  Do not use any products that contain nicotine or tobacco, such as cigarettes and e-cigarettes. If you need help quitting, ask your health care provider.  Limit alcohol intake to no more than 1 drink a day for nonpregnant women and 2 drinks a day for men. One drink equals 12 oz of beer, 5 oz of wine, or 1 oz of hard liquor.  Try to get 7-9 hours of sleep each night.  What other changes can be made?  Keep a food and activity journal to keep track of:    What you ate and how many calories you had. Remember to count the calories in sauces, dressings, and side dishes.  Whether you were active, and what exercises you did.  Your calorie, weight, and activity goals.  Check your weight regularly. Track any changes. If you notice you have gained weight, make changes to your diet or activity routine.  Avoid taking weight-loss medicines or supplements. Talk to your health care provider before starting any new medicine or supplement.  Talk to your health care provider before trying any new diet or exercise plan.  Why are these changes important?  Eating healthy, staying active, and having healthy habits can help  you to prevent obesity. Those changes also:  Help you manage stress and emotions.  Help you connect with friends and family.  Improve your self-esteem.  Improve your sleep.  Prevent long-term health problems.  What can happen if changes are not made?  Being obese or overweight can cause you to develop joint or bone problems, which can make it hard for you to stay active or do activities you enjoy. Being obese or overweight also puts stress on your heart and lungs and can lead to health problems like diabetes, heart disease, and some cancers.  Where to find more information  Talk with your health care provider or a dietitian about healthy eating and healthy lifestyle choices. You may also find information from:  [place] Department of Agriculture, MyPlate: www.choosemyplate.gov  American Heart Association: www.heart.org  Centers for Disease Control and Prevention: www.cdc.gov  Summary  Staying at a healthy weight is important to your overall health. It helps you to prevent certain diseases and health problems, such as heart disease, diabetes, joint problems, sleep disorders, and some types of cancer.  Being obese or overweight can cause you to develop joint or bone problems, which can make it hard for you to stay active or do activities you enjoy.  You can prevent unhealthy weight gain by eating a healthy diet, exercising regularly, not smoking, limiting alcohol, and getting enough sleep.  Talk with your health care provider or a dietitian for guidance about healthy eating and healthy lifestyle choices.  This information is not intended to replace advice given to you by your health care provider. Make sure you discuss any questions you have with your health care provider.  Document Released: 03/11/2016 Document Revised: 12/19/2016 Document Reviewed: 04/16/2016  Elsevier Interactive Patient Education  2019 [employer]

## 2018-08-11 NOTE — Telephone Encounter (Signed)
Patient scheduled in office appointment today at 2:45.  Patient said her tonsils are swollen, but not sore. No fever. Patient said it's been going on for about 2 weeks.

## 2018-08-11 NOTE — Progress Notes (Signed)
Subjective:    Patient ID: Gabriella Riley, female    DOB: 1975-06-19, 43 y.o.   MRN: [id number]  HPI  Pt presents to the clinic today with c/o swollen tonsils. She reports this started 1-2 weeks ago. She denies difficulty swallowing or sore throat. She denies runny nose, nasal congestion, ear pain or cough. She denies reflux. She denies fever, chills or body aches. She has tried Benadryl and Zyrtec with some improvement.  She also reports a rash on her face. She noticed this 1 week ago. The rash did not itch or burn. She put Hydrocortisone Cream on it with complete resolution.  She would also like to know if there is something that she could take for weight loss. She has been trying intermittent fasting, Keto diet and she has gained weight instead of losing. She has never taken medication in the past for weight loss. Her weight today is 260 with a BMI of 40.01.  Breakfast: nothing Lunch: Protein shakes Dinner: Chicken and brocolli Snacks: Nuts  Exercise: None  Review of Systems  Past Medical History:  Diagnosis Date  . Arthritis     Current Outpatient Medications  Medication Sig Dispense Refill  . omeprazole (PRILOSEC) 20 MG capsule Take 1 capsule (20 mg total) by mouth daily. 30 capsule 3   No current facility-administered medications for this visit.     Allergies  Allergen Reactions  . Latex Rash    Family History  Problem Relation Age of Onset  . Hyperlipidemia Father   . Hypertension Father   . Heart failure Father   . Arthritis Maternal Grandmother     Social History   Socioeconomic History  . Marital status: Divorced    Spouse name: Not on file  . Number of children: Not on file  . Years of education: Not on file  . Highest education level: Not on file  Occupational History  . Not on file  Social Needs  . Financial resource strain: Not on file  . Food insecurity:    Worry: Not on file    Inability: Not on file  . Transportation needs:     Medical: Not on file    Non-medical: Not on file  Tobacco Use  . Smoking status: Never Smoker  . Smokeless tobacco: Never Used  Substance and Sexual Activity  . Alcohol use: Yes    Alcohol/week: 0.0 standard drinks    Comment: social  . Drug use: No  . Sexual activity: Yes    Birth control/protection: None, Surgical    Comment: tubal ligation   Lifestyle  . Physical activity:    Days per week: Not on file    Minutes per session: Not on file  . Stress: Not on file  Relationships  . Social connections:    Talks on phone: Not on file    Gets together: Not on file    Attends religious service: Not on file    Active member of club or organization: Not on file    Attends meetings of clubs or organizations: Not on file    Relationship status: Not on file  . Intimate partner violence:    Fear of current or ex partner: Not on file    Emotionally abused: Not on file    Physically abused: Not on file    Forced sexual activity: Not on file  Other Topics Concern  . Not on file  Social History Narrative  . Not on file  Constitutional: Pt reports abnormal weight gain. Denies fever, malaise, fatigue, headache.  HEENT: Pt reports swollen tonsils. Denies eye pain, eye redness, ear pain, ringing in the ears, wax buildup, runny nose, nasal congestion, bloody nose, or sore throat. Respiratory: Denies difficulty breathing, shortness of breath, cough or sputum production.   Cardiovascular: Denies chest pain, chest tightness, palpitations or swelling in the hands or feet.  Skin: Denies redness, rashes, lesions or ulcercations.    No other specific complaints in a complete review of systems (except as listed in HPI above).     Objective:   Physical Exam    BP 134/84   Pulse 69   Temp 98.2 F (36.8 C) (Oral)   Ht 5\' 7"  (1.702 m)   Wt 260 lb (117.9 kg)   LMP  (Exact Date)   SpO2 98%   BMI 40.72 kg/m  Wt Readings from Last 3 Encounters:  08/11/18 260 lb (117.9 kg)  06/04/18  257 lb (116.6 kg)  05/25/18 252 lb (114.3 kg)    General: Appears her stated age, obese, in NAD. Skin: Warm, dry and intact. No rashesnoted. Cardiovascular: Normal rate and rhythm.  Pulmonary/Chest: Normal effort and positive vesicular breath sounds. No respiratory distress. No wheezes, rales or ronchi noted.  Neurological: Alert and oriented.    BMET    Component Value Date/Time   NA 138 05/25/2018 1204   NA 138 09/28/2017 1005   K 3.8 05/25/2018 1204   CL 105 05/25/2018 1204   CO2 26 05/25/2018 1204   GLUCOSE 78 05/25/2018 1204   BUN 15 05/25/2018 1204   BUN 12 09/28/2017 1005   CREATININE 0.91 05/25/2018 1204   CALCIUM 9.2 05/25/2018 1204   GFRNONAA 80 09/28/2017 1005   GFRAA 92 09/28/2017 1005    Lipid Panel     Component Value Date/Time   CHOL 185 09/28/2017 1005   TRIG 66 09/28/2017 1005   HDL 59 09/28/2017 1005   CHOLHDL 3.1 09/28/2017 1005   LDLCALC 113 (H) 09/28/2017 1005    CBC    Component Value Date/Time   WBC 10.2 05/25/2018 1204   RBC 5.22 (H) 05/25/2018 1204   HGB 10.8 (L) 05/25/2018 1204   HGB 10.4 (L) 09/28/2017 1005   HCT 34.6 (L) 05/25/2018 1204   HCT 34.0 09/28/2017 1005   PLT 273.0 05/25/2018 1204   PLT 294 09/28/2017 1005   MCV 66.2 (L) 05/25/2018 1204   MCV 68 (L) 09/28/2017 1005   MCH 20.6 (L) 09/28/2017 1005   MCHC 31.3 05/25/2018 1204   RDW 18.7 (H) 05/25/2018 1204   RDW 18.1 (H) 09/28/2017 1005    Hgb A1C Lab Results  Component Value Date   HGBA1C 5.6 05/25/2018          Assessment & Plan:    Abnormal Weight Gain:  Discussed the importance of high protein, low carb diet and exercise  Encouraged 150 minutes of exercise per week Will start Phentermine 15 mg PO daily- discussed risk and side effects  Swollen Tonsils:  Normal appearing today Continue Zyrtec Can take Benadryl as needed  Rash:  Resolved with Hydrocortisone OTC Monitor  RTC  In 1 month for weight check/med refill Webb Silversmith, NP

## 2018-08-25 NOTE — Telephone Encounter (Signed)
Pt had OV on 08/11/18.

## 2018-08-31 ENCOUNTER — Telehealth: Payer: Self-pay | Admitting: Internal Medicine

## 2018-08-31 ENCOUNTER — Ambulatory Visit (INDEPENDENT_AMBULATORY_CARE_PROVIDER_SITE_OTHER): Payer: BC Managed Care – PPO | Admitting: Internal Medicine

## 2018-08-31 ENCOUNTER — Encounter: Payer: Self-pay | Admitting: Internal Medicine

## 2018-08-31 ENCOUNTER — Other Ambulatory Visit: Payer: BC Managed Care – PPO

## 2018-08-31 DIAGNOSIS — R339 Retention of urine, unspecified: Secondary | ICD-10-CM | POA: Diagnosis not present

## 2018-08-31 DIAGNOSIS — R300 Dysuria: Secondary | ICD-10-CM

## 2018-08-31 DIAGNOSIS — R3915 Urgency of urination: Secondary | ICD-10-CM | POA: Diagnosis not present

## 2018-08-31 DIAGNOSIS — M545 Low back pain, unspecified: Secondary | ICD-10-CM

## 2018-08-31 LAB — POC URINALSYSI DIPSTICK (AUTOMATED)
Bilirubin, UA: NEGATIVE
Glucose, UA: NEGATIVE
Ketones, UA: NEGATIVE
Leukocytes, UA: NEGATIVE
Nitrite, UA: NEGATIVE
Protein, UA: POSITIVE — AB
Spec Grav, UA: >=1.03 — AB (ref 1.010–1.025)
Urobilinogen, UA: 0.2 E.U./dL
pH, UA: 5.5 (ref 5.0–8.0)

## 2018-08-31 MED ORDER — NAPROXEN 500 MG PO TBEC: 500.0000 mg | DELAYED_RELEASE_TABLET | Freq: Two times a day (BID) | ORAL | 0 refills | Status: DC

## 2018-08-31 MED ORDER — METHOCARBAMOL 500 MG PO TABS: 500.0000 mg | ORAL_TABLET | Freq: Every evening | ORAL | 0 refills | Status: DC | PRN

## 2018-08-31 NOTE — Telephone Encounter (Signed)
Pt is having burning and pain upon urination, void small amts, lower rt back hurts; pt will bring urine specimen to office and virtual visit scheduled today at 11:45 with R Baity NP; no covid symptom,no travel and no known exposure to covid or flu.

## 2018-08-31 NOTE — Patient Instructions (Signed)

## 2018-08-31 NOTE — Telephone Encounter (Signed)
° °  Patient has possible UTI.   Patient's Phone #  [phone]

## 2018-08-31 NOTE — Progress Notes (Signed)
Virtual Visit via Video Note  I connected with Gabriella Riley on 08/31/18 at 11:45 AM EDT by a video enabled telemedicine application and verified that I am speaking with the correct person using two identifiers.  Location: Patient: Car Provider: office   I discussed the limitations of evaluation and management by telemedicine and the availability of in person appointments. The patient expressed understanding and agreed to proceed.  History of Present Illness:    HPI  Pt presents to the clinic today with c/o right lower back pain. This started 1 week ago. She describes the pain as achy, pulling and nagging. The pain does not radiate. It seems worse with laying down. She denies numbness, tingling or weakness in her legs. She has had some abdominal cramping but reports she just started her period yesterday. She is having some urgency, urinary retention and dysuria. She denies frequency or blood in her urine. She denies vaginal discharge, odor or irritation. Her bowels are moving normally. She has taken Tylenol with some relief. She denies any injury to her back that she is aware of. She has not noticed a rash in the area. She has no history of kidney stones.   Review of Systems  Past Medical History:  Diagnosis Date  . Arthritis     Family History  Problem Relation Age of Onset  . Hyperlipidemia Father   . Hypertension Father   . Heart failure Father   . Arthritis Maternal Grandmother     Social History   Socioeconomic History  . Marital status: Divorced    Spouse name: Not on file  . Number of children: Not on file  . Years of education: Not on file  . Highest education level: Not on file  Occupational History  . Not on file  Social Needs  . Financial resource strain: Not on file  . Food insecurity:    Worry: Not on file    Inability: Not on file  . Transportation needs:    Medical: Not on file    Non-medical: Not on file  Tobacco Use  . Smoking status:  Never Smoker  . Smokeless tobacco: Never Used  Substance and Sexual Activity  . Alcohol use: Yes    Alcohol/week: 0.0 standard drinks    Comment: social  . Drug use: No  . Sexual activity: Yes    Birth control/protection: None, Surgical    Comment: tubal ligation   Lifestyle  . Physical activity:    Days per week: Not on file    Minutes per session: Not on file  . Stress: Not on file  Relationships  . Social connections:    Talks on phone: Not on file    Gets together: Not on file    Attends religious service: Not on file    Active member of club or organization: Not on file    Attends meetings of clubs or organizations: Not on file    Relationship status: Not on file  . Intimate partner violence:    Fear of current or ex partner: Not on file    Emotionally abused: Not on file    Physically abused: Not on file    Forced sexual activity: Not on file  Other Topics Concern  . Not on file  Social History Narrative  . Not on file    Allergies  Allergen Reactions  . Latex Rash     Constitutional: Denies fever, malaise, fatigue, headache or abrupt weight changes.   MSK: Pt  reports right lower back pain. She denies decrease in ROM, joint pain, swelling or difficulty with gait. GU: Pt reports urgency, urinary retention and pain with urination. Denies frequency, burning sensation, blood in urine, odor or discharge. Skin: Denies redness, rashes, lesions or ulcercations.   No other specific complaints in a complete review of systems (except as listed in HPI above).    Objective:   Physical Exam   Wt Readings from Last 3 Encounters:  08/11/18 260 lb (117.9 kg)  06/04/18 257 lb (116.6 kg)  05/25/18 252 lb (114.3 kg)    General: Appears her stated age, obese, in NAD. Pulmonary/Chest: Normal effort. No respiratory distress.  Abdomen: Soft. No distention or masses noted.   MSK: Normal flexion, extension and rotation of the spine. Gait not visualized.        Assessment &  Plan:   Urgency, Urinary Retention, Dysuria, Acute Right Sided Low Back Pain:  Urinalysis: 3+ blood, on menses Will send urine culture Drink plenty of fluids Concerned that this is more muscular Will give RX for Naproxen 500 mg BID x 7 days RX for Methocarbamol 500 mg QHS- sedation caution given  Will follow up after urine culture is back, RTC as needed or if symptoms persist. Webb Silversmith, NP   Follow Up Instructions:    I discussed the assessment and treatment plan with the patient. The patient was provided an opportunity to ask questions and all were answered. The patient agreed with the plan and demonstrated an understanding of the instructions.   The patient was advised to call back or seek an in-person evaluation if the symptoms worsen or if the condition fails to improve as anticipated.

## 2018-08-31 NOTE — Telephone Encounter (Signed)
Noted.  Will see during appointment time

## 2018-09-02 LAB — URINE CULTURE
MICRO NUMBER:: 551343
SPECIMEN QUALITY:: ADEQUATE

## 2018-09-03 ENCOUNTER — Other Ambulatory Visit: Payer: Self-pay | Admitting: Internal Medicine

## 2018-09-03 MED ORDER — NITROFURANTOIN MONOHYD MACRO 100 MG PO CAPS: 100.0000 mg | ORAL_CAPSULE | Freq: Two times a day (BID) | ORAL | 0 refills | Status: DC

## 2018-09-08 MED ORDER — FLUCONAZOLE 150 MG PO TABS: 150.0000 mg | ORAL_TABLET | Freq: Every day | ORAL | 0 refills | Status: DC

## 2018-09-08 NOTE — Addendum Note (Signed)
Addended by: Jearld Fenton on: 09/08/2018 01:56 PM   Modules accepted: Orders

## 2018-09-14 ENCOUNTER — Ambulatory Visit: Payer: BC Managed Care – PPO | Admitting: Internal Medicine

## 2018-09-14 ENCOUNTER — Other Ambulatory Visit: Payer: Self-pay

## 2018-09-14 ENCOUNTER — Encounter: Payer: Self-pay | Admitting: Internal Medicine

## 2018-09-14 VITALS — BP 130/86 | HR 80 | Temp 98.0°F | Wt 256.0 lb

## 2018-09-14 DIAGNOSIS — R635 Abnormal weight gain: Secondary | ICD-10-CM | POA: Diagnosis not present

## 2018-09-14 MED ORDER — PHENTERMINE HCL 37.5 MG PO CAPS: 37.5000 mg | ORAL_CAPSULE | ORAL | 0 refills | Status: DC

## 2018-09-14 NOTE — Progress Notes (Signed)
Subjective:    Patient ID: Gabriella Riley, female    DOB: 1975/08/07, 43 y.o.   MRN: [id number]  HPI  Patient presents today for follow up on weight loss.  She was started on Phentermine at her last visit. Her starting weight was 260 lbs with a BMI of 40.72. Her weight today is 256 lbs with a BMI of 40.1. She tries to consume a low fat, low carb diet.  She is trying to routinely exercise, walking in the neighborhood 2 miles, 2-3 days per week.  Review of Systems  Past Medical History:  Diagnosis Date  . Arthritis     Current Outpatient Medications  Medication Sig Dispense Refill  . fluconazole (DIFLUCAN) 150 MG tablet Take 1 tablet (150 mg total) by mouth daily. May repeat in 3 days if needed 2 tablet 0  . methocarbamol (ROBAXIN) 500 MG tablet Take 1 tablet (500 mg total) by mouth at bedtime as needed for muscle spasms. 10 tablet 0  . naproxen (EC NAPROSYN) 500 MG EC tablet Take 1 tablet (500 mg total) by mouth 2 (two) times daily with a meal. 14 tablet 0  . nitrofurantoin, macrocrystal-monohydrate, (MACROBID) 100 MG capsule Take 1 capsule (100 mg total) by mouth 2 (two) times daily. 10 capsule 0  . omeprazole (PRILOSEC) 20 MG capsule Take 1 capsule (20 mg total) by mouth daily. 30 capsule 3  . phentermine 15 MG capsule Take 1 capsule (15 mg total) by mouth every morning. 30 capsule 0   No current facility-administered medications for this visit.     Allergies  Allergen Reactions  . Latex Rash    Family History  Problem Relation Age of Onset  . Hyperlipidemia Father   . Hypertension Father   . Heart failure Father   . Arthritis Maternal Grandmother     Social History   Socioeconomic History  . Marital status: Divorced    Spouse name: Not on file  . Number of children: Not on file  . Years of education: Not on file  . Highest education level: Not on file  Occupational History  . Not on file  Social Needs  . Financial resource strain: Not on file  .  Food insecurity    Worry: Not on file    Inability: Not on file  . Transportation needs    Medical: Not on file    Non-medical: Not on file  Tobacco Use  . Smoking status: Never Smoker  . Smokeless tobacco: Never Used  Substance and Sexual Activity  . Alcohol use: Yes    Alcohol/week: 0.0 standard drinks    Comment: social  . Drug use: No  . Sexual activity: Yes    Birth control/protection: None, Surgical    Comment: tubal ligation   Lifestyle  . Physical activity    Days per week: Not on file    Minutes per session: Not on file  . Stress: Not on file  Relationships  . Social Herbalist on phone: Not on file    Gets together: Not on file    Attends religious service: Not on file    Active member of club or organization: Not on file    Attends meetings of clubs or organizations: Not on file    Relationship status: Not on file  . Intimate partner violence    Fear of current or ex partner: Not on file    Emotionally abused: Not on file    Physically abused:  Not on file    Forced sexual activity: Not on file  Other Topics Concern  . Not on file  Social History Narrative  . Not on file     Constitutional: Denies fever, malaise, fatigue, headache or abrupt weight changes.  Respiratory: Denies difficulty breathing, shortness of breath, cough or sputum production.   Cardiovascular: Denies chest pain, chest tightness, palpitations or swelling in the hands or feet.     Objective:   Physical Exam  BP 130/86   Pulse 80   Temp 98 F (36.7 C) (Oral)   Wt 256 lb (116.1 kg)   LMP 08/30/2018   SpO2 98%   BMI 40.10 kg/m   LMP 08/30/2018  Wt Readings from Last 3 Encounters:  08/11/18 260 lb (117.9 kg)  06/04/18 257 lb (116.6 kg)  05/25/18 252 lb (114.3 kg)    General: Appears her stated age, obese in NAD. Cardiovascular: Normal rate and rhythm. S1,S2 noted.  No murmur, rubs or gallops noted.  Pulmonary/Chest: Normal effort and positive vesicular breath  sounds. No respiratory distress. No wheezes, rales or ronchi noted.   BMET    Component Value Date/Time   NA 138 05/25/2018 1204   NA 138 09/28/2017 1005   K 3.8 05/25/2018 1204   CL 105 05/25/2018 1204   CO2 26 05/25/2018 1204   GLUCOSE 78 05/25/2018 1204   BUN 15 05/25/2018 1204   BUN 12 09/28/2017 1005   CREATININE 0.91 05/25/2018 1204   CALCIUM 9.2 05/25/2018 1204   GFRNONAA 80 09/28/2017 1005   GFRAA 92 09/28/2017 1005    Lipid Panel     Component Value Date/Time   CHOL 185 09/28/2017 1005   TRIG 66 09/28/2017 1005   HDL 59 09/28/2017 1005   CHOLHDL 3.1 09/28/2017 1005   LDLCALC 113 (H) 09/28/2017 1005    CBC    Component Value Date/Time   WBC 10.2 05/25/2018 1204   RBC 5.22 (H) 05/25/2018 1204   HGB 10.8 (L) 05/25/2018 1204   HGB 10.4 (L) 09/28/2017 1005   HCT 34.6 (L) 05/25/2018 1204   HCT 34.0 09/28/2017 1005   PLT 273.0 05/25/2018 1204   PLT 294 09/28/2017 1005   MCV 66.2 (L) 05/25/2018 1204   MCV 68 (L) 09/28/2017 1005   MCH 20.6 (L) 09/28/2017 1005   MCHC 31.3 05/25/2018 1204   RDW 18.7 (H) 05/25/2018 1204   RDW 18.1 (H) 09/28/2017 1005    Hgb A1C Lab Results  Component Value Date   HGBA1C 5.6 05/25/2018           Assessment & Plan:   Abnormal Weight Gain  Will increase Phentermine to 37.5 mg daily and recheck weight in office in 1 month. Encouraged healthy diet, avoiding carbs and engaging in regular exercise.  Return precautions discussed.  Webb Silversmith, NP

## 2018-09-14 NOTE — Patient Instructions (Signed)

## 2018-10-08 ENCOUNTER — Ambulatory Visit: Payer: Self-pay

## 2018-10-08 NOTE — Telephone Encounter (Signed)
Pt call transferred to triage from office,Gabriella Riley. Pt has C/O ear pain.  She states that she has this often. It is her left ear.  She states she has a swollen gland on the left side of her neck and her left tonsil feels swollen too.  She state that she has an occasional cough. She has no fever. Per protocol Pt will go to urgent care for evaluation and treatment. Per Shirlean Mylar there are no appointment available today at the office. Per request of patient call was placed to office.No message was left because of Gabriella Riley"s advice. Pt states that she will go to [facility] for evaluation and treatment.   Reason for Disposition . Earache  (Exceptions: brief ear pain of < 60 minutes duration, earache occurring during air travel  Answer Assessment - Initial Assessment Questions 1. LOCATION: "Which ear is involved?"     Left ear 2. ONSET: "When did the ear start hurting"      wednesday 3. SEVERITY: "How bad is the pain?"  (Scale 1-10; mild, moderate or severe)   - MILD (1-3): doesn't interfere with normal activities    - MODERATE (4-7): interferes with normal activities or awakens from sleep    - SEVERE (8-10): excruciating pain, unable to do any normal activities      7-8 4. URI SYMPTOMS: " Do you have a runny nose or cough?"     Cough swollen toncil 5. FEVER: "Do you have a fever?" If so, ask: "What is your temperature, how was it measured, and when did it start?"     no 6. CAUSE: "Have you been swimming recently?", "How often do you use Q-TIPS?", "Have you had any recent air travel or scuba diving?"     no 7. OTHER SYMPTOMS: "Do you have any other symptoms?" (e.g., headache, stiff neck, dizziness, vomiting, runny nose, decreased hearing)     Headache at night, mild decreased hearing 8. PREGNANCY: "Is there any chance you are pregnant?" "When was your last menstrual period?"    No  Protocols used: EARACHE-A-AH

## 2018-10-08 NOTE — Telephone Encounter (Signed)
noted 

## 2018-10-11 ENCOUNTER — Telehealth: Payer: Self-pay | Admitting: Internal Medicine

## 2018-10-11 NOTE — Telephone Encounter (Signed)
Patient was seen on [facility] on Friday. She try calling them to request Rx for Diflucan but no response. She was Dx with ear infection and was given amoxicillin. Patient asked if Rollene Fare can prescribed Rx for Diflucan due getting yeast infection and taking amoxicillin. Please advise.

## 2018-10-11 NOTE — Telephone Encounter (Signed)
Set up [email or web address] to discuss

## 2018-10-13 NOTE — Telephone Encounter (Signed)
Left detailed msg on VM per HIPAA  

## 2018-10-15 ENCOUNTER — Encounter: Payer: Self-pay | Admitting: Family Medicine

## 2018-10-15 ENCOUNTER — Ambulatory Visit: Payer: BC Managed Care – PPO | Admitting: Family Medicine

## 2018-10-15 ENCOUNTER — Other Ambulatory Visit: Payer: Self-pay

## 2018-10-15 VITALS — BP 138/84 | HR 105 | Temp 97.7°F | Ht 67.0 in | Wt 251.2 lb

## 2018-10-15 DIAGNOSIS — H6982 Other specified disorders of Eustachian tube, left ear: Secondary | ICD-10-CM

## 2018-10-15 MED ORDER — FLUTICASONE PROPIONATE 50 MCG/ACT NA SUSP: 2.0000 | Freq: Every day | NASAL | 1 refills | Status: DC

## 2018-10-15 NOTE — Progress Notes (Signed)
This visit was conducted in person.  BP 138/84   Pulse (!) 105   Temp 97.7 F (36.5 C)   Ht 5\' 7"  (1.702 m)   Wt 251 lb 4 oz (114 kg)   SpO2 99%   BMI 39.35 kg/m    CC: f/u ear infection Subjective:    Patient ID: Gabriella Riley, female    DOB: [birth year], 43 y.o.   MRN: [id number]  HPI: Gabriella Riley is a 43 y.o. female presenting on 10/15/2018 for Ear Problem (follow up on ear infection. Was seen at [facility] on 10/09/2018. On Amoxil)   Recent [facility] eval last Friday ([facility]) for L ear infection treated with augmentin course taking over last 7 days. Ear pain improving but still feels achey. Initial L muffled hearing is improving. Tylenol hasn't helped.   Yesterday note some unsteadiness, dysequilibrium that made her spend a good portion of the day in bed. Mild lightheadedness today.  Today noticing some R shoulder blade pain with deep breathing.   No fever/chills, nasal congestion, ST, HA.  No recent swimming, tinnitus, drainage from ears.  Intermittent allergies managed with PRN zyrtec.      Relevant past medical, surgical, family and social history reviewed and updated as indicated. Interim medical history since our last visit reviewed. Allergies and medications reviewed and updated. Outpatient Medications Prior to Visit  Medication Sig Dispense Refill  . amoxicillin-clavulanate (AUGMENTIN) 875-125 MG tablet Take by mouth.    . clindamycin (CLEOCIN T) 1 % external solution APPLY TO CHIN 1 2X DAILY    . methocarbamol (ROBAXIN) 500 MG tablet Take 1 tablet (500 mg total) by mouth at bedtime as needed for muscle spasms. 10 tablet 0  . omeprazole (PRILOSEC) 20 MG capsule Take 1 capsule (20 mg total) by mouth daily. 30 capsule 3  . naproxen (EC NAPROSYN) 500 MG EC tablet Take 1 tablet (500 mg total) by mouth 2 (two) times daily with a meal. 14 tablet 0  . phentermine 37.5 MG capsule Take 1 capsule (37.5 mg total) by mouth every morning. (Patient  not taking: Reported on 10/15/2018) 30 capsule 0   No facility-administered medications prior to visit.      Per HPI unless specifically indicated in ROS section below Review of Systems Objective:    BP 138/84   Pulse (!) 105   Temp 97.7 F (36.5 C)   Ht 5\' 7"  (1.702 m)   Wt 251 lb 4 oz (114 kg)   SpO2 99%   BMI 39.35 kg/m   Wt Readings from Last 3 Encounters:  10/15/18 251 lb 4 oz (114 kg)  09/14/18 256 lb (116.1 kg)  08/11/18 260 lb (117.9 kg)    Physical Exam Vitals signs and nursing note reviewed.  Constitutional:      General: She is not in acute distress.    Appearance: Normal appearance. She is well-developed. She is not ill-appearing.  HENT:     Head: Normocephalic and atraumatic.     Right Ear: Hearing, tympanic membrane, ear canal and external ear normal.     Left Ear: Hearing, tympanic membrane, ear canal and external ear normal.     Ears:     Comments: TMs pearly grey with good light reflex bilaterally    Nose: Mucosal edema and congestion present. No rhinorrhea.     Right Sinus: Maxillary sinus tenderness (mild) present. No frontal sinus tenderness.     Left Sinus: No maxillary sinus tenderness or frontal sinus  tenderness.     Mouth/Throat:     Mouth: Mucous membranes are moist.     Pharynx: Uvula midline. No oropharyngeal exudate or posterior oropharyngeal erythema.     Tonsils: No tonsillar abscesses.  Eyes:     General: No scleral icterus.    Extraocular Movements: Extraocular movements intact.     Conjunctiva/sclera: Conjunctivae normal.     Pupils: Pupils are equal, round, and reactive to light.  Neck:     Musculoskeletal: Normal range of motion and neck supple.  Cardiovascular:     Rate and Rhythm: Normal rate and regular rhythm.     Pulses: Normal pulses.     Heart sounds: Normal heart sounds. No murmur.  Pulmonary:     Effort: Pulmonary effort is normal. No respiratory distress.     Breath sounds: Normal breath sounds. No wheezing, rhonchi or  rales.  Lymphadenopathy:     Cervical: No cervical adenopathy.  Skin:    General: Skin is warm and dry.     Findings: No rash.  Neurological:     Mental Status: She is alert.       Assessment & Plan:   Problem List Items Addressed This Visit    ETD (Eustachian tube dysfunction), left - Primary    Anticipate residual sinus inflammation/ETD after treated AOM - advised finish antibiotic course. Will add flonase and OTC NSAID. Update if not improving with treatment. Pt agrees with plan.           Meds ordered this encounter  Medications  . fluticasone (FLONASE) 50 MCG/ACT nasal spray    Sig: Place 2 sprays into both nostrils daily.    Dispense:  16 g    Refill:  1   No orders of the defined types were placed in this encounter.   Follow up plan: Return if symptoms worsen or fail to improve.  Ria Bush, MD

## 2018-10-15 NOTE — Patient Instructions (Signed)
Ears are looking ok today. I think you may have residual inflammation causing persistent symptoms. Treat with nasal steroid flonase sent to pharmacy and ibuprofen 400-600mg  with meals for inflammation.

## 2018-10-15 NOTE — Assessment & Plan Note (Signed)
Anticipate residual sinus inflammation/ETD after treated AOM - advised finish antibiotic course. Will add flonase and OTC NSAID. Update if not improving with treatment. Pt agrees with plan.

## 2018-11-01 ENCOUNTER — Other Ambulatory Visit: Payer: Self-pay

## 2018-11-01 ENCOUNTER — Ambulatory Visit (INDEPENDENT_AMBULATORY_CARE_PROVIDER_SITE_OTHER): Payer: BC Managed Care – PPO | Admitting: *Deleted

## 2018-11-01 VITALS — BP 145/90 | HR 70

## 2018-11-01 DIAGNOSIS — N898 Other specified noninflammatory disorders of vagina: Secondary | ICD-10-CM | POA: Diagnosis not present

## 2018-11-01 DIAGNOSIS — B9689 Other specified bacterial agents as the cause of diseases classified elsewhere: Secondary | ICD-10-CM | POA: Diagnosis not present

## 2018-11-01 DIAGNOSIS — Z113 Encounter for screening for infections with a predominantly sexual mode of transmission: Secondary | ICD-10-CM

## 2018-11-01 DIAGNOSIS — N760 Acute vaginitis: Secondary | ICD-10-CM

## 2018-11-01 NOTE — Progress Notes (Signed)
SUBJECTIVE:  43 y.o. female complains of vaginal itching and irritation for 2 day(s). Denies abnormal vaginal bleeding or significant pelvic pain or fever. No UTI symptoms. Denies history of known exposure to STD.  No LMP recorded.  OBJECTIVE:  She appears well, afebrile. Urine dipstick: not done.  ASSESSMENT:  Vaginal Irritation    PLAN:  GC, chlamydia, trichomonas, BVAG, CVAG probe sent to lab. Treatment: To be determined once lab results are received ROV prn if symptoms persist or worsen.

## 2018-11-01 NOTE — Progress Notes (Signed)
Patient seen and assessed by nursing staff.  Agree with documentation and plan.  

## 2018-11-03 ENCOUNTER — Other Ambulatory Visit: Payer: Self-pay

## 2018-11-03 DIAGNOSIS — Z20822 Contact with and (suspected) exposure to covid-19: Secondary | ICD-10-CM

## 2018-11-04 ENCOUNTER — Other Ambulatory Visit: Payer: Self-pay | Admitting: Family Medicine

## 2018-11-04 LAB — CERVICOVAGINAL ANCILLARY ONLY
Bacterial vaginitis: POSITIVE — AB
Candida vaginitis: NEGATIVE
Chlamydia: NEGATIVE
Neisseria Gonorrhea: NEGATIVE
Trichomonas: NEGATIVE

## 2018-11-04 LAB — NOVEL CORONAVIRUS, NAA: SARS-CoV-2, NAA: NOT DETECTED

## 2018-11-04 MED ORDER — METRONIDAZOLE 500 MG PO TABS: 500.0000 mg | ORAL_TABLET | Freq: Two times a day (BID) | ORAL | 0 refills | Status: AC

## 2018-11-11 ENCOUNTER — Other Ambulatory Visit: Payer: Self-pay | Admitting: Family Medicine

## 2018-11-12 NOTE — Telephone Encounter (Signed)
Spoke with patient about Flonase refill request. Patient states she did not request it. She tried to use it but did not like "putting it up her nose like that." Refill request declined.

## 2018-11-17 ENCOUNTER — Ambulatory Visit: Payer: BC Managed Care – PPO | Admitting: Internal Medicine

## 2018-11-17 ENCOUNTER — Encounter: Payer: Self-pay | Admitting: Internal Medicine

## 2018-11-17 ENCOUNTER — Other Ambulatory Visit: Payer: Self-pay

## 2018-11-17 VITALS — BP 138/86 | HR 81 | Temp 98.3°F | Wt 258.0 lb

## 2018-11-17 DIAGNOSIS — M10072 Idiopathic gout, left ankle and foot: Secondary | ICD-10-CM | POA: Diagnosis not present

## 2018-11-17 MED ORDER — PREDNISONE 10 MG PO TABS: ORAL_TABLET | ORAL | 0 refills | Status: DC

## 2018-11-17 NOTE — Patient Instructions (Signed)
Gout  Gout is painful swelling of your joints. Gout is a type of arthritis. It is caused by having too much uric acid in your body. Uric acid is a chemical that is made when your body breaks down substances called purines. If your body has too much uric acid, sharp crystals can form and build up in your joints. This causes pain and swelling. Gout attacks can happen quickly and be very painful (acute gout). Over time, the attacks can affect more joints and happen more often (chronic gout). What are the causes?  Too much uric acid in your blood. This can happen because: ? Your kidneys do not remove enough uric acid from your blood. ? Your body makes too much uric acid. ? You eat too many foods that are high in purines. These foods include organ meats, some seafood, and beer.  Trauma or stress. What increases the risk?  Having a family history of gout.  Being female and middle-aged.  Being female and having gone through menopause.  Being very overweight (obese).  Drinking alcohol, especially beer.  Not having enough water in the body (being dehydrated).  Losing weight too quickly.  Having an organ transplant.  Having lead poisoning.  Taking certain medicines.  Having kidney disease.  Having a skin condition called psoriasis. What are the signs or symptoms? An attack of acute gout usually happens in just one joint. The most common place is the big toe. Attacks often start at night. Other joints that may be affected include joints of the feet, ankle, knee, fingers, wrist, or elbow. Symptoms of an attack may include:  Very bad pain.  Warmth.  Swelling.  Stiffness.  Shiny, red, or purple skin.  Tenderness. The affected joint may be very painful to touch.  Chills and fever. Chronic gout may cause symptoms more often. More joints may be involved. You may also have white or yellow lumps (tophi) on your hands or feet or in other areas near your joints. How is this  treated?  Treatment for this condition has two phases: treating an acute attack and preventing future attacks.  Acute gout treatment may include: ? NSAIDs. ? Steroids. These are taken by mouth or injected into a joint. ? Colchicine. This medicine relieves pain and swelling. It can be given by mouth or through an IV tube.  Preventive treatment may include: ? Taking small doses of NSAIDs or colchicine daily. ? Using a medicine that reduces uric acid levels in your blood. ? Making changes to your diet. You may need to see a food expert (dietitian) about what to eat and drink to prevent gout. Follow these instructions at home: During a gout attack   If told, put ice on the painful area: ? Put ice in a plastic bag. ? Place a towel between your skin and the bag. ? Leave the ice on for 20 minutes, 2-3 times a day.  Raise (elevate) the painful joint above the level of your heart as often as you can.  Rest the joint as much as possible. If the joint is in your leg, you may be given crutches.  Follow instructions from your doctor about what you cannot eat or drink. Avoiding future gout attacks  Eat a low-purine diet. Avoid foods and drinks such as: ? Liver. ? Kidney. ? Anchovies. ? Asparagus. ? Herring. ? Mushrooms. ? Mussels. ? Beer.  Stay at a healthy weight. If you want to lose weight, talk with your doctor. Do not lose weight   too fast.  Start or continue an exercise plan as told by your doctor. Eating and drinking  Drink enough fluids to keep your pee (urine) pale yellow.  If you drink alcohol: ? Limit how much you use to:  0-1 drink a day for women.  0-2 drinks a day for men. ? Be aware of how much alcohol is in your drink. In the [place] one drink equals one 12 oz bottle of beer (355 mL), one 5 oz glass of wine (148 mL), or one 1 oz glass of hard liquor (44 mL). General instructions  Take over-the-counter and prescription medicines only as told by your doctor.  Do  not drive or use heavy machinery while taking prescription pain medicine.  Return to your normal activities as told by your doctor. Ask your doctor what activities are safe for you.  Keep all follow-up visits as told by your doctor. This is important. Contact a doctor if:  You have another gout attack.  You still have symptoms of a gout attack after 10 days of treatment.  You have problems (side effects) because of your medicines.  You have chills or a fever.  You have burning pain when you pee (urinate).  You have pain in your lower back or belly. Get help right away if:  You have very bad pain.  Your pain cannot be controlled.  You cannot pee. Summary  Gout is painful swelling of the joints.  The most common site of pain is the big toe, but it can affect other joints.  Medicines and avoiding some foods can help to prevent and treat gout attacks. This information is not intended to replace advice given to you by your health care provider. Make sure you discuss any questions you have with your health care provider. Document Released: 12/18/2007 Document Revised: 09/30/2017 Document Reviewed: 09/30/2017 Elsevier Patient Education  2020 [employer]  

## 2018-11-17 NOTE — Progress Notes (Signed)
Subjective:    Patient ID: Gabriella Riley, female    DOB: 1975/11/20, 43 y.o.   MRN: [id number]  HPI  Pt presents to the clinic today with c/o left foot swelling. This started this morning. She reports the area is red and tender to touch. She has not noticed any warmth. She denies any injury to the area. She has tried Tylenol without any relief.  Review of Systems      Past Medical History:  Diagnosis Date  . Arthritis     Current Outpatient Medications  Medication Sig Dispense Refill  . clindamycin (CLEOCIN T) 1 % external solution APPLY TO CHIN 1 2X DAILY    . fluticasone (FLONASE) 50 MCG/ACT nasal spray Place 2 sprays into both nostrils daily. 16 g 1  . methocarbamol (ROBAXIN) 500 MG tablet Take 1 tablet (500 mg total) by mouth at bedtime as needed for muscle spasms. 10 tablet 0  . omeprazole (PRILOSEC) 20 MG capsule Take 1 capsule (20 mg total) by mouth daily. 30 capsule 3  . phentermine 37.5 MG capsule Take 1 capsule (37.5 mg total) by mouth every morning. (Patient not taking: Reported on 10/15/2018) 30 capsule 0   No current facility-administered medications for this visit.     Allergies  Allergen Reactions  . Latex Rash    Family History  Problem Relation Age of Onset  . Hyperlipidemia Father   . Hypertension Father   . Heart failure Father   . Arthritis Maternal Grandmother     Social History   Socioeconomic History  . Marital status: Divorced    Spouse name: Not on file  . Number of children: Not on file  . Years of education: Not on file  . Highest education level: Not on file  Occupational History  . Not on file  Social Needs  . Financial resource strain: Not on file  . Food insecurity    Worry: Not on file    Inability: Not on file  . Transportation needs    Medical: Not on file    Non-medical: Not on file  Tobacco Use  . Smoking status: Never Smoker  . Smokeless tobacco: Never Used  Substance and Sexual Activity  . Alcohol use:  Yes    Alcohol/week: 0.0 standard drinks    Comment: social  . Drug use: No  . Sexual activity: Yes    Birth control/protection: None, Surgical    Comment: tubal ligation   Lifestyle  . Physical activity    Days per week: Not on file    Minutes per session: Not on file  . Stress: Not on file  Relationships  . Social Herbalist on phone: Not on file    Gets together: Not on file    Attends religious service: Not on file    Active member of club or organization: Not on file    Attends meetings of clubs or organizations: Not on file    Relationship status: Not on file  . Intimate partner violence    Fear of current or ex partner: Not on file    Emotionally abused: Not on file    Physically abused: Not on file    Forced sexual activity: Not on file  Other Topics Concern  . Not on file  Social History Narrative  . Not on file     Constitutional: Denies fever, malaise, fatigue, headache or abrupt weight changes.  Respiratory: Denies difficulty breathing, shortness of breath, cough or  sputum production.   Cardiovascular: Denies chest pain, chest tightness, palpitations or swelling in the hands or feet.  Musculoskeletal: Pt reports left foot pain and swelling. Denies decrease in range of motion, difficulty with gait, muscle pain.  Skin: Pt reports redness of left foot. Denies rashes, lesions or ulcercations.    No other specific complaints in a complete review of systems (except as listed in HPI above).  Objective:   Physical Exam  BP 138/86   Pulse 81   Temp 98.3 F (36.8 C) (Temporal)   Wt 258 lb (117 kg)   LMP 11/16/2018   SpO2 98%   BMI 40.41 kg/m  Wt Readings from Last 3 Encounters:  11/17/18 258 lb (117 kg)  10/15/18 251 lb 4 oz (114 kg)  09/14/18 256 lb (116.1 kg)    General: Appears her stated age, obese, in NAD. Skin: Redness and warmth noted of the left MTP. Cardiovascular: Normal rate and rhythm. Pedal pulses 2+ bilaterally. Pulmonary/Chest:  Normal effort and positive vesicular breath sounds. No respiratory distress. No wheezes, rales or ronchi noted.  Musculoskeletal: Normal flexion, extension and rotation of the left ankle. Swelling over the left MTP. Tenderness noted with palpation. Limps with normal gait. Neurological: Alert and oriented.    BMET    Component Value Date/Time   NA 138 05/25/2018 1204   NA 138 09/28/2017 1005   K 3.8 05/25/2018 1204   CL 105 05/25/2018 1204   CO2 26 05/25/2018 1204   GLUCOSE 78 05/25/2018 1204   BUN 15 05/25/2018 1204   BUN 12 09/28/2017 1005   CREATININE 0.91 05/25/2018 1204   CALCIUM 9.2 05/25/2018 1204   GFRNONAA 80 09/28/2017 1005   GFRAA 92 09/28/2017 1005    Lipid Panel     Component Value Date/Time   CHOL 185 09/28/2017 1005   TRIG 66 09/28/2017 1005   HDL 59 09/28/2017 1005   CHOLHDL 3.1 09/28/2017 1005   LDLCALC 113 (H) 09/28/2017 1005    CBC    Component Value Date/Time   WBC 10.2 05/25/2018 1204   RBC 5.22 (H) 05/25/2018 1204   HGB 10.8 (L) 05/25/2018 1204   HGB 10.4 (L) 09/28/2017 1005   HCT 34.6 (L) 05/25/2018 1204   HCT 34.0 09/28/2017 1005   PLT 273.0 05/25/2018 1204   PLT 294 09/28/2017 1005   MCV 66.2 (L) 05/25/2018 1204   MCV 68 (L) 09/28/2017 1005   MCH 20.6 (L) 09/28/2017 1005   MCHC 31.3 05/25/2018 1204   RDW 18.7 (H) 05/25/2018 1204   RDW 18.1 (H) 09/28/2017 1005    Hgb A1C Lab Results  Component Value Date   HGBA1C 5.6 05/25/2018            Assessment & Plan:   Gout Left MTP:  Encouraged elevation, work note provided RX for Pred Taper x 6 days Make lab only appt for uric acid when the pain and swelling has resolved Discussed low purine diet  Return precautions discussed Webb Silversmith, NP

## 2018-11-23 ENCOUNTER — Other Ambulatory Visit: Payer: Self-pay

## 2018-11-23 ENCOUNTER — Ambulatory Visit (INDEPENDENT_AMBULATORY_CARE_PROVIDER_SITE_OTHER): Payer: BC Managed Care – PPO | Admitting: Internal Medicine

## 2018-11-23 ENCOUNTER — Encounter: Payer: Self-pay | Admitting: Internal Medicine

## 2018-11-23 DIAGNOSIS — M10072 Idiopathic gout, left ankle and foot: Secondary | ICD-10-CM

## 2018-11-23 LAB — URIC ACID: Uric Acid, Serum: 6.2 mg/dL (ref 2.4–7.0)

## 2018-11-23 NOTE — Patient Instructions (Signed)
Sciatica Rehab Ask your health care provider which exercises are safe for you. Do exercises exactly as told by your health care provider and adjust them as directed. It is normal to feel mild stretching, pulling, tightness, or discomfort as you do these exercises. Stop right away if you feel sudden pain or your pain gets worse. Do not begin these exercises until told by your health care provider. Stretching and range-of-motion exercises These exercises warm up your muscles and joints and improve the movement and flexibility of your hips and back. These exercises also help to relieve pain, numbness, and tingling. Sciatic nerve glide 1. Sit in a chair with your head facing down toward your chest. Place your hands behind your back. Let your shoulders slump forward. 2. Slowly straighten one of your legs while you tilt your head back as if you are looking toward the ceiling. Only straighten your leg as far as you can without making your symptoms worse. 3. Hold this position for __________ seconds. 4. Slowly return to the starting position. 5. Repeat with your other leg. Repeat __________ times. Complete this exercise __________ times a day. Knee to chest with hip adduction and internal rotation  1. Lie on your back on a firm surface with both legs straight. 2. Bend one of your knees and move it up toward your chest until you feel a gentle stretch in your lower back and buttock. Then, move your knee toward the shoulder that is on the opposite side from your leg. This is hip adduction and internal rotation. ? Hold your leg in this position by holding on to the front of your knee. 3. Hold this position for __________ seconds. 4. Slowly return to the starting position. 5. Repeat with your other leg. Repeat __________ times. Complete this exercise __________ times a day. Prone extension on elbows  1. Lie on your abdomen on a firm surface. A bed may be too soft for this exercise. 2. Prop yourself up on  your elbows. 3. Use your arms to help lift your chest up until you feel a gentle stretch in your abdomen and your lower back. ? This will place some of your body weight on your elbows. If this is uncomfortable, try stacking pillows under your chest. ? Your hips should stay down, against the surface that you are lying on. Keep your hip and back muscles relaxed. 4. Hold this position for __________ seconds. 5. Slowly relax your upper body and return to the starting position. Repeat __________ times. Complete this exercise __________ times a day. Strengthening exercises These exercises build strength and endurance in your back. Endurance is the ability to use your muscles for a long time, even after they get tired. Pelvic tilt This exercise strengthens the muscles that lie deep in the abdomen. 1. Lie on your back on a firm surface. Bend your knees and keep your feet flat on the floor. 2. Tense your abdominal muscles. Tip your pelvis up toward the ceiling and flatten your lower back into the floor. ? To help with this exercise, you may place a small towel under your lower back and try to push your back into the towel. 3. Hold this position for __________ seconds. 4. Let your muscles relax completely before you repeat this exercise. Repeat __________ times. Complete this exercise __________ times a day. Alternating arm and leg raises  1. Get on your hands and knees on a firm surface. If you are on a hard floor, you may want to use   padding, such as an exercise mat, to cushion your knees. 2. Line up your arms and legs. Your hands should be directly below your shoulders, and your knees should be directly below your hips. 3. Lift your left leg behind you. At the same time, raise your right arm and straighten it in front of you. ? Do not lift your leg higher than your hip. ? Do not lift your arm higher than your shoulder. ? Keep your abdominal and back muscles tight. ? Keep your hips facing the  ground. ? Do not arch your back. ? Keep your balance carefully, and do not hold your breath. 4. Hold this position for __________ seconds. 5. Slowly return to the starting position. 6. Repeat with your right leg and your left arm. Repeat __________ times. Complete this exercise __________ times a day. Posture and body mechanics Good posture and healthy body mechanics can help to relieve stress in your body's tissues and joints. Body mechanics refers to the movements and positions of your body while you do your daily activities. Posture is part of body mechanics. Good posture means:  Your spine is in its natural S-curve position (neutral).  Your shoulders are pulled back slightly.  Your head is not tipped forward. Follow these guidelines to improve your posture and body mechanics in your everyday activities. Standing   When standing, keep your spine neutral and your feet about hip width apart. Keep a slight bend in your knees. Your ears, shoulders, and hips should line up.  When you do a task in which you stand in one place for a long time, place one foot up on a stable object that is 2-4 inches (5-10 cm) high, such as a footstool. This helps keep your spine neutral. Sitting   When sitting, keep your spine neutral and keep your feet flat on the floor. Use a footrest, if necessary, and keep your thighs parallel to the floor. Avoid rounding your shoulders, and avoid tilting your head forward.  When working at a desk or a computer, keep your desk at a height where your hands are slightly lower than your elbows. Slide your chair under your desk so you are close enough to maintain good posture.  When working at a computer, place your monitor at a height where you are looking straight ahead and you do not have to tilt your head forward or downward to look at the screen. Resting  When lying down and resting, avoid positions that are most painful for you.  If you have pain with activities  such as sitting, bending, stooping, or squatting, lie in a position in which your body does not bend very much. For example, avoid curling up on your side with your arms and knees near your chest (fetal position).  If you have pain with activities such as standing for a long time or reaching with your arms, lie with your spine in a neutral position and bend your knees slightly. Try the following positions: ? Lying on your side with a pillow between your knees. ? Lying on your back with a pillow under your knees. Lifting   When lifting objects, keep your feet at least shoulder width apart and tighten your abdominal muscles.  Bend your knees and hips and keep your spine neutral. It is important to lift using the strength of your legs, not your back. Do not lock your knees straight out.  Always ask for help to lift heavy or awkward objects. This information is not   intended to replace advice given to you by your health care provider. Make sure you discuss any questions you have with your health care provider. Document Released: 03/10/2005 Document Revised: 07/02/2018 Document Reviewed: 04/01/2018 Elsevier Patient Education  2020 [employer]  

## 2018-11-23 NOTE — Progress Notes (Signed)
Subjective:    Patient ID: Gabriella Riley, female    DOB: 1975/08/12, 43 y.o.   MRN: [id number]  HPI  Pt presents to the clinic today with c/o tingling in her left leg. She reports this started 3 days ago. It is intermittent, worse when she sits, better when she is laying. She denies numbness or weakness. She denies back pain, issues with bowel or bladder. She did not take anything OTC for this.  She is also due for uric acid level. She reports 24 hours after starting the Prednisone, the redness, pain and swelling in her foot resolved.  Review of Systems      Past Medical History:  Diagnosis Date  . Arthritis     Current Outpatient Medications  Medication Sig Dispense Refill  . clindamycin (CLEOCIN T) 1 % external solution APPLY TO CHIN 1 2X DAILY    . fluticasone (FLONASE) 50 MCG/ACT nasal spray Place 2 sprays into both nostrils daily. 16 g 1  . methocarbamol (ROBAXIN) 500 MG tablet Take 1 tablet (500 mg total) by mouth at bedtime as needed for muscle spasms. 10 tablet 0  . omeprazole (PRILOSEC) 20 MG capsule Take 1 capsule (20 mg total) by mouth daily. 30 capsule 3  . phentermine 37.5 MG capsule Take 1 capsule (37.5 mg total) by mouth every morning. (Patient not taking: Reported on 11/23/2018) 30 capsule 0   No current facility-administered medications for this visit.     Allergies  Allergen Reactions  . Latex Rash    Family History  Problem Relation Age of Onset  . Hyperlipidemia Father   . Hypertension Father   . Heart failure Father   . Arthritis Maternal Grandmother     Social History   Socioeconomic History  . Marital status: Divorced    Spouse name: Not on file  . Number of children: Not on file  . Years of education: Not on file  . Highest education level: Not on file  Occupational History  . Not on file  Social Needs  . Financial resource strain: Not on file  . Food insecurity    Worry: Not on file    Inability: Not on file  .  Transportation needs    Medical: Not on file    Non-medical: Not on file  Tobacco Use  . Smoking status: Never Smoker  . Smokeless tobacco: Never Used  Substance and Sexual Activity  . Alcohol use: Yes    Alcohol/week: 0.0 standard drinks    Comment: social  . Drug use: No  . Sexual activity: Yes    Birth control/protection: None, Surgical    Comment: tubal ligation   Lifestyle  . Physical activity    Days per week: Not on file    Minutes per session: Not on file  . Stress: Not on file  Relationships  . Social Herbalist on phone: Not on file    Gets together: Not on file    Attends religious service: Not on file    Active member of club or organization: Not on file    Attends meetings of clubs or organizations: Not on file    Relationship status: Not on file  . Intimate partner violence    Fear of current or ex partner: Not on file    Emotionally abused: Not on file    Physically abused: Not on file    Forced sexual activity: Not on file  Other Topics Concern  . Not  on file  Social History Narrative  . Not on file     Constitutional: Denies fever, malaise, fatigue, headache or abrupt weight changes.  Respiratory: Denies difficulty breathing, shortness of breath, cough or sputum production.   Cardiovascular: Denies chest pain, chest tightness, palpitations or swelling in the hands or feet.  Gastrointestinal: Denies abdominal pain, bloating, constipation, diarrhea or blood in the stool.  GU: Denies urgency, frequency, pain with urination, burning sensation, blood in urine, odor or discharge. Musculoskeletal: Denies decrease in range of motion, difficulty with gait, muscle pain or joint pain and swelling.  Neurological: Pt reports tingling in left leg. Denies or problems with balance and coordination.   No other specific complaints in a complete review of systems (except as listed in HPI above).  Objective:   Physical Exam   BP 134/78   Pulse 83   Temp  98.3 F (36.8 C) (Temporal)   Wt 257 lb (116.6 kg)   LMP 11/16/2018   SpO2 98%   BMI 40.25 kg/m  Wt Readings from Last 3 Encounters:  11/23/18 257 lb (116.6 kg)  11/17/18 258 lb (117 kg)  10/15/18 251 lb 4 oz (114 kg)    General: Appears her stated age, obese, in NAD. Skin: Warm, dry and intact. No rashes or redness noted. Musculoskeletal: Normal flexion, extension and rotation of the spine. No bony tenderness noted over the spine. Strength 5/5 BLE. No difficulty with gait.  Neurological: Alert and oriented. Negative SLR on the left.   BMET    Component Value Date/Time   NA 138 05/25/2018 1204   NA 138 09/28/2017 1005   K 3.8 05/25/2018 1204   CL 105 05/25/2018 1204   CO2 26 05/25/2018 1204   GLUCOSE 78 05/25/2018 1204   BUN 15 05/25/2018 1204   BUN 12 09/28/2017 1005   CREATININE 0.91 05/25/2018 1204   CALCIUM 9.2 05/25/2018 1204   GFRNONAA 80 09/28/2017 1005   GFRAA 92 09/28/2017 1005    Lipid Panel     Component Value Date/Time   CHOL 185 09/28/2017 1005   TRIG 66 09/28/2017 1005   HDL 59 09/28/2017 1005   CHOLHDL 3.1 09/28/2017 1005   LDLCALC 113 (H) 09/28/2017 1005    CBC    Component Value Date/Time   WBC 10.2 05/25/2018 1204   RBC 5.22 (H) 05/25/2018 1204   HGB 10.8 (L) 05/25/2018 1204   HGB 10.4 (L) 09/28/2017 1005   HCT 34.6 (L) 05/25/2018 1204   HCT 34.0 09/28/2017 1005   PLT 273.0 05/25/2018 1204   PLT 294 09/28/2017 1005   MCV 66.2 (L) 05/25/2018 1204   MCV 68 (L) 09/28/2017 1005   MCH 20.6 (L) 09/28/2017 1005   MCHC 31.3 05/25/2018 1204   RDW 18.7 (H) 05/25/2018 1204   RDW 18.1 (H) 09/28/2017 1005    Hgb A1C Lab Results  Component Value Date   HGBA1C 5.6 05/25/2018           Assessment & Plan:   Swelling of Left MTP:  Resolved with Prednisone Uric acid level today  Paresthesia, Left Leg:  Exam benign Stretching exercises given Can take Ibuprofen 600 mg every 8 hours as needed Notify me if persistent or worsening   Return precautions discussed Webb Silversmith, NP

## 2018-11-30 ENCOUNTER — Telehealth: Payer: Self-pay | Admitting: *Deleted

## 2018-11-30 MED ORDER — FLUCONAZOLE 150 MG PO TABS: 150.0000 mg | ORAL_TABLET | Freq: Once | ORAL | 1 refills | Status: AC

## 2018-11-30 NOTE — Telephone Encounter (Signed)
Pt called stating she was treated for BV and is now having some vaginal irritation. Will send in RX for yeast. Ifd not better after that to call office back to be re evaluated.

## 2018-12-09 ENCOUNTER — Encounter: Payer: Self-pay | Admitting: Internal Medicine

## 2018-12-09 ENCOUNTER — Other Ambulatory Visit: Payer: Self-pay

## 2018-12-09 ENCOUNTER — Ambulatory Visit: Payer: BC Managed Care – PPO | Admitting: Internal Medicine

## 2018-12-09 VITALS — BP 128/84 | HR 78 | Temp 98.4°F | Wt 259.0 lb

## 2018-12-09 DIAGNOSIS — Z6841 Body Mass Index (BMI) 40.0 and over, adult: Secondary | ICD-10-CM

## 2018-12-09 NOTE — Patient Instructions (Signed)
Bariatric Surgery Information Bariatric surgery, also called weight loss surgery, is a procedure that helps you lose weight. You may consider, or your health care provider may suggest, bariatric surgery if:  You are severely obese and have been unable to lose weight through diet and exercise.  You have health problems related to obesity, such as: ? Type 2 diabetes. ? Heart disease. ? Lung disease. How does bariatric surgery help me lose weight? Bariatric surgery helps you lose weight by:  Decreasing how much food your body absorbs. This is done by closing off part of your stomach to make it smaller. This restricts the amount of food your stomach can hold.  Changing your body's regular digestive process so that food bypasses the parts of your body that absorb calories and nutrients. If you decide to have bariatric surgery, it is important to continue to eat a healthy diet and exercise regularly after the surgery. What are the different kinds of bariatric surgery? There are two kinds of bariatric surgeries:  Restrictive surgery. This procedure makes your stomach smaller. It does not change your digestive process. The smaller the size of your new stomach, the less food you can eat. There are different types of restrictive surgeries.  Malabsorptive surgery. This procedure makes your stomach smaller and alters your digestive process so that your body processes less calories and nutrients. These are the most common kind of bariatric surgery. There are different types of malabsorptive surgeries. What are the different types of restrictive surgery? Adjustable Gastric Banding In this procedure, an inflatable band is placed around your stomach near the upper end. This makes the passageway for food into the rest of your stomach much smaller. The band can be adjusted, making it tighter or looser, by filling it with salt solution. Your surgeon can adjust the band based on how you are feeling and how much  weight you are losing. The band can be removed in the future. This requires another surgery. Sleeve Gastrectomy In this procedure, your stomach is made smaller. This is done by surgically removing a large part of your stomach. When your stomach is smaller, you feel full more quickly and reduce how much you eat. What are the different types of malabsorptive surgery?  Roux-en-Y Gastric Bypass (RGB) This is the most common weight loss surgery. In this procedure, a small stomach pouch (gastric pouch) is created in the upper part of your stomach. Next, this gastric pouch is attached directly to the middle part of your small intestine. The farther down your small intestine the new connection is made, the fewer calories and nutrients you will absorb. This surgery has the highest rate of complications. Biliopancreatic Diversion with Duodenal Switch (BPD/DS) This is a multi-step procedure. First, a large part of your stomach is removed, making your stomach smaller. Next, this smaller stomach is attached to the lower part of your small intestine. Like the RGB surgery, you absorb fewer calories and nutrients the farther down your small intestine the attachment is made. What are the risks of bariatric surgery? As with any surgical procedure, each type of bariatric surgery has its own risks. These risks also depend on your age, your overall health, and any other medical conditions you may have. When deciding on bariatric surgery, it is very important to:  Talk to your health care provider and choose the surgery that is best for you.  Ask your health care provider about specific risks for the surgery you choose. Generally, the risks of bariatric surgery include:  Infection.  Bleeding.  Not getting enough nutrients from food (nutritional deficiencies).  Failure of the device or procedure. This may require another surgery to correct the problem. Where to find more information  American Society for  Metabolic & Bariatric Surgery: www.asmbs.org  Weight-control Information Network (WIN): win.AmenCredit.is Summary  Bariatric surgery, also called weight loss surgery, is a procedure that helps you lose weight.  This surgery may be recommended if you have diabetes, heart disease, or lung disease.  Generally, risks of bariatric surgery include infection, bleeding, and failure of the surgery or device, which may require another surgery to correct the problem. This information is not intended to replace advice given to you by your health care provider. Make sure you discuss any questions you have with your health care provider. Document Released: 03/10/2005 Document Revised: 06/29/2018 Document Reviewed: 04/14/2016 Elsevier Patient Education  2020 [employer].

## 2018-12-09 NOTE — Progress Notes (Signed)
Subjective:    Patient ID: Gabriella Riley, female    DOB: 01/22/1976, 43 y.o.   MRN: [id number]  HPI  Pt presents to the clinic today to discuss her weight loss options. She has been on Phentermine in the past, as recently as 08/2018. Her weight today is  259 lbs with a BMI of 40.57. She has tried Keto and [employer] in the past without much success. She has GERD, exacerbated by her weight and intermittent back and knee pain.  Breakfast: Eggs with Meat and Cheese, Fruit and Yogurt Lunch: Salad, [place], [place], [place] Dinner: Spaghetti, Chicken, [place] Snacks: Nuts, Trail Mix Exercise: None  Review of Systems      Past Medical History:  Diagnosis Date  . Arthritis     Current Outpatient Medications  Medication Sig Dispense Refill  . clindamycin (CLEOCIN T) 1 % external solution APPLY TO CHIN 1 2X DAILY    . fluticasone (FLONASE) 50 MCG/ACT nasal spray Place 2 sprays into both nostrils daily. 16 g 1  . methocarbamol (ROBAXIN) 500 MG tablet Take 1 tablet (500 mg total) by mouth at bedtime as needed for muscle spasms. 10 tablet 0  . omeprazole (PRILOSEC) 20 MG capsule Take 1 capsule (20 mg total) by mouth daily. 30 capsule 3  . phentermine 37.5 MG capsule Take 1 capsule (37.5 mg total) by mouth every morning. (Patient not taking: Reported on 11/23/2018) 30 capsule 0   No current facility-administered medications for this visit.     Allergies  Allergen Reactions  . Latex Rash    Family History  Problem Relation Age of Onset  . Hyperlipidemia Father   . Hypertension Father   . Heart failure Father   . Arthritis Maternal Grandmother     Social History   Socioeconomic History  . Marital status: Divorced    Spouse name: Not on file  . Number of children: Not on file  . Years of education: Not on file  . Highest education level: Not on file  Occupational History  . Not on file  Social Needs  . Financial resource strain: Not on file  . Food insecurity    Worry: Not on file    Inability: Not on file  . Transportation needs    Medical: Not on file    Non-medical: Not on file  Tobacco Use  . Smoking status: Never Smoker  . Smokeless tobacco: Never Used  Substance and Sexual Activity  . Alcohol use: Yes    Alcohol/week: 0.0 standard drinks    Comment: social  . Drug use: No  . Sexual activity: Yes    Birth control/protection: None, Surgical    Comment: tubal ligation   Lifestyle  . Physical activity    Days per week: Not on file    Minutes per session: Not on file  . Stress: Not on file  Relationships  . Social Herbalist on phone: Not on file    Gets together: Not on file    Attends religious service: Not on file    Active member of club or organization: Not on file    Attends meetings of clubs or organizations: Not on file    Relationship status: Not on file  . Intimate partner violence    Fear of current or ex partner: Not on file    Emotionally abused: Not on file    Physically abused: Not on file    Forced sexual activity: Not on file  Other Topics Concern  . Not on file  Social History Narrative  . Not on file     Constitutional: Denies fever, malaise, fatigue, headache or abrupt weight changes.  Respiratory: Denies difficulty breathing, shortness of breath, cough or sputum production.   Cardiovascular: Denies chest pain, chest tightness, palpitations or swelling in the hands or feet.  Gastrointestinal: Pt reports intermittent reflux. Denies abdominal pain, bloating, constipation, diarrhea or blood in the stool.  GU: Denies urgency, frequency, pain with urination, burning sensation, blood in urine, odor or discharge. Musculoskeletal: Pt reports intermittent knee and back pain. Denies decrease in range of motion, difficulty with gait, or joint swelling.    No other specific complaints in a complete review of systems (except as listed in HPI above).  Objective:   Physical Exam   BP 128/84   Pulse 78    Temp 98.4 F (36.9 C) (Temporal)   Wt 259 lb (117.5 kg)   LMP 11/16/2018   SpO2 98%   BMI 40.57 kg/m  Wt Readings from Last 3 Encounters:  12/09/18 259 lb (117.5 kg)  11/23/18 257 lb (116.6 kg)  11/17/18 258 lb (117 kg)    General: Appears her stated age, obese, in NAD. Cardiovascular: Normal rate and rhythm.  Pulmonary/Chest: Normal effort and positive vesicular breath sounds. No respiratory distress. No wheezes, rales or ronchi noted.  Abdomen: Soft and nontender. Normal bowel sounds. No distention or masses noted.  MSK: No difficulty with gait.  Neurological: Alert and oriented.    BMET    Component Value Date/Time   NA 138 05/25/2018 1204   NA 138 09/28/2017 1005   K 3.8 05/25/2018 1204   CL 105 05/25/2018 1204   CO2 26 05/25/2018 1204   GLUCOSE 78 05/25/2018 1204   BUN 15 05/25/2018 1204   BUN 12 09/28/2017 1005   CREATININE 0.91 05/25/2018 1204   CALCIUM 9.2 05/25/2018 1204   GFRNONAA 80 09/28/2017 1005   GFRAA 92 09/28/2017 1005    Lipid Panel     Component Value Date/Time   CHOL 185 09/28/2017 1005   TRIG 66 09/28/2017 1005   HDL 59 09/28/2017 1005   CHOLHDL 3.1 09/28/2017 1005   LDLCALC 113 (H) 09/28/2017 1005    CBC    Component Value Date/Time   WBC 10.2 05/25/2018 1204   RBC 5.22 (H) 05/25/2018 1204   HGB 10.8 (L) 05/25/2018 1204   HGB 10.4 (L) 09/28/2017 1005   HCT 34.6 (L) 05/25/2018 1204   HCT 34.0 09/28/2017 1005   PLT 273.0 05/25/2018 1204   PLT 294 09/28/2017 1005   MCV 66.2 (L) 05/25/2018 1204   MCV 68 (L) 09/28/2017 1005   MCH 20.6 (L) 09/28/2017 1005   MCHC 31.3 05/25/2018 1204   RDW 18.7 (H) 05/25/2018 1204   RDW 18.1 (H) 09/28/2017 1005    Hgb A1C Lab Results  Component Value Date   HGBA1C 5.6 05/25/2018           Assessment & Plan:   Morbid Obesity, GERD:  Referral to [facility] to discuss bariatric surgery Discussed high protein, low carb diet Discussed need for 150 minutes of exercise per week  RTC  in 1 month for weight check  Webb Silversmith, NP

## 2018-12-21 ENCOUNTER — Other Ambulatory Visit: Payer: Self-pay

## 2018-12-21 ENCOUNTER — Ambulatory Visit (HOSPITAL_COMMUNITY)
Admission: EM | Admit: 2018-12-21 | Discharge: 2018-12-21 | Disposition: A | Discharge status: Home / Self Care | Payer: BC Managed Care – PPO | Attending: Family Medicine | Admitting: Family Medicine

## 2018-12-21 ENCOUNTER — Encounter (HOSPITAL_COMMUNITY): Payer: Self-pay | Admitting: Emergency Medicine

## 2018-12-21 DIAGNOSIS — R030 Elevated blood-pressure reading, without diagnosis of hypertension: Secondary | ICD-10-CM | POA: Diagnosis not present

## 2018-12-21 DIAGNOSIS — T63421A Toxic effect of venom of ants, accidental (unintentional), initial encounter: Secondary | ICD-10-CM | POA: Diagnosis not present

## 2018-12-21 MED ORDER — TRIAMCINOLONE ACETONIDE 0.1 % EX CREA: 1.0000 "application " | TOPICAL_CREAM | Freq: Two times a day (BID) | CUTANEOUS | 0 refills | Status: DC

## 2018-12-21 NOTE — Discharge Instructions (Addendum)
Use triamcinolone cream 2 times a day.  This is a good cortisone cream to use on bites and rashes Elevate foot if you develop swelling These will heal on their own  Follow-up on your elevated blood pressure.  It does not require treatment today, but you need to keep track of blood pressure and discuss this with your PCP

## 2018-12-21 NOTE — ED Provider Notes (Signed)
Gabriella Riley    CSN: [id number] Arrival date & time: 12/21/18  1843      History   Chief Complaint Chief Complaint  Patient presents with  . Appointment    18:50  . Rash    HPI Gabriella Riley is a 43 y.o. female.   HPI  Patient felt stinging and looked down and saw ants on her foot.  She brushed them off.  Today she has multiple bites.  She states there are multiple ant colonies in her yard.  I told her that sometimes they are difficult to get rid of.  She is here to see if anything could be done for the bites.  She has some swelling in her foot.  She is otherwise healthy. Blood pressure initially elevated.  Came down with rest.  It still borderline he needs to be followed up.  Past Medical History:  Diagnosis Date  . Arthritis     Patient Active Problem List   Diagnosis Date Noted  . Menorrhagia with regular cycle 02/04/2018  . Arthritis of both knees 01/02/2015    Past Surgical History:  Procedure Laterality Date  . BREAST BIOPSY Left 2007  . BREAST EXCISIONAL BIOPSY    . TUBAL LIGATION      OB History    Gravida  4   Para  4   Term  4   Preterm  0   AB  0   Living  4     SAB  0   TAB  0   Ectopic  0   Multiple  0   Live Births  4            Home Medications    Prior to Admission medications   Medication Sig Start Date End Date Taking? Authorizing Provider  triamcinolone cream (KENALOG) 0.1 % Apply 1 application topically 2 (two) times daily. 12/21/18   Raylene Everts, MD  fluticasone [facility]) 50 MCG/ACT nasal spray Place 2 sprays into both nostrils daily. 10/15/18 12/21/18  Ria Bush, MD  omeprazole (PRILOSEC) 20 MG capsule Take 1 capsule (20 mg total) by mouth daily. 05/25/18 12/21/18  Jearld Fenton, NP    Family History Family History  Problem Relation Age of Onset  . Hyperlipidemia Father   . Hypertension Father   . Heart failure Father   . Arthritis Maternal Grandmother     Social History  Social History   Tobacco Use  . Smoking status: Never Smoker  . Smokeless tobacco: Never Used  Substance Use Topics  . Alcohol use: Yes    Alcohol/week: 0.0 standard drinks    Comment: social  . Drug use: No     Allergies   Latex   Review of Systems Review of Systems  Constitutional: Negative for chills and fever.  HENT: Negative for ear pain and sore throat.   Eyes: Negative for pain and visual disturbance.  Respiratory: Negative for cough and shortness of breath.   Cardiovascular: Negative for chest pain and palpitations.  Gastrointestinal: Negative for abdominal pain and vomiting.  Genitourinary: Negative for dysuria and hematuria.  Musculoskeletal: Negative for arthralgias and back pain.  Skin: Positive for rash. Negative for color change.  Neurological: Negative for seizures and syncope.  All other systems reviewed and are negative.    Physical Exam Triage Vital Signs ED Triage Vitals  Enc Vitals Group     BP 12/21/18 1903 (!) 164/102     Pulse Rate 12/21/18 1903 77  Resp 12/21/18 1903 18     Temp 12/21/18 1903 97.7 F (36.5 C)     Temp Source 12/21/18 1903 Skin     SpO2 12/21/18 1903 100 %     Weight --      Height --      Head Circumference --      Peak Flow --      Pain Score 12/21/18 1859 3     Pain Loc --      Pain Edu? --      Excl. in [place]? --    No data found.  Updated Vital Signs BP (!) 148/95 (BP Location: Left Arm) Comment: large cuff  Pulse 77   Temp 97.7 F (36.5 C) (Skin)   Resp 18   LMP 12/14/2018   SpO2 100%       Physical Exam Constitutional:      General: She is not in acute distress.    Appearance: She is well-developed.  HENT:     Head: Normocephalic and atraumatic.  Eyes:     Conjunctiva/sclera: Conjunctivae normal.     Pupils: Pupils are equal, round, and reactive to light.  Neck:     Musculoskeletal: Normal range of motion.  Cardiovascular:     Rate and Rhythm: Normal rate.  Pulmonary:     Effort: Pulmonary  effort is normal. No respiratory distress.  Abdominal:     General: There is no distension.     Palpations: Abdomen is soft.  Musculoskeletal: Normal range of motion.  Skin:    General: Skin is warm and dry.     Comments: Left foot has 10 scattered pustules distally with mild soft tissue swelling.  No erythema warmth or tenderness  Neurological:     Mental Status: She is alert.      UC Treatments / Results  Labs (all labs ordered are listed, but only abnormal results are displayed) Labs Reviewed - No data to display  EKG   Radiology No results found.  Procedures Procedures (including critical care time)  Medications Ordered in UC Medications - No data to display  Initial Impression / Assessment and Plan / UC Course  I have reviewed the triage vital signs and the nursing notes.  Pertinent labs & imaging results that were available during my care of the patient were reviewed by me and considered in my medical decision making (see chart for details).      Final Clinical Impressions(s) / UC Diagnoses   Final diagnoses:  Fire ant bite, accidental or unintentional, initial encounter  Elevated blood-pressure reading without diagnosis of hypertension     Discharge Instructions     Use triamcinolone cream 2 times a day.  This is a good cortisone cream to use on bites and rashes Elevate foot if you develop swelling These will heal on their own  Follow-up on your elevated blood pressure.  It does not require treatment today, but you need to keep track of blood pressure and discuss this with your PCP   ED Prescriptions    Medication Sig Dispense Auth. Provider   triamcinolone cream (KENALOG) 0.1 % Apply 1 application topically 2 (two) times daily. 30 g Raylene Everts, MD     PDMP not reviewed this encounter.   Raylene Everts, MD 12/21/18 [phone]

## 2018-12-21 NOTE — ED Triage Notes (Signed)
Sunday had an episode of tingling to left foot, could not see and think of anything that had caused a problem.  Left foot has scattered bumps, white tops to bumps.  No bumps on bottom of foot.  Reports foot is "stinging"

## 2018-12-22 ENCOUNTER — Ambulatory Visit: Payer: BC Managed Care – PPO | Admitting: Internal Medicine

## 2019-01-07 ENCOUNTER — Other Ambulatory Visit (HOSPITAL_COMMUNITY): Payer: Self-pay | Admitting: General Surgery

## 2019-01-07 ENCOUNTER — Other Ambulatory Visit: Payer: Self-pay | Admitting: General Surgery

## 2019-01-12 ENCOUNTER — Ambulatory Visit: Payer: BC Managed Care – PPO | Admitting: Dietician

## 2019-01-20 ENCOUNTER — Encounter (HOSPITAL_COMMUNITY): Payer: Self-pay

## 2019-01-20 ENCOUNTER — Ambulatory Visit (HOSPITAL_COMMUNITY): Admission: RE | Admit: 2019-01-20 | Payer: BC Managed Care – PPO | Source: Ambulatory Visit

## 2019-01-27 ENCOUNTER — Other Ambulatory Visit: Payer: Self-pay

## 2019-01-27 ENCOUNTER — Encounter: Payer: Self-pay | Admitting: Internal Medicine

## 2019-01-27 ENCOUNTER — Ambulatory Visit: Payer: BC Managed Care – PPO | Admitting: Internal Medicine

## 2019-01-27 VITALS — BP 136/84 | HR 91 | Temp 98.7°F | Wt 264.0 lb

## 2019-01-27 DIAGNOSIS — N644 Mastodynia: Secondary | ICD-10-CM

## 2019-01-27 NOTE — Patient Instructions (Signed)

## 2019-01-27 NOTE — Progress Notes (Signed)
Subjective:    Patient ID: Gabriella Riley, female    DOB: [birth year], 43 y.o.   MRN: [id number]  HPI  Pt presents to the clinic today with c/o left breast pain. She reports this started 2 weeks ago. She has not noticed any mass of the breast, changes in the skin of the breast or discharge from the nipple. She had a diagnostic mammogram and [place] of her left breast 03/2018 for the same issue. The report came back as benign but she reports this is the same pain, in the same place.  Review of Systems      Past Medical History:  Diagnosis Date  . Arthritis     Current Outpatient Medications  Medication Sig Dispense Refill  . triamcinolone cream (KENALOG) 0.1 % Apply 1 application topically 2 (two) times daily. 30 g 0   No current facility-administered medications for this visit.     Allergies  Allergen Reactions  . Latex Rash    Family History  Problem Relation Age of Onset  . Hyperlipidemia Father   . Hypertension Father   . Heart failure Father   . Arthritis Maternal Grandmother     Social History   Socioeconomic History  . Marital status: Divorced    Spouse name: Not on file  . Number of children: Not on file  . Years of education: Not on file  . Highest education level: Not on file  Occupational History  . Not on file  Social Needs  . Financial resource strain: Not on file  . Food insecurity    Worry: Not on file    Inability: Not on file  . Transportation needs    Medical: Not on file    Non-medical: Not on file  Tobacco Use  . Smoking status: Never Smoker  . Smokeless tobacco: Never Used  Substance and Sexual Activity  . Alcohol use: Yes    Alcohol/week: 0.0 standard drinks    Comment: social  . Drug use: No  . Sexual activity: Yes    Birth control/protection: None, Surgical    Comment: tubal ligation   Lifestyle  . Physical activity    Days per week: Not on file    Minutes per session: Not on file  . Stress: Not on file   Relationships  . Social Herbalist on phone: Not on file    Gets together: Not on file    Attends religious service: Not on file    Active member of club or organization: Not on file    Attends meetings of clubs or organizations: Not on file    Relationship status: Not on file  . Intimate partner violence    Fear of current or ex partner: Not on file    Emotionally abused: Not on file    Physically abused: Not on file    Forced sexual activity: Not on file  Other Topics Concern  . Not on file  Social History Narrative  . Not on file     Constitutional: Denies fever, malaise, fatigue, headache or abrupt weight changes.  Respiratory: Denies difficulty breathing, shortness of breath, cough or sputum production.   Cardiovascular: Denies chest pain, chest tightness, palpitations or swelling in the hands or feet.  Skin: Pt reports left breast pain. Denies redness, rashes, lesions or ulcercations.    No other specific complaints in a complete review of systems (except as listed in HPI above).  Objective:   Physical Exam  BP  136/84   Pulse 91   Temp 98.7 F (37.1 C) (Temporal)   Wt 264 lb (119.7 kg)   LMP 01/08/2019   SpO2 98%   BMI 41.35 kg/m   Wt Readings from Last 3 Encounters:  12/09/18 259 lb (117.5 kg)  11/23/18 257 lb (116.6 kg)  11/17/18 258 lb (117 kg)    General: Appears her stated age, obese, in NAD. Skin: Warm, dry and intact. No rashes, lesions or ulcerations noted of the left breast. Cardiovascular: Normal rate and rhythm. S1,S2 noted.  No murmur, rubs or gallops noted.  Pulmonary/Chest: Normal effort and positive vesicular breath sounds. No respiratory distress. No wheezes, rales or ronchi noted.  Breast: Symmetrical. Fibrocystic changes noted bilaterally. No mass noted. No axillary lymphadenopathy. Neurological: Alert and oriented.   BMET    Component Value Date/Time   NA 138 05/25/2018 1204   NA 138 09/28/2017 1005   K 3.8 05/25/2018 1204    CL 105 05/25/2018 1204   CO2 26 05/25/2018 1204   GLUCOSE 78 05/25/2018 1204   BUN 15 05/25/2018 1204   BUN 12 09/28/2017 1005   CREATININE 0.91 05/25/2018 1204   CALCIUM 9.2 05/25/2018 1204   GFRNONAA 80 09/28/2017 1005   GFRAA 92 09/28/2017 1005    Lipid Panel     Component Value Date/Time   CHOL 185 09/28/2017 1005   TRIG 66 09/28/2017 1005   HDL 59 09/28/2017 1005   CHOLHDL 3.1 09/28/2017 1005   LDLCALC 113 (H) 09/28/2017 1005    CBC    Component Value Date/Time   WBC 10.2 05/25/2018 1204   RBC 5.22 (H) 05/25/2018 1204   HGB 10.8 (L) 05/25/2018 1204   HGB 10.4 (L) 09/28/2017 1005   HCT 34.6 (L) 05/25/2018 1204   HCT 34.0 09/28/2017 1005   PLT 273.0 05/25/2018 1204   PLT 294 09/28/2017 1005   MCV 66.2 (L) 05/25/2018 1204   MCV 68 (L) 09/28/2017 1005   MCH 20.6 (L) 09/28/2017 1005   MCHC 31.3 05/25/2018 1204   RDW 18.7 (H) 05/25/2018 1204   RDW 18.1 (H) 09/28/2017 1005    Hgb A1C Lab Results  Component Value Date   HGBA1C 5.6 05/25/2018            Assessment & Plan:   Left Breast Pain:  Will obtain diagnostic mammogram and ultrasound of the left breast  Will follow up after imaging, return precautions discussed Webb Silversmith, NP

## 2019-02-02 ENCOUNTER — Other Ambulatory Visit: Payer: Self-pay

## 2019-02-02 ENCOUNTER — Ambulatory Visit
Admission: RE | Admit: 2019-02-02 | Discharge: 2019-02-02 | Disposition: A | Discharge status: Home / Self Care | Payer: BC Managed Care – PPO | Source: Ambulatory Visit | Attending: Internal Medicine | Admitting: Internal Medicine

## 2019-02-02 ENCOUNTER — Ambulatory Visit: Payer: BC Managed Care – PPO

## 2019-02-02 DIAGNOSIS — N644 Mastodynia: Secondary | ICD-10-CM

## 2019-02-08 ENCOUNTER — Ambulatory Visit: Payer: BC Managed Care – PPO | Admitting: Obstetrics & Gynecology

## 2019-02-21 ENCOUNTER — Other Ambulatory Visit: Payer: Self-pay

## 2019-02-21 ENCOUNTER — Encounter: Payer: Self-pay | Admitting: Obstetrics & Gynecology

## 2019-02-21 ENCOUNTER — Ambulatory Visit (INDEPENDENT_AMBULATORY_CARE_PROVIDER_SITE_OTHER): Payer: BC Managed Care – PPO | Admitting: Obstetrics & Gynecology

## 2019-02-21 VITALS — BP 148/90 | HR 74 | Ht 67.0 in | Wt 262.0 lb

## 2019-02-21 DIAGNOSIS — B9689 Other specified bacterial agents as the cause of diseases classified elsewhere: Secondary | ICD-10-CM | POA: Diagnosis not present

## 2019-02-21 DIAGNOSIS — Z113 Encounter for screening for infections with a predominantly sexual mode of transmission: Secondary | ICD-10-CM | POA: Diagnosis not present

## 2019-02-21 DIAGNOSIS — B373 Candidiasis of vulva and vagina: Secondary | ICD-10-CM

## 2019-02-21 DIAGNOSIS — N898 Other specified noninflammatory disorders of vagina: Secondary | ICD-10-CM

## 2019-02-21 DIAGNOSIS — Z1151 Encounter for screening for human papillomavirus (HPV): Secondary | ICD-10-CM | POA: Diagnosis not present

## 2019-02-21 DIAGNOSIS — N760 Acute vaginitis: Secondary | ICD-10-CM

## 2019-02-21 DIAGNOSIS — B3731 Acute candidiasis of vulva and vagina: Secondary | ICD-10-CM

## 2019-02-21 DIAGNOSIS — Z124 Encounter for screening for malignant neoplasm of cervix: Secondary | ICD-10-CM | POA: Diagnosis not present

## 2019-02-21 DIAGNOSIS — Z01419 Encounter for gynecological examination (general) (routine) without abnormal findings: Secondary | ICD-10-CM

## 2019-02-21 DIAGNOSIS — Z1231 Encounter for screening mammogram for malignant neoplasm of breast: Secondary | ICD-10-CM

## 2019-02-21 NOTE — Patient Instructions (Addendum)
Preventive Care 40-42 Years Old, Female Preventive care refers to visits with your health care provider and lifestyle choices that can promote health and wellness. This includes:  A yearly physical exam. This may also be called an annual well check.  Regular dental visits and eye exams.  Immunizations.  Screening for certain conditions.  Healthy lifestyle choices, such as eating a healthy diet, getting regular exercise, not using drugs or products that contain nicotine and tobacco, and limiting alcohol use. What can I expect for my preventive care visit? Physical exam Your health care provider will check your:  Height and weight. This may be used to calculate body mass index (BMI), which tells if you are at a healthy weight.  Heart rate and blood pressure.  Skin for abnormal spots. Counseling Your health care provider may ask you questions about your:  Alcohol, tobacco, and drug use.  Emotional well-being.  Home and relationship well-being.  Sexual activity.  Eating habits.  Work and work environment.  Method of birth control.  Menstrual cycle.  Pregnancy history. What immunizations do I need?  Influenza (flu) vaccine  This is recommended every year. Tetanus, diphtheria, and pertussis (Tdap) vaccine  You may need a Td booster every 10 years. Varicella (chickenpox) vaccine  You may need this if you have not been vaccinated. Zoster (shingles) vaccine  You may need this after age 60. Measles, mumps, and rubella (MMR) vaccine  You may need at least one dose of MMR if you were born in 1957 or later. You may also need a second dose. Pneumococcal conjugate (PCV13) vaccine  You may need this if you have certain conditions and were not previously vaccinated. Pneumococcal polysaccharide (PPSV23) vaccine  You may need one or two doses if you smoke cigarettes or if you have certain conditions. Meningococcal conjugate (MenACWY) vaccine  You may need this if you  have certain conditions. Hepatitis A vaccine  You may need this if you have certain conditions or if you travel or work in places where you may be exposed to hepatitis A. Hepatitis B vaccine  You may need this if you have certain conditions or if you travel or work in places where you may be exposed to hepatitis B. Haemophilus influenzae type b (Hib) vaccine  You may need this if you have certain conditions. Human papillomavirus (HPV) vaccine  If recommended by your health care provider, you may need three doses over 6 months. You may receive vaccines as individual doses or as more than one vaccine together in one shot (combination vaccines). Talk with your health care provider about the risks and benefits of combination vaccines. What tests do I need? Blood tests  Lipid and cholesterol levels. These may be checked every 5 years, or more frequently if you are over 50 years old.  Hepatitis C test.  Hepatitis B test. Screening  Lung cancer screening. You may have this screening every year starting at age 55 if you have a 30-pack-year history of smoking and currently smoke or have quit within the past 15 years.  Colorectal cancer screening. All adults should have this screening starting at age 50 and continuing until age 75. Your health care provider may recommend screening at age 45 if you are at increased risk. You will have tests every 1-10 years, depending on your results and the type of screening test.  Diabetes screening. This is done by checking your blood sugar (glucose) after you have not eaten for a while (fasting). You may have this   done every 1-3 years.  Mammogram. This may be done every 1-2 years. Talk with your health care provider about when you should start having regular mammograms. This may depend on whether you have a family history of breast cancer.  BRCA-related cancer screening. This may be done if you have a family history of breast, ovarian, tubal, or peritoneal  cancers.  Pelvic exam and Pap test. This may be done every 3 years starting at age 23. Starting at age 47, this may be done every 5 years if you have a Pap test in combination with an HPV test. Other tests  Sexually transmitted disease (STD) testing.  Bone density scan. This is done to screen for osteoporosis. You may have this scan if you are at high risk for osteoporosis. Follow these instructions at home: Eating and drinking  Eat a diet that includes fresh fruits and vegetables, whole grains, lean protein, and low-fat dairy.  Take vitamin and mineral supplements as recommended by your health care provider.  Do not drink alcohol if: ? Your health care provider tells you not to drink. ? You are pregnant, may be pregnant, or are planning to become pregnant.  If you drink alcohol: ? Limit how much you have to 0-1 drink a day. ? Be aware of how much alcohol is in your drink. In the [place] one drink equals one 12 oz bottle of beer (355 mL), one 5 oz glass of wine (148 mL), or one 1 oz glass of hard liquor (44 mL). Lifestyle  Take daily care of your teeth and gums.  Stay active. Exercise for at least 30 minutes on 5 or more days each week.  Do not use any products that contain nicotine or tobacco, such as cigarettes, e-cigarettes, and chewing tobacco. If you need help quitting, ask your health care provider.  If you are sexually active, practice safe sex. Use a condom or other form of birth control (contraception) in order to prevent pregnancy and STIs (sexually transmitted infections).  If told by your health care provider, take low-dose aspirin daily starting at age 29. What's next?  Visit your health care provider once a year for a well check visit.  Ask your health care provider how often you should have your eyes and teeth checked.  Stay up to date on all vaccines. This information is not intended to replace advice given to you by your health care provider. Make sure you  discuss any questions you have with your health care provider. Document Released: 04/06/2015 Document Revised: 11/19/2017 Document Reviewed: 11/19/2017 Elsevier Patient Education  [place].   Vaginitis Vaginitis is a condition in which the vaginal tissue swells and becomes red (inflamed). This condition is most often caused by a change in the normal balance of bacteria and yeast that live in the vagina. This change causes an overgrowth of certain bacteria or yeast, which causes the inflammation. There are different types of vaginitis, but the most common types are:  Bacterial vaginosis.  Yeast infection (candidiasis).  Trichomoniasis vaginitis. This is a sexually transmitted disease (STD).  Viral vaginitis.  Atrophic vaginitis.  Allergic vaginitis. What are the causes? The cause of this condition depends on the type of vaginitis. It can be caused by:  Bacteria (bacterial vaginosis).  Yeast, which is a fungus (yeast infection).  A parasite (trichomoniasis vaginitis).  A virus (viral vaginitis).  Low hormone levels (atrophic vaginitis). Low hormone levels can occur during pregnancy, breastfeeding, or after menopause.  Irritants, such as bubble  baths, scented tampons, and feminine sprays (allergic vaginitis). Other factors can change the normal balance of the yeast and bacteria that live in the vagina. These include:  Antibiotic medicines.  Poor hygiene.  Diaphragms, vaginal sponges, spermicides, birth control pills, and intrauterine devices (IUD).  Sex.  Infection.  Uncontrolled diabetes.  A weakened defense (immune) system. What increases the risk? This condition is more likely to develop in women who:  Smoke.  Use vaginal douches, scented tampons, or scented sanitary pads.  Wear tight-fitting pants.  Wear thong underwear.  Use oral birth control pills or an IUD.  Have sex without a condom.  Have multiple sex partners.  Have an STD.   Frequently use the spermicide nonoxynol-9.  Eat lots of foods high in sugar.  Have uncontrolled diabetes.  Have low estrogen levels.  Have a weakened immune system from an immune disorder or medical treatment.  Are pregnant or breastfeeding. What are the signs or symptoms? Symptoms vary depending on the cause of the vaginitis. Common symptoms include:  Abnormal vaginal discharge. ? The discharge is white, gray, or yellow with bacterial vaginosis. ? The discharge is thick, white, and cheesy with a yeast infection. ? The discharge is frothy and yellow or greenish with trichomoniasis.  A bad vaginal smell. The smell is fishy with bacterial vaginosis.  Vaginal itching, pain, or swelling.  Sex that is painful.  Pain or burning when urinating. Sometimes there are no symptoms. How is this diagnosed? This condition is diagnosed based on your symptoms and medical history. A physical exam, including a pelvic exam, will also be done. You may also have other tests, including:  Tests to determine the pH level (acidity or alkalinity) of your vagina.  A whiff test, to assess the odor that results when a sample of your vaginal discharge is mixed with a potassium hydroxide solution.  Tests of vaginal fluid. A sample will be examined under a microscope. How is this treated? Treatment varies depending on the type of vaginitis you have. Your treatment may include:  Antibiotic creams or pills to treat bacterial vaginosis and trichomoniasis.  Antifungal medicines, such as vaginal creams or suppositories, to treat a yeast infection.  Medicine to ease discomfort if you have viral vaginitis. Your sexual partner should also be treated.  Estrogen delivered in a cream, pill, suppository, or vaginal ring to treat atrophic vaginitis. If vaginal dryness occurs, lubricants and moisturizing creams may help. You may need to avoid scented soaps, sprays, or douches.  Stopping use of a product that is  causing allergic vaginitis. Then using a vaginal cream to treat the symptoms. Follow these instructions at home: Lifestyle  Keep your genital area clean and dry. Avoid soap, and only rinse the area with water.  Do not douche or use tampons until your health care provider says it is okay to do so. Use sanitary pads, if needed.  Do not have sex until your health care provider approves. When you can return to sex, practice safe sex and use condoms.  Wipe from front to back. This avoids the spread of bacteria from the rectum to the vagina. General instructions  Take over-the-counter and prescription medicines only as told by your health care provider.  If you were prescribed an antibiotic medicine, take or use it as told by your health care provider. Do not stop taking or using the antibiotic even if you start to feel better.  Keep all follow-up visits as told by your health care provider. This is important.  How is this prevented?  Use mild, non-scented products. Do not use things that can irritate the vagina, such as fabric softeners. Avoid the following products if they are scented: ? Feminine sprays. ? Detergents. ? Tampons. ? Feminine hygiene products. ? Soaps or bubble baths.  Let air reach your genital area. ? Wear cotton underwear to reduce moisture buildup. ? Avoid wearing underwear while you sleep. ? Avoid wearing tight pants and underwear or nylons without a cotton panel. ? Avoid wearing thong underwear.  Take off any wet clothing, such as bathing suits, as soon as possible.  Practice safe sex and use condoms. Contact a health care provider if:  You have abdominal pain.  You have a fever.  You have symptoms that last for more than 2-3 days. Get help right away if:  You have a fever and your symptoms suddenly get worse. Summary  Vaginitis is a condition in which the vaginal tissue becomes inflamed.This condition is most often caused by a change in the normal  balance of bacteria and yeast that live in the vagina.  Treatment varies depending on the type of vaginitis you have.  Do not douche, use tampons , or have sex until your health care provider approves. When you can return to sex, practice safe sex and use condoms. This information is not intended to replace advice given to you by your health care provider. Make sure you discuss any questions you have with your health care provider. Document Released: 01/05/2007 Document Revised: 02/20/2017 Document Reviewed: 04/15/2016 Elsevier Patient Education  2020 [employer].

## 2019-02-21 NOTE — Progress Notes (Signed)
GYNECOLOGY ANNUAL PREVENTATIVE CARE ENCOUNTER NOTE  History:     Gabriella Riley is a 43 y.o. [phone] female here for a routine annual gynecologic exam.  Current complaints: itching in external vaginal area for a few days, with some white discharge..   Denies abnormal vaginal bleeding, pelvic pain, problems with intercourse or other gynecologic concerns.    Gynecologic History Patient's last menstrual period was 01/31/2019 (approximate). Contraception: tubal ligation Last Pap: 07/21/2017. Results were: normal with negative HPV Last mammogram: 03/2018. Results were: normal  Obstetric History OB History  Gravida Para Term Preterm AB Living  4 4 4  0 0 4  SAB TAB Ectopic Multiple Live Births  0 0 0 0 4    # Outcome Date GA Lbr Len/2nd Weight Sex Delivery Anes PTL Lv  4 Term 08/22/10 [redacted]w[redacted]d  7 lb 1 oz (3.204 kg) F Vag-Spont   LIV     Complications: Pre-eclampsia  3 Term 05/14/09 [redacted]w[redacted]d  8 lb 3 oz (3.714 kg) M Vag-Spont   LIV     Complications: Pre-eclampsia  2 Term 06/26/00 [redacted]w[redacted]d  6 lb 11 oz (3.033 kg) M Vag-Spont   LIV  1 Term 07/26/95 [redacted]w[redacted]d  7 lb 9 oz (3.43 kg) F Vag-Spont   LIV    Past Medical History:  Diagnosis Date  . Arthritis     Past Surgical History:  Procedure Laterality Date  . BREAST BIOPSY Left 2007  . BREAST EXCISIONAL BIOPSY    . TUBAL LIGATION      Current Outpatient Medications on File Prior to Visit  Medication Sig Dispense Refill  . triamcinolone cream (KENALOG) 0.1 % Apply 1 application topically 2 (two) times daily. (Patient not taking: Reported on 02/21/2019) 30 g 0  . [DISCONTINUED] fluticasone (FLONASE) 50 MCG/ACT nasal spray Place 2 sprays into both nostrils daily. 16 g 1  . [DISCONTINUED] omeprazole (PRILOSEC) 20 MG capsule Take 1 capsule (20 mg total) by mouth daily. 30 capsule 3   No current facility-administered medications on file prior to visit.     Allergies  Allergen Reactions  . Latex Rash    Social History:  reports  that she has never smoked. She has never used smokeless tobacco. She reports current alcohol use. She reports that she does not use drugs.  Family History  Problem Relation Age of Onset  . Hyperlipidemia Father   . Hypertension Father   . Heart failure Father   . Arthritis Maternal Grandmother     The following portions of the patient's history were reviewed and updated as appropriate: allergies, current medications, past family history, past medical history, past social history, past surgical history and problem list.  Review of Systems Pertinent items noted in HPI and remainder of comprehensive ROS otherwise negative.  Physical Exam:  BP (!) 148/90   Pulse 74   Ht 5\' 7"  (1.702 m)   Wt 262 lb (118.8 kg)   LMP 01/31/2019 (Approximate)   BMI 41.04 kg/m  CONSTITUTIONAL: Well-developed, well-nourished female in no acute distress.  HENT:  Normocephalic, atraumatic, External right and left ear normal. Oropharynx is clear and moist EYES: Conjunctivae and EOM are normal. Pupils are equal, round, and reactive to light. No scleral icterus.  NECK: Normal range of motion, supple, no masses.  Normal thyroid.  SKIN: Skin is warm and dry. No rash noted. Not diaphoretic. No erythema. No pallor. MUSCULOSKELETAL: Normal range of motion. No tenderness.  No cyanosis, clubbing, or edema.  2+ distal pulses. NEUROLOGIC: Alert  and oriented to person, place, and time. Normal reflexes, muscle tone coordination.  PSYCHIATRIC: Normal mood and affect. Normal behavior. Normal judgment and thought content. CARDIOVASCULAR: Normal heart rate noted, regular rhythm RESPIRATORY: Clear to auscultation bilaterally. Effort and breath sounds normal, no problems with respiration noted. BREASTS: Symmetric in size. No masses, tenderness, skin changes, nipple drainage, or lymphadenopathy bilaterally. ABDOMEN: Soft, no distention noted.  No tenderness, rebound or guarding.  PELVIC: Normal appearing external genitalia and  urethral meatus; normal appearing vaginal mucosa and cervix.  White, clumpy discharge noted and testing sample obtained.  Pap smear also obtained.  Normal uterine size, no other palpable masses, no uterine or adnexal tenderness.   Assessment and Plan:    1. Well woman exam with routine gynecological exam Will follow up results of pap smear and manage accordingly. - PAP smear  2. Breast cancer screening by mammogram Mammogram scheduled - Mammogram Screening Bilateral Tomo; Future  3. Itching in the vaginal area Likely yeast infection, gave sample of Gynazole.1.   - Cervicovaginal ancillary done, will follow up results and manage accordingly. Vulvar hygiene practices emphasized.  Routine preventative health maintenance measures emphasized. Please refer to After Visit Summary for other counseling recommendations.      Verita Schneiders, MD, [place] for [employer], [place]

## 2019-02-23 LAB — CERVICOVAGINAL ANCILLARY ONLY
Bacterial Vaginitis (gardnerella): POSITIVE — AB
Candida Glabrata: NEGATIVE
Candida Vaginitis: POSITIVE — AB
Chlamydia: NEGATIVE
Comment: NEGATIVE
Comment: NEGATIVE
Comment: NEGATIVE
Comment: NEGATIVE
Comment: NEGATIVE
Comment: NORMAL
Neisseria Gonorrhea: NEGATIVE
Trichomonas: NEGATIVE

## 2019-02-24 MED ORDER — METRONIDAZOLE 500 MG PO TABS: 500.0000 mg | ORAL_TABLET | Freq: Two times a day (BID) | ORAL | 0 refills | Status: DC

## 2019-02-24 MED ORDER — FLUCONAZOLE 150 MG PO TABS: 150.0000 mg | ORAL_TABLET | Freq: Once | ORAL | 3 refills | Status: AC

## 2019-02-24 NOTE — Addendum Note (Signed)
Addended by: Verita Schneiders A on: 02/24/2019 09:37 AM   Modules accepted: Orders

## 2019-02-25 LAB — CYTOLOGY - PAP
Comment: NEGATIVE
Diagnosis: NEGATIVE
High risk HPV: NEGATIVE

## 2019-04-14 ENCOUNTER — Ambulatory Visit
Admission: RE | Admit: 2019-04-14 | Discharge: 2019-04-14 | Disposition: A | Discharge status: Home / Self Care | Payer: BC Managed Care – PPO | Source: Ambulatory Visit | Attending: Obstetrics & Gynecology | Admitting: Obstetrics & Gynecology

## 2019-04-14 ENCOUNTER — Other Ambulatory Visit: Payer: Self-pay | Admitting: Obstetrics & Gynecology

## 2019-04-14 ENCOUNTER — Other Ambulatory Visit: Payer: Self-pay

## 2019-04-14 DIAGNOSIS — Z1231 Encounter for screening mammogram for malignant neoplasm of breast: Secondary | ICD-10-CM

## 2019-05-09 ENCOUNTER — Telehealth: Payer: Self-pay | Admitting: *Deleted

## 2019-05-09 MED ORDER — FLUCONAZOLE 150 MG PO TABS: 150.0000 mg | ORAL_TABLET | Freq: Once | ORAL | 3 refills | Status: AC

## 2019-05-09 NOTE — Telephone Encounter (Signed)
Pt called requesting Diflucan for yeast infection. Informed we will send it in, but if symptoms do not get better we would want her to come in to be evaluated. Pt verbalizes and understands.

## 2019-05-17 ENCOUNTER — Other Ambulatory Visit: Payer: Self-pay

## 2019-05-17 ENCOUNTER — Ambulatory Visit (HOSPITAL_COMMUNITY)
Admission: RE | Admit: 2019-05-17 | Discharge: 2019-05-17 | Disposition: A | Discharge status: Home / Self Care | Payer: BC Managed Care – PPO | Source: Ambulatory Visit | Attending: General Surgery | Admitting: General Surgery

## 2019-06-13 ENCOUNTER — Encounter: Payer: Self-pay | Admitting: Dietician

## 2019-06-13 ENCOUNTER — Other Ambulatory Visit: Payer: Self-pay

## 2019-06-13 ENCOUNTER — Encounter: Payer: BC Managed Care – PPO | Attending: General Surgery | Admitting: Dietician

## 2019-06-13 DIAGNOSIS — E669 Obesity, unspecified: Secondary | ICD-10-CM

## 2019-06-13 NOTE — Progress Notes (Signed)
Nutrition Assessment for Bariatric Surgery Medical Nutrition Therapy   Patient was seen on 06/13/2019 for Pre-Operative Nutrition Assessment. Letter of approval faxed to [facility] bariatric surgery program coordinator on 06/13/2019.   Referral stated Supervised Weight Loss (SWL) visits needed: 0  Planned surgery: Sleeve Gastrectomy Pt expectation of surgery: to relieve joint pain, be able to be more active with family, be healthier, live longer   NUTRITION ASSESSMENT   Anthropometrics  Start weight at [facility]: 261 lbs (date: 06/13/2019) Height: 67 in BMI: 40.9 kg/m2     Lifestyle & Dietary Hx Patient works as an Chiropodist. Knows a friend who has had bariatric surgery. Lives with her family, and her husband shares food shopping/cooking responsibilities.  Typical meal pattern is 2 meals plus 1 snack, or 3 meals per day. Does not avoid any particular foods/food groups. May eat out throughout the week. Meals may include [employer], [employer], spaghetti, pizza, hamburger with fries, chicken wings, or chicken with corn, rice, and green beans. May have a protein shake or kiwi for breakfast if she doesn't skip it.  Currently is not physically active during the week but would like to start working on this. States her weight gain started after pregnancies. Previous attempted weight loss methods include Weight Watchers, diets, and phentermine.    24-Hr Dietary Recall First Meal: protein shake   Snack: handful unsalted peanuts  Second Meal: [employer]  Snack: -  Third Meal: beef tips + rice  Snack: - Beverages: water, Cherry Coke (2x/day)    NUTRITION DIAGNOSIS  Overweight/obesity ([place]-3.3) related to past poor dietary habits and physical inactivity as evidenced by patient w/ planned Sleeve Gastrectomy surgery following dietary guidelines for continued weight loss.    NUTRITION INTERVENTION  Nutrition counseling (C-1) and education (E-2) to facilitate bariatric  surgery goals.  Pre-Op Goals Reviewed with the Patient . Track food and beverage intake (pen and paper, MyFitness Pal, Baritastic app, etc.) . Make healthy food choices while monitoring portion sizes . Consume 3 meals per day or try to eat every 3-5 hours . Avoid concentrated sugars and fried foods . Keep sugar & fat in the single digits per serving on food labels . Practice CHEWING your food (aim for applesauce consistency) . Practice not drinking 15 minutes before, during, and 30 minutes after each meal and snack . Avoid all carbonated beverages (ex: soda, sparkling beverages)  . Limit caffeinated beverages (ex: coffee, tea, energy drinks) . Avoid all sugar-sweetened beverages (ex: regular soda, sports drinks)  . Avoid alcohol  . Aim for 64-100 ounces of FLUID daily (with at least half of fluid intake being plain water)  . Aim for at least 60-80 grams of PROTEIN daily . Look for a liquid protein source that contains ?15 g protein and ?5 g carbohydrate (ex: shakes, drinks, shots) . Make a list of non-food related activities . Physical activity is an important part of a healthy lifestyle so keep it moving! The goal is to reach 150 minutes of exercise per week, including cardiovascular and weight baring activity.  *Goals that are bolded indicate the pt would like to start working towards these  Handouts Provided Include  . Bariatric Surgery handouts (Nutrition Visits, Pre-Op Goals, Protein Shakes, Vitamins & Minerals)  Learning Style & Readiness for Change Teaching method utilized: Visual & Auditory  Demonstrated degree of understanding via: Teach Back  Barriers to learning/adherence to lifestyle change: None Identified    MONITORING & EVALUATION Dietary intake, weekly physical activity, body weight, and  pre-op goals reached at next nutrition visit.   Next Steps Patient is to follow up at [facility] for Pre-Op Class (>2 weeks before surgery) for further nutrition education.

## 2019-07-04 ENCOUNTER — Other Ambulatory Visit: Payer: Self-pay

## 2019-07-04 ENCOUNTER — Encounter: Payer: BC Managed Care – PPO | Attending: General Surgery | Admitting: Skilled Nursing Facility1

## 2019-07-04 DIAGNOSIS — E669 Obesity, unspecified: Secondary | ICD-10-CM | POA: Diagnosis not present

## 2019-07-04 NOTE — Progress Notes (Signed)
Pre-Operative Nutrition Class:  Appt start time: 3329   End time:  1830.  Patient was seen on 07/04/2019 for Pre-Operative Bariatric Surgery Education [facility]    Surgery date:  Surgery type: sleeve Start weight at [facility]: 261 Weight today: 267.2  Samples given per MNT protocol. Patient educated on appropriate usage: Bariatric Advantage Multivitamin Lot #[id number] Exp:08/21  Bariatric Advantage Calcium  Lot #[id number] Exp:03/26/2020  Protein Shake Lot #[id number] Exp:08/08/2020  The following the learning objectives were met by the patient during this course:  Identify Pre-Op Dietary Goals and will begin 2 weeks pre-operatively  Identify appropriate sources of fluids and proteins   State protein recommendations and appropriate sources pre and post-operatively  Identify Post-Operative Dietary Goals and will follow for 2 weeks post-operatively  Identify appropriate multivitamin and calcium sources  Describe the need for physical activity post-operatively and will follow MD recommendations  State when to call healthcare provider regarding medication questions or post-operative complications  Handouts given during class include:  Pre-Op Bariatric Surgery Diet Handout  Protein Shake Handout  Post-Op Bariatric Surgery Nutrition Handout  [facility] Program Information Flyer  Support Group Information Flyer  [facility] Bariatric Supplements Price List  Follow-Up Plan: Patient will follow-up at [facility] 2 weeks post operatively for diet advancement per MD.

## 2019-07-08 ENCOUNTER — Encounter: Payer: Self-pay | Admitting: Family Medicine

## 2019-07-08 ENCOUNTER — Ambulatory Visit: Payer: BC Managed Care – PPO | Admitting: Family Medicine

## 2019-07-08 ENCOUNTER — Other Ambulatory Visit: Payer: Self-pay

## 2019-07-08 ENCOUNTER — Telehealth: Payer: Self-pay

## 2019-07-08 VITALS — BP 140/94 | HR 68 | Temp 98.0°F | Ht 67.0 in | Wt 267.2 lb

## 2019-07-08 DIAGNOSIS — R519 Headache, unspecified: Secondary | ICD-10-CM | POA: Diagnosis not present

## 2019-07-08 DIAGNOSIS — R030 Elevated blood-pressure reading, without diagnosis of hypertension: Secondary | ICD-10-CM | POA: Diagnosis not present

## 2019-07-08 NOTE — Progress Notes (Signed)
Chief Complaint  Patient presents with  . Dizziness    on and off x 4 weeks  . Blurred Vision  . Headache    on right side of head    History of Present Illness: HPI  45 year old female pt of Gabriella Riley's  presents with new  onset  headache off and on for 4 weeks.   She has also noted headache on right side of head in last  4 weeks as well.. occurring first thing in AM and improved after 30 min.  Increasing in intensity and occurring in evening as well. 7-8/10 on pain scale.  Pain mainly in right temple, throbbing pain. Sensitive to sound.  Associated with no.. no emesis. Associated with dizziness at time.  Tylenol helps some.  No new numbness or weakness. No slurred speech, no confusion.  She has noted blurred vision during the headache.   No family history of migraines.. father with HTN.  Some increase in fatigue lately.  BP Readings from Last 3 Encounters:  07/08/19 (!) 140/94  02/21/19 (!) 148/90  01/27/19 136/84     This visit occurred during the SARS-CoV-2 public health emergency.  Safety protocols were in place, including screening questions prior to the visit, additional usage of staff PPE, and extensive cleaning of exam room while observing appropriate contact time as indicated for disinfecting solutions.   COVID 19 screen:  No recent travel or known exposure to COVID19 The patient denies respiratory symptoms of COVID 19 at this time. The importance of social distancing was discussed today.     Review of Systems  Constitutional: Negative for chills and fever.  HENT: Negative for congestion and ear pain.   Eyes: Negative for pain and redness.  Respiratory: Negative for cough and shortness of breath.   Cardiovascular: Negative for chest pain, palpitations and leg swelling.  Gastrointestinal: Negative for abdominal pain, blood in stool, constipation, diarrhea, nausea and vomiting.  Genitourinary: Negative for dysuria.  Musculoskeletal: Negative for falls  and myalgias.  Skin: Negative for rash.  Neurological: Positive for dizziness and headaches.  Psychiatric/Behavioral: Negative for depression. The patient is not nervous/anxious.       Past Medical History:  Diagnosis Date  . Arthritis   . GERD (gastroesophageal reflux disease)   . Hyperlipidemia   . Hypertension     reports that she has never smoked. She has never used smokeless tobacco. She reports current alcohol use. She reports that she does not use drugs.   Current Outpatient Medications:  .  triamcinolone cream (KENALOG) 0.1 %, Apply 1 application topically 2 (two) times daily., Disp: 30 g, Rfl: 0   Observations/Objective: Blood pressure (!) 140/94, pulse 68, temperature 98 F (36.7 C), temperature source Temporal, height 5\' 7"  (1.702 m), weight 267 lb 4 oz (121.2 kg), last menstrual period 06/27/2019, SpO2 99 %.  Physical Exam Constitutional:      General: She is not in acute distress.    Appearance: Normal appearance. She is well-developed. She is not ill-appearing or toxic-appearing.  HENT:     Head: Normocephalic.     Right Ear: Hearing, tympanic membrane, ear canal and external ear normal. Tympanic membrane is not erythematous, retracted or bulging.     Left Ear: Hearing, tympanic membrane, ear canal and external ear normal. Tympanic membrane is not erythematous, retracted or bulging.     Nose: No mucosal edema or rhinorrhea.     Right Sinus: No maxillary sinus tenderness or frontal sinus tenderness.  Left Sinus: No maxillary sinus tenderness or frontal sinus tenderness.     Mouth/Throat:     Pharynx: Uvula midline.  Eyes:     General: Lids are normal. Lids are everted, no foreign bodies appreciated.     Conjunctiva/sclera: Conjunctivae normal.     Pupils: Pupils are equal, round, and reactive to light.  Neck:     Thyroid: No thyroid mass or thyromegaly.     Vascular: No carotid bruit.     Trachea: Trachea normal.  Cardiovascular:     Rate and Rhythm: Normal  rate and regular rhythm.     Pulses: Normal pulses.     Heart sounds: Normal heart sounds, S1 normal and S2 normal. No murmur. No friction rub. No gallop.   Pulmonary:     Effort: Pulmonary effort is normal. No tachypnea or respiratory distress.     Breath sounds: Normal breath sounds. No decreased breath sounds, wheezing, rhonchi or rales.  Abdominal:     General: Bowel sounds are normal.     Palpations: Abdomen is soft.     Tenderness: There is no abdominal tenderness.  Musculoskeletal:     Cervical back: Normal range of motion and neck supple.  Skin:    General: Skin is warm and dry.     Findings: No rash.  Neurological:     Mental Status: She is alert and oriented to person, place, and time.     GCS: GCS eye subscore is 4. GCS verbal subscore is 5. GCS motor subscore is 6.     Cranial Nerves: No cranial nerve deficit.     Sensory: No sensory deficit.     Motor: No abnormal muscle tone.     Coordination: Coordination normal.     Gait: Gait normal.     Deep Tendon Reflexes: Reflexes are normal and symmetric.     Comments: Nml cerebellar exam   No papilledema  Psychiatric:        Mood and Affect: Mood is not anxious or depressed.        Speech: Speech normal.        Behavior: Behavior normal. Behavior is cooperative.        Thought Content: Thought content normal.        Cognition and Memory: Memory is not impaired. She does not exhibit impaired recent memory or impaired remote memory.        Judgment: Judgment normal.      Assessment and Plan      Gabriella Lofts, MD

## 2019-07-08 NOTE — Telephone Encounter (Signed)
Noted  

## 2019-07-08 NOTE — Patient Instructions (Addendum)
Start BP monitoring daily at home.Marland Kitchen goal BP < 140/90. Call with results in next few weeks.  Please stop at the lab to have labs drawn.  Keep up with water intake.  Start headache diary.  Can ibuprofen 800 mg  three times a day as needed on full stomach.

## 2019-07-08 NOTE — Telephone Encounter (Signed)
Pt said for 4 wks pt has had on and off; headache on rt side of head(pain level 5.) in the mornings is sharp pain that last 20 - 30' and in evening H/A is dull and last at least 1 hr and Tylenol does not really help. Lightheaded and blurred vision on and off. Nausea but no vomiting. Pt does not have h/a, blurred vision or lightheadedness now. Pt is at work; offered multiple appts but pt could not come until 3:20 this afternoon with Dr Diona Browner. UC & ED precautions given and pt voiced understanding.

## 2019-07-09 LAB — CBC WITH DIFFERENTIAL/PLATELET
Absolute Monocytes: 655 cells/uL (ref 200–950)
Basophils Absolute: 56 cells/uL (ref 0–200)
Basophils Relative: 0.5 %
Eosinophils Absolute: 289 cells/uL (ref 15–500)
Eosinophils Relative: 2.6 %
HCT: 36.3 % (ref 35.0–45.0)
Hemoglobin: 10.6 g/dL — ABNORMAL LOW (ref 11.7–15.5)
Lymphs Abs: 2930 cells/uL (ref 850–3900)
MCH: 20.3 pg — ABNORMAL LOW (ref 27.0–33.0)
MCHC: 29.2 g/dL — ABNORMAL LOW (ref 32.0–36.0)
MCV: 69.4 fL — ABNORMAL LOW (ref 80.0–100.0)
MPV: 11.9 fL (ref 7.5–12.5)
Monocytes Relative: 5.9 %
Neutro Abs: 7171 cells/uL (ref 1500–7800)
Neutrophils Relative %: 64.6 %
Platelets: 293 10*3/uL (ref 140–400)
RBC: 5.23 10*6/uL — ABNORMAL HIGH (ref 3.80–5.10)
RDW: 15.9 % — ABNORMAL HIGH (ref 11.0–15.0)
Total Lymphocyte: 26.4 %
WBC: 11.1 10*3/uL — ABNORMAL HIGH (ref 3.8–10.8)

## 2019-07-09 LAB — COMPREHENSIVE METABOLIC PANEL
AG Ratio: 1.3 (calc) (ref 1.0–2.5)
ALT: 12 U/L (ref 6–29)
AST: 15 U/L (ref 10–30)
Albumin: 3.8 g/dL (ref 3.6–5.1)
Alkaline phosphatase (APISO): 85 U/L (ref 31–125)
BUN: 12 mg/dL (ref 7–25)
CO2: 24 mmol/L (ref 20–32)
Calcium: 9.5 mg/dL (ref 8.6–10.2)
Chloride: 105 mmol/L (ref 98–110)
Creat: 0.9 mg/dL (ref 0.50–1.10)
Globulin: 3 g/dL (calc) (ref 1.9–3.7)
Glucose, Bld: 84 mg/dL (ref 65–99)
Potassium: 4.3 mmol/L (ref 3.5–5.3)
Sodium: 140 mmol/L (ref 135–146)
Total Bilirubin: 0.3 mg/dL (ref 0.2–1.2)
Total Protein: 6.8 g/dL (ref 6.1–8.1)

## 2019-07-09 LAB — TSH: TSH: 1.31 mIU/L

## 2019-08-12 DIAGNOSIS — R519 Headache, unspecified: Secondary | ICD-10-CM | POA: Insufficient documentation

## 2019-08-12 DIAGNOSIS — R030 Elevated blood-pressure reading, without diagnosis of hypertension: Secondary | ICD-10-CM | POA: Insufficient documentation

## 2019-08-12 NOTE — Assessment & Plan Note (Signed)
Likely related to elevated blood pressure.  evaluate with labs for secondary cause.  Neuro exam today in nml limits.   Keep up with water intake.  Start headache diary.  Can ibuprofen 800 mg  three times a day as needed on full stomach.

## 2019-08-12 NOTE — Assessment & Plan Note (Signed)
Follow BP at home. Goal < 140/90.

## 2019-10-04 ENCOUNTER — Other Ambulatory Visit: Payer: Self-pay

## 2019-10-04 ENCOUNTER — Encounter: Payer: Self-pay | Admitting: Internal Medicine

## 2019-10-04 ENCOUNTER — Ambulatory Visit (INDEPENDENT_AMBULATORY_CARE_PROVIDER_SITE_OTHER): Payer: BC Managed Care – PPO | Admitting: Internal Medicine

## 2019-10-04 VITALS — BP 142/96 | Temp 97.7°F | Wt 267.0 lb

## 2019-10-04 DIAGNOSIS — R519 Headache, unspecified: Secondary | ICD-10-CM

## 2019-10-04 DIAGNOSIS — I10 Essential (primary) hypertension: Secondary | ICD-10-CM | POA: Insufficient documentation

## 2019-10-04 MED ORDER — LOSARTAN POTASSIUM 25 MG PO TABS: 25.0000 mg | ORAL_TABLET | Freq: Every day | ORAL | 0 refills | Status: DC

## 2019-10-04 NOTE — Patient Instructions (Signed)

## 2019-10-04 NOTE — Progress Notes (Signed)
Subjective:    Patient ID: Gabriella Riley, female    DOB: 02/14/1976, 44 y.o.   MRN: [id number]  HPI  Pt presents to the clinic today wit hc/o persistent headaches. This started a few months ago. She reports the headache is lcoated in the right posterior aspect of her head. She describes the pain as throbbing and pressure. The pain is worse in the morning and gets better throughout the day. She reports associated dizziness, nausea and feeling off balanced. She denies visual changes, other than she saw "spots" one time. She denies eye pain, eye redness or discharge, runny nose, nasal congestion, ear pain or sore throat. She denies vomiting. She does have some right sided neck pain. She denies numbness, tingling or weakness in the right upper extremity.  She has tried Tylenol OTC with minimal relief of symptoms. Her BP today is 142/96. She feels like her stress levels are manageable. She has not had an eye exam recently. She denies family history of brain aneurysm or tumor.  Review of Systems      Past Medical History:  Diagnosis Date  . Arthritis   . GERD (gastroesophageal reflux disease)   . Hyperlipidemia   . Hypertension     Current Outpatient Medications  Medication Sig Dispense Refill  . triamcinolone cream (KENALOG) 0.1 % Apply 1 application topically 2 (two) times daily. 30 g 0   No current facility-administered medications for this visit.    Allergies  Allergen Reactions  . Latex Rash    Family History  Problem Relation Age of Onset  . Hyperlipidemia Father   . Hypertension Father   . Heart failure Father   . Arthritis Maternal Grandmother     Social History   Socioeconomic History  . Marital status: Divorced    Spouse name: Not on file  . Number of children: Not on file  . Years of education: Not on file  . Highest education level: Not on file  Occupational History  . Not on file  Tobacco Use  . Smoking status: Never Smoker  . Smokeless  tobacco: Never Used  Substance and Sexual Activity  . Alcohol use: Yes    Alcohol/week: 0.0 standard drinks    Comment: social  . Drug use: No  . Sexual activity: Yes    Birth control/protection: None, Surgical    Comment: tubal ligation   Other Topics Concern  . Not on file  Social History Narrative  . Not on file   Social Determinants of Health   Financial Resource Strain:   . Difficulty of Paying Living Expenses:   Food Insecurity:   . Worried About Charity fundraiser in the Last Year:   . Arboriculturist in the Last Year:   Transportation Needs:   . Film/video editor (Medical):   Marland Kitchen Lack of Transportation (Non-Medical):   Physical Activity:   . Days of Exercise per Week:   . Minutes of Exercise per Session:   Stress:   . Feeling of Stress :   Social Connections:   . Frequency of Communication with Friends and Family:   . Frequency of Social Gatherings with Friends and Family:   . Attends Religious Services:   . Active Member of Clubs or Organizations:   . Attends Archivist Meetings:   Marland Kitchen Marital Status:   Intimate Partner Violence:   . Fear of Current or Ex-Partner:   . Emotionally Abused:   Marland Kitchen Physically Abused:   .  Sexually Abused:      Constitutional: Pt reports headaches. Denies fever, malaise, fatigue, or abrupt weight changes.  HEENT: Denies eye pain, eye redness, ear pain, ringing in the ears, wax buildup, runny nose, nasal congestion, bloody nose, or sore throat. Respiratory: Denies difficulty breathing, shortness of breath, cough or sputum production.   Cardiovascular: Denies chest pain, chest tightness, palpitations or swelling in the hands or feet.  Musculoskeletal: Pt reports right side neck pain. Denies decrease in range of motion, difficulty with gait, or joint swelling.  Skin: Denies redness, rashes, lesions or ulcercations.  Neurological: Pt report dizziness, difficulty with balance.Denies difficulty with memory, difficulty with  speech or problems with coordination.    No other specific complaints in a complete review of systems (except as listed in HPI above).  Objective:   Physical Exam  BP (!) 142/96   Temp 97.7 F (36.5 C) (Temporal)   Wt 267 lb (121.1 kg)   LMP 10/03/2019   BMI 41.82 kg/m   Wt Readings from Last 3 Encounters:  07/08/19 267 lb 4 oz (121.2 kg)  07/04/19 267 lb 3.2 oz (121.2 kg)  06/13/19 261 lb (118.4 kg)    General: Appears her stated age, obese, in NAD. Skin: Warm, dry and intact. No rashes noted. HEENT: Head: normal shape and size; Eyes: sclera white, no icterus, conjunctiva pink, PERRLA and EOMs intact;  Cardiovascular: Normal rate and rhythm. S1,S2 noted.  No murmur, rubs or gallops noted.  Pulmonary/Chest: Normal effort and positive vesicular breath sounds. No respiratory distress. No wheezes, rales or ronchi noted.  Musculoskeletal: Normal flexion, extension and rotation of the cervical spine. No bony tenderness noted over the cervical spine. Pain with palpation over the right trapezius. Strength 5/5 BUE. Hand grips equal. Normal gait, but difficulty with heel toe walking.  Neurological: Alert and oriented. Cranial nerves II-XII grossly intact. Coordination normal.    BMET    Component Value Date/Time   NA 140 07/08/2019 1633   NA 138 09/28/2017 1005   K 4.3 07/08/2019 1633   CL 105 07/08/2019 1633   CO2 24 07/08/2019 1633   GLUCOSE 84 07/08/2019 1633   BUN 12 07/08/2019 1633   BUN 12 09/28/2017 1005   CREATININE 0.90 07/08/2019 1633   CALCIUM 9.5 07/08/2019 1633   GFRNONAA 80 09/28/2017 1005   GFRAA 92 09/28/2017 1005    Lipid Panel     Component Value Date/Time   CHOL 185 09/28/2017 1005   TRIG 66 09/28/2017 1005   HDL 59 09/28/2017 1005   CHOLHDL 3.1 09/28/2017 1005   LDLCALC 113 (H) 09/28/2017 1005    CBC    Component Value Date/Time   WBC 11.1 (H) 07/08/2019 1633   RBC 5.23 (H) 07/08/2019 1633   HGB 10.6 (L) 07/08/2019 1633   HGB 10.4 (L)  09/28/2017 1005   HCT 36.3 07/08/2019 1633   HCT 34.0 09/28/2017 1005   PLT 293 07/08/2019 1633   PLT 294 09/28/2017 1005   MCV 69.4 (L) 07/08/2019 1633   MCV 68 (L) 09/28/2017 1005   MCH 20.3 (L) 07/08/2019 1633   MCHC 29.2 (L) 07/08/2019 1633   RDW 15.9 (H) 07/08/2019 1633   RDW 18.1 (H) 09/28/2017 1005   LYMPHSABS 2,930 07/08/2019 1633   EOSABS 289 07/08/2019 1633   BASOSABS 56 07/08/2019 1633    Hgb A1C Lab Results  Component Value Date   HGBA1C 5.6 05/25/2018            Assessment & Plan:  Acute Headache, HTN:  I really feel this is related to HTN Will trial Losartan 25 mg PO daily If no improvement, consider xray cervical spine and MRI brain  RTC in 10 days for BP check Webb Silversmith, NP  This visit occurred during the SARS-CoV-2 public health emergency.  Safety protocols were in place, including screening questions prior to the visit, additional usage of staff PPE, and extensive cleaning of exam room while observing appropriate contact time as indicated for disinfecting solutions.

## 2019-10-10 ENCOUNTER — Ambulatory Visit: Payer: Self-pay | Admitting: General Surgery

## 2019-10-14 ENCOUNTER — Ambulatory Visit: Payer: BC Managed Care – PPO | Admitting: Internal Medicine

## 2019-10-18 ENCOUNTER — Other Ambulatory Visit: Payer: Self-pay

## 2019-10-18 ENCOUNTER — Encounter: Payer: Self-pay | Admitting: Internal Medicine

## 2019-10-18 ENCOUNTER — Ambulatory Visit: Payer: BC Managed Care – PPO | Admitting: Internal Medicine

## 2019-10-18 VITALS — BP 124/82 | HR 67 | Temp 97.4°F | Ht 67.0 in | Wt 266.0 lb

## 2019-10-18 DIAGNOSIS — I10 Essential (primary) hypertension: Secondary | ICD-10-CM

## 2019-10-18 DIAGNOSIS — R519 Headache, unspecified: Secondary | ICD-10-CM | POA: Diagnosis not present

## 2019-10-18 MED ORDER — LOSARTAN POTASSIUM 25 MG PO TABS: 25.0000 mg | ORAL_TABLET | Freq: Every day | ORAL | 1 refills | Status: DC

## 2019-10-18 NOTE — Patient Instructions (Signed)

## 2019-10-18 NOTE — Progress Notes (Signed)
Subjective:    Patient ID: Gabriella Riley, female    DOB: 11-13-1975, 44 y.o.   MRN: [id number]  HPI  Patient presents the clinic today for follow-up of hypertension.  At her last visit she was started on Losartan 25 mg daily for elevated blood pressure and frequent headaches.  She has been taking the medication as prescribed.  She denies adverse side effects but has had an intermittent dry cough. She reports her headaches have improved some.  Her BP today is 124/82.  ECG from 04/2019 reviewed.  Review of Systems      Past Medical History:  Diagnosis Date  . Arthritis   . GERD (gastroesophageal reflux disease)   . Hyperlipidemia   . Hypertension     Current Outpatient Medications  Medication Sig Dispense Refill  . acetaminophen (TYLENOL) 500 MG tablet Take 500-1,000 mg by mouth every 6 (six) hours as needed (for pain.).    Marland Kitchen losartan (COZAAR) 25 MG tablet Take 1 tablet (25 mg total) by mouth daily. 30 tablet 0  . Multiple Vitamins-Minerals (ADULT GUMMY PO) Take 2 tablets by mouth daily.    Marland Kitchen triamcinolone cream (KENALOG) 0.1 % Apply 1 application topically 2 (two) times daily. 30 g 0   No current facility-administered medications for this visit.    Allergies  Allergen Reactions  . Latex Rash    Family History  Problem Relation Age of Onset  . Hyperlipidemia Father   . Hypertension Father   . Heart failure Father   . Arthritis Maternal Grandmother     Social History   Socioeconomic History  . Marital status: Divorced    Spouse name: Not on file  . Number of children: Not on file  . Years of education: Not on file  . Highest education level: Not on file  Occupational History  . Not on file  Tobacco Use  . Smoking status: Never Smoker  . Smokeless tobacco: Never Used  Substance and Sexual Activity  . Alcohol use: Yes    Alcohol/week: 0.0 standard drinks    Comment: social  . Drug use: No  . Sexual activity: Yes    Birth control/protection: None,  Surgical    Comment: tubal ligation   Other Topics Concern  . Not on file  Social History Narrative  . Not on file   Social Determinants of Health   Financial Resource Strain:   . Difficulty of Paying Living Expenses:   Food Insecurity:   . Worried About Charity fundraiser in the Last Year:   . Arboriculturist in the Last Year:   Transportation Needs:   . Film/video editor (Medical):   Marland Kitchen Lack of Transportation (Non-Medical):   Physical Activity:   . Days of Exercise per Week:   . Minutes of Exercise per Session:   Stress:   . Feeling of Stress :   Social Connections:   . Frequency of Communication with Friends and Family:   . Frequency of Social Gatherings with Friends and Family:   . Attends Religious Services:   . Active Member of Clubs or Organizations:   . Attends Archivist Meetings:   Marland Kitchen Marital Status:   Intimate Partner Violence:   . Fear of Current or Ex-Partner:   . Emotionally Abused:   Marland Kitchen Physically Abused:   . Sexually Abused:      Constitutional: Pt reports frequent headaches. Denies fever, malaise, fatigue, or abrupt weight changes.  Respiratory: Pt reports intermittent  dry cough, not concerning to her. Denies difficulty breathing, shortness of breath, or sputum production.   Cardiovascular: Denies chest pain, chest tightness, palpitations or swelling in the hands or feet.  Neurological: Denies dizziness, difficulty with memory, difficulty with speech or problems with balance and coordination.    No other specific complaints in a complete review of systems (except as listed in HPI above).  Objective:   Physical Exam  BP 124/82   Pulse 67   Temp (!) 97.4 F (36.3 C) (Temporal)   Ht _0  (1.702 m)   Wt (!) 266 lb (120.7 kg)   LMP 10/03/2019   SpO2 97%   BMI 41.66 kg/m   Wt Readings from Last 3 Encounters:  10/04/19 267 lb (121.1 kg)  07/08/19 267 lb 4 oz (121.2 kg)  07/04/19 267 lb 3.2 oz (121.2 kg)    General: Appears her  stated age, obese, in NAD. Cardiovascular: Normal rate and rhythm. S1,S2 noted.  No murmur, rubs or gallops noted.  Pulmonary/Chest: Normal effort and positive vesicular breath sounds. No respiratory distress. No wheezes, rales or ronchi noted.  Neurological: Alert and oriented.    BMET    Component Value Date/Time   NA 140 07/08/2019 1633   NA 138 09/28/2017 1005   K 4.3 07/08/2019 1633   CL 105 07/08/2019 1633   CO2 24 07/08/2019 1633   GLUCOSE 84 07/08/2019 1633   BUN 12 07/08/2019 1633   BUN 12 09/28/2017 1005   CREATININE 0.90 07/08/2019 1633   CALCIUM 9.5 07/08/2019 1633   GFRNONAA 80 09/28/2017 1005   GFRAA 92 09/28/2017 1005    Lipid Panel     Component Value Date/Time   CHOL 185 09/28/2017 1005   TRIG 66 09/28/2017 1005   HDL 59 09/28/2017 1005   CHOLHDL 3.1 09/28/2017 1005   LDLCALC 113 (H) 09/28/2017 1005    CBC    Component Value Date/Time   WBC 11.1 (H) 07/08/2019 1633   RBC 5.23 (H) 07/08/2019 1633   HGB 10.6 (L) 07/08/2019 1633   HGB 10.4 (L) 09/28/2017 1005   HCT 36.3 07/08/2019 1633   HCT 34.0 09/28/2017 1005   PLT 293 07/08/2019 1633   PLT 294 09/28/2017 1005   MCV 69.4 (L) 07/08/2019 1633   MCV 68 (L) 09/28/2017 1005   MCH 20.3 (L) 07/08/2019 1633   MCHC 29.2 (L) 07/08/2019 1633   RDW 15.9 (H) 07/08/2019 1633   RDW 18.1 (H) 09/28/2017 1005   LYMPHSABS 2,930 07/08/2019 1633   EOSABS 289 07/08/2019 1633   BASOSABS 56 07/08/2019 1633    Hgb A1C Lab Results  Component Value Date   HGBA1C 5.6 05/25/2018           Assessment & Plan:   HTN, Frequent Headaches:  Headaches improved with treatment of HTN, so we will hold off on imaging of the cervical spine and brain at this time Continue losartan, refilled today Reinforced DASH diet and exercise for weight loss She will have preop blood work soon, will review C met at that time  RTC in 6 months for annual exam Webb Silversmith, NP This visit occurred during the SARS-CoV-2 public  health emergency.  Safety protocols were in place, including screening questions prior to the visit, additional usage of staff PPE, and extensive cleaning of exam room while observing appropriate contact time as indicated for disinfecting solutions.

## 2019-10-20 ENCOUNTER — Telehealth: Payer: Self-pay | Admitting: Skilled Nursing Facility1

## 2019-10-20 NOTE — Telephone Encounter (Signed)
Dietitian called pt to assess their understanding of the pre-op nutrition recommendations through the teach back method to ensure the pts knowledge readiness in preparation for surgery.   Pt states she is prepared with no questions.

## 2019-10-25 ENCOUNTER — Other Ambulatory Visit (HOSPITAL_COMMUNITY)
Admission: RE | Admit: 2019-10-25 | Discharge: 2019-10-25 | Disposition: A | Discharge status: Home / Self Care | Payer: BC Managed Care – PPO | Source: Ambulatory Visit | Attending: Obstetrics and Gynecology | Admitting: Obstetrics and Gynecology

## 2019-10-25 ENCOUNTER — Ambulatory Visit (INDEPENDENT_AMBULATORY_CARE_PROVIDER_SITE_OTHER): Payer: BC Managed Care – PPO | Admitting: *Deleted

## 2019-10-25 ENCOUNTER — Other Ambulatory Visit: Payer: Self-pay

## 2019-10-25 VITALS — BP 137/93 | HR 77

## 2019-10-25 DIAGNOSIS — N898 Other specified noninflammatory disorders of vagina: Secondary | ICD-10-CM | POA: Insufficient documentation

## 2019-10-25 NOTE — Progress Notes (Signed)
SUBJECTIVE:  44 y.o. female complains of vaginal irritation after using bath and body works soap. Denies abnormal vaginal bleeding or significant pelvic pain or fever. No UTI symptoms. Denies history of known exposure to STD.  Patient's last menstrual period was 10/03/2019.  OBJECTIVE:  She appears well, afebrile. Urine dipstick: not done.  ASSESSMENT:  Vaginal Irritation    PLAN:  GC, chlamydia, trichomonas, BVAG, CVAG probe sent to lab. Treatment: To be determined once lab results are received ROV prn if symptoms persist or worsen.

## 2019-10-26 ENCOUNTER — Encounter (HOSPITAL_COMMUNITY): Admission: RE | Admit: 2019-10-26 | Payer: BC Managed Care – PPO | Source: Ambulatory Visit

## 2019-10-26 ENCOUNTER — Other Ambulatory Visit: Payer: Self-pay

## 2019-10-26 LAB — CERVICOVAGINAL ANCILLARY ONLY
Bacterial Vaginitis (gardnerella): NEGATIVE
Candida Glabrata: NEGATIVE
Candida Vaginitis: POSITIVE — AB
Chlamydia: NEGATIVE
Comment: NEGATIVE
Comment: NEGATIVE
Comment: NEGATIVE
Comment: NEGATIVE
Comment: NEGATIVE
Comment: NORMAL
Neisseria Gonorrhea: NEGATIVE
Trichomonas: NEGATIVE

## 2019-10-26 MED ORDER — FLUCONAZOLE 150 MG PO TABS
150.0000 mg | ORAL_TABLET | Freq: Once | ORAL | 0 refills | Status: AC
Start: 2019-10-26 — End: 2019-10-26

## 2019-10-26 NOTE — Progress Notes (Signed)
Patient was assessed and managed by nursing staff during this encounter. I have reviewed the chart and agree with the documentation and plan. I have also made any necessary editorial changes.  Aletha Halim, MD 10/26/2019 8:32 AM

## 2019-10-26 NOTE — Telephone Encounter (Signed)
Patient lab results showed yeast infection. Patient attempted to use monistat 7 with no relief. She would like a diflucan called into [facility].

## 2019-11-03 ENCOUNTER — Other Ambulatory Visit (HOSPITAL_COMMUNITY): Payer: BC Managed Care – PPO

## 2019-11-07 ENCOUNTER — Inpatient Hospital Stay: Admit: 2019-11-07 | Payer: BC Managed Care – PPO | Admitting: General Surgery

## 2019-11-07 SURGERY — GASTRECTOMY, SLEEVE, LAPAROSCOPIC
Anesthesia: General

## 2019-11-22 ENCOUNTER — Ambulatory Visit: Payer: BC Managed Care – PPO

## 2019-12-05 ENCOUNTER — Encounter: Payer: Self-pay | Admitting: Radiology

## 2019-12-15 ENCOUNTER — Other Ambulatory Visit: Payer: Self-pay

## 2019-12-15 ENCOUNTER — Encounter: Payer: Self-pay | Admitting: Internal Medicine

## 2019-12-15 ENCOUNTER — Ambulatory Visit (INDEPENDENT_AMBULATORY_CARE_PROVIDER_SITE_OTHER): Payer: BC Managed Care – PPO | Admitting: Internal Medicine

## 2019-12-15 VITALS — BP 136/86 | HR 71 | Temp 98.3°F | Wt 273.0 lb

## 2019-12-15 DIAGNOSIS — N920 Excessive and frequent menstruation with regular cycle: Secondary | ICD-10-CM

## 2019-12-15 NOTE — Progress Notes (Signed)
Subjective:    Patient ID: Gabriella Riley, female    DOB: August 28, 1975, 44 y.o.   MRN: [id number]  HPI    Pt presents to the clinic today with c/o heavy menstrual periods.This has been an ongoing issues for a few years. She reports heavy bleeding with clots, mood swings, irritability and nights sweats. She reports she continues to have regular periods. She had a pelvic/transvaginal ultrasound,  01/2018 for the same, which showed a thickened endometrial stripe. Her last Pap smear was normal, 01/2019. She sees [facility] for her GYN care.   Review of Systems      Past Medical History:  Diagnosis Date  . Arthritis   . GERD (gastroesophageal reflux disease)   . Hyperlipidemia   . Hypertension     Current Outpatient Medications  Medication Sig Dispense Refill  . acetaminophen (TYLENOL) 500 MG tablet Take 500-1,000 mg by mouth every 6 (six) hours as needed (for pain.).    Marland Kitchen losartan (COZAAR) 25 MG tablet Take 1 tablet (25 mg total) by mouth daily. 90 tablet 1  . Multiple Vitamins-Minerals (ADULT GUMMY PO) Take 2 tablets by mouth daily.    Marland Kitchen triamcinolone cream (KENALOG) 0.1 % Apply 1 application topically 2 (two) times daily. 30 g 0   No current facility-administered medications for this visit.    Allergies  Allergen Reactions  . Latex Rash    Family History  Problem Relation Age of Onset  . Hyperlipidemia Father   . Hypertension Father   . Heart failure Father   . Arthritis Maternal Grandmother     Social History   Socioeconomic History  . Marital status: Divorced    Spouse name: Not on file  . Number of children: Not on file  . Years of education: Not on file  . Highest education level: Not on file  Occupational History  . Not on file  Tobacco Use  . Smoking status: Never Smoker  . Smokeless tobacco: Never Used  Substance and Sexual Activity  . Alcohol use: Yes    Alcohol/week: 0.0 standard drinks    Comment: social  . Drug use: No  .  Sexual activity: Yes    Birth control/protection: None, Surgical    Comment: tubal ligation   Other Topics Concern  . Not on file  Social History Narrative  . Not on file   Social Determinants of Health   Financial Resource Strain:   . Difficulty of Paying Living Expenses: Not on file  Food Insecurity:   . Worried About Charity fundraiser in the Last Year: Not on file  . Ran Out of Food in the Last Year: Not on file  Transportation Needs:   . Lack of Transportation (Medical): Not on file  . Lack of Transportation (Non-Medical): Not on file  Physical Activity:   . Days of Exercise per Week: Not on file  . Minutes of Exercise per Session: Not on file  Stress:   . Feeling of Stress : Not on file  Social Connections:   . Frequency of Communication with Friends and Family: Not on file  . Frequency of Social Gatherings with Friends and Family: Not on file  . Attends Religious Services: Not on file  . Active Member of Clubs or Organizations: Not on file  . Attends Archivist Meetings: Not on file  . Marital Status: Not on file  Intimate Partner Violence:   . Fear of Current or Ex-Partner: Not on file  .  Emotionally Abused: Not on file  . Physically Abused: Not on file  . Sexually Abused: Not on file     Constitutional: Denies fever, malaise, fatigue, headache or abrupt weight changes.  Respiratory: Denies difficulty breathing, shortness of breath, cough or sputum production.   Cardiovascular: Denies chest pain, chest tightness, palpitations or swelling in the hands or feet.  Gastrointestinal: Denies abdominal pain, bloating, constipation, diarrhea or blood in the stool.  GU: Pt reports heavy menstrual periods. Denies urgency, frequency, pain with urination, burning sensation, blood in urine, odor or discharge. Neurological: Denies dizziness, difficulty with memory, difficulty with speech or problems with balance and coordination.  Psych: Pt reports moodiness and  irritability. Denies anxiety, depression, SI/HI.  No other specific complaints in a complete review of systems (except as listed in HPI above).  Objective:   Physical Exam  BP 136/86   Pulse 71   Temp 98.3 F (36.8 C) (Temporal)   Wt 273 lb (123.8 kg)   LMP 12/12/2019   SpO2 99%   BMI 42.76 kg/m   Wt Readings from Last 3 Encounters:  10/18/19 (!) 266 lb (120.7 kg)  10/04/19 267 lb (121.1 kg)  07/08/19 267 lb 4 oz (121.2 kg)    General: Appears her stated age, obese, in NAD. Cardiovascular: Normal rate. Pulmonary/Chest: Normal effort. GU: Deferred. Neurological: Alert and oriented.   BMET    Component Value Date/Time   NA 140 07/08/2019 1633   NA 138 09/28/2017 1005   K 4.3 07/08/2019 1633   CL 105 07/08/2019 1633   CO2 24 07/08/2019 1633   GLUCOSE 84 07/08/2019 1633   BUN 12 07/08/2019 1633   BUN 12 09/28/2017 1005   CREATININE 0.90 07/08/2019 1633   CALCIUM 9.5 07/08/2019 1633   GFRNONAA 80 09/28/2017 1005   GFRAA 92 09/28/2017 1005    Lipid Panel     Component Value Date/Time   CHOL 185 09/28/2017 1005   TRIG 66 09/28/2017 1005   HDL 59 09/28/2017 1005   CHOLHDL 3.1 09/28/2017 1005   LDLCALC 113 (H) 09/28/2017 1005    CBC    Component Value Date/Time   WBC 11.1 (H) 07/08/2019 1633   RBC 5.23 (H) 07/08/2019 1633   HGB 10.6 (L) 07/08/2019 1633   HGB 10.4 (L) 09/28/2017 1005   HCT 36.3 07/08/2019 1633   HCT 34.0 09/28/2017 1005   PLT 293 07/08/2019 1633   PLT 294 09/28/2017 1005   MCV 69.4 (L) 07/08/2019 1633   MCV 68 (L) 09/28/2017 1005   MCH 20.3 (L) 07/08/2019 1633   MCHC 29.2 (L) 07/08/2019 1633   RDW 15.9 (H) 07/08/2019 1633   RDW 18.1 (H) 09/28/2017 1005   LYMPHSABS 2,930 07/08/2019 1633   EOSABS 289 07/08/2019 1633   BASOSABS 56 07/08/2019 1633    Hgb A1C Lab Results  Component Value Date   HGBA1C 5.6 05/25/2018           Assessment & Plan:  Menorrhagia with Regular Cycle:  Discussed that since she is having regular  menses, she is not menopausal despite the fact that she is having perimenopausal symptoms.  Discussed why checking the FSH/LH would not be beneficial at this time Discussed treatment for thick endometrial lining- hormonal therapy She would like to get an IUD, advised her to follow up with GYN to get this done  Return precautions discussed  Webb Silversmith, NP This visit occurred during the SARS-CoV-2 public health emergency.  Safety protocols were in place, including screening questions  prior to the visit, additional usage of staff PPE, and extensive cleaning of exam room while observing appropriate contact time as indicated for disinfecting solutions.

## 2019-12-15 NOTE — Patient Instructions (Signed)
Levonorgestrel intrauterine device (IUD) What is this medicine? LEVONORGESTREL IUD (LEE voe nor jes trel) is a contraceptive (birth control) device. The device is placed inside the uterus by a healthcare professional. It is used to prevent pregnancy. This device can also be used to treat heavy bleeding that occurs during your period. This medicine may be used for other purposes; ask your health care provider or pharmacist if you have questions. COMMON BRAND NAME(S): Kyleena, LILETTA, Mirena, Skyla What should I tell my health care provider before I take this medicine? They need to know if you have any of these conditions:  abnormal Pap smear  cancer of the breast, uterus, or cervix  diabetes  endometritis  genital or pelvic infection now or in the past  have more than one sexual partner or your partner has more than one partner  heart disease  history of an ectopic or tubal pregnancy  immune system problems  IUD in place  liver disease or tumor  problems with blood clots or take blood-thinners  seizures  use intravenous drugs  uterus of unusual shape  vaginal bleeding that has not been explained  an unusual or allergic reaction to levonorgestrel, other hormones, silicone, or polyethylene, medicines, foods, dyes, or preservatives  pregnant or trying to get pregnant  breast-feeding How should I use this medicine? This device is placed inside the uterus by a health care professional. Talk to your pediatrician regarding the use of this medicine in children. Special care may be needed. Overdosage: If you think you have taken too much of this medicine contact a poison control center or emergency room at once. NOTE: This medicine is only for you. Do not share this medicine with others. What if I miss a dose? This does not apply. Depending on the brand of device you have inserted, the device will need to be replaced every 3 to 6 years if you wish to continue using this type  of birth control. What may interact with this medicine? Do not take this medicine with any of the following medications:  amprenavir  bosentan  fosamprenavir This medicine may also interact with the following medications:  aprepitant  armodafinil  barbiturate medicines for inducing sleep or treating seizures  bexarotene  boceprevir  griseofulvin  medicines to treat seizures like carbamazepine, ethotoin, felbamate, oxcarbazepine, phenytoin, topiramate  modafinil  pioglitazone  rifabutin  rifampin  rifapentine  some medicines to treat HIV infection like atazanavir, efavirenz, indinavir, lopinavir, nelfinavir, tipranavir, ritonavir  St. John's wort  warfarin This list may not describe all possible interactions. Give your health care provider a list of all the medicines, herbs, non-prescription drugs, or dietary supplements you use. Also tell them if you smoke, drink alcohol, or use illegal drugs. Some items may interact with your medicine. What should I watch for while using this medicine? Visit your doctor or health care professional for regular check ups. See your doctor if you or your partner has sexual contact with others, becomes HIV positive, or gets a sexual transmitted disease. This product does not protect you against HIV infection (AIDS) or other sexually transmitted diseases. You can check the placement of the IUD yourself by reaching up to the top of your vagina with clean fingers to feel the threads. Do not pull on the threads. It is a good habit to check placement after each menstrual period. Call your doctor right away if you feel more of the IUD than just the threads or if you cannot feel the threads at   all. The IUD may come out by itself. You may become pregnant if the device comes out. If you notice that the IUD has come out use a backup birth control method like condoms and call your health care provider. Using tampons will not change the position of the  IUD and are okay to use during your period. This IUD can be safely scanned with magnetic resonance imaging (MRI) only under specific conditions. Before you have an MRI, tell your healthcare provider that you have an IUD in place, and which type of IUD you have in place. What side effects may I notice from receiving this medicine? Side effects that you should report to your doctor or health care professional as soon as possible:  allergic reactions like skin rash, itching or hives, swelling of the face, lips, or tongue  fever, flu-like symptoms  genital sores  high blood pressure  no menstrual period for 6 weeks during use  pain, swelling, warmth in the leg  pelvic pain or tenderness  severe or sudden headache  signs of pregnancy  stomach cramping  sudden shortness of breath  trouble with balance, talking, or walking  unusual vaginal bleeding, discharge  yellowing of the eyes or skin Side effects that usually do not require medical attention (report to your doctor or health care professional if they continue or are bothersome):  acne  breast pain  change in sex drive or performance  changes in weight  cramping, dizziness, or faintness while the device is being inserted  headache  irregular menstrual bleeding within first 3 to 6 months of use  nausea This list may not describe all possible side effects. Call your doctor for medical advice about side effects. You may report side effects to FDA at 1-800-FDA-1088. Where should I keep my medicine? This does not apply. NOTE: This sheet is a summary. It may not cover all possible information. If you have questions about this medicine, talk to your doctor, pharmacist, or health care provider.  2020 Elsevier/Gold Standard (2018-01-19 13:22:01) Menorrhagia Menorrhagia is when your menstrual periods are heavy or last longer than normal. Follow these instructions at home: Medicines   Take over-the-counter and  prescription medicines exactly as told by your doctor. This includes iron pills.  Do not change or switch medicines without asking your doctor.  Do not take aspirin or medicines that contain aspirin 1 week before or during your period. Aspirin may make bleeding worse. General instructions  If you need to change your pad or tampon more than once every 2 hours, limit your activity until the bleeding stops.  Iron pills can cause problems when pooping (constipation). To prevent or treat pooping problems while taking prescription iron pills, your doctor may suggest that you: ? Drink enough fluid to keep your pee (urine) clear or pale yellow. ? Take over-the-counter or prescription medicines. ? Eat foods that are high in fiber. These foods include:  Fresh fruits and vegetables.  Whole grains.  Beans. ? Limit foods that are high in fat and processed sugars. This includes fried and sweet foods.  Eat healthy meals and foods that are high in iron. Foods that have a lot of iron include: ? Leafy green vegetables. ? Meat. ? Liver. ? Eggs. ? Whole grain breads and cereals.  Do not try to lose weight until your heavy bleeding has stopped and you have normal amounts of iron in your blood. If you need to lose weight, work with your doctor.  Keep all follow-up visits  as told by your doctor. This is important. Contact a doctor if:  You soak through a pad or tampon every 1 or 2 hours, and this happens every time you have a period.  You need to use pads and tampons at the same time because you are bleeding so much.  You are taking medicine and you: ? Feel sick to your stomach (nauseous). ? Throw up (vomit). ? Have watery poop (diarrhea).  You have other problems that may be related to the medicine you are taking. Get help right away if:  You soak through more than a pad or tampon in 1 hour.  You pass clots bigger than 1 inch (2.5 cm) wide.  You feel short of breath.  You feel like your  heart is beating too fast.  You feel dizzy or you pass out (faint).  You feel very weak or tired. Summary  Menorrhagia is when your menstrual periods are heavy or last longer than normal.  Take over-the-counter and prescription medicines exactly as told by your doctor. This includes iron pills.  Contact a doctor if you soak through more than a pad or tampon in 1 hour or are passing large clots. This information is not intended to replace advice given to you by your health care provider. Make sure you discuss any questions you have with your health care provider. Document Revised: 06/17/2017 Document Reviewed: 03/31/2016 Elsevier Patient Education  [place].

## 2019-12-20 ENCOUNTER — Other Ambulatory Visit: Payer: Self-pay

## 2019-12-20 ENCOUNTER — Encounter: Payer: Self-pay | Admitting: Family Medicine

## 2019-12-20 ENCOUNTER — Ambulatory Visit (INDEPENDENT_AMBULATORY_CARE_PROVIDER_SITE_OTHER): Payer: BC Managed Care – PPO | Admitting: Family Medicine

## 2019-12-20 VITALS — BP 159/94 | HR 70 | Ht 67.0 in | Wt 272.0 lb

## 2019-12-20 DIAGNOSIS — Z30430 Encounter for insertion of intrauterine contraceptive device: Secondary | ICD-10-CM

## 2019-12-20 DIAGNOSIS — N920 Excessive and frequent menstruation with regular cycle: Secondary | ICD-10-CM

## 2019-12-20 MED ORDER — LEVONORGESTREL 20 MCG/24HR IU IUD: INTRAUTERINE_SYSTEM | Freq: Once | INTRAUTERINE | Status: AC

## 2019-12-20 NOTE — Progress Notes (Signed)
last unprotected sex as Friday, has a tubal

## 2019-12-20 NOTE — Progress Notes (Signed)
° ° °  Subjective:    Patient ID: Gabriella Riley is a 44 y.o. female presenting with Contraception  on 12/20/2019  HPI: Having significant bleeding. Last u/s in 2019. Having mood swings and would like an IUD. Has had BTL 9 years ago, 2012. Cycles are monthly and last for 7-8 days. Heavy first 2-5 days.  Cycle has been heavy for the past few years, following her BTL.  Review of Systems  Constitutional: Negative for chills and fever.  Respiratory: Negative for shortness of breath.   Cardiovascular: Negative for chest pain.  Gastrointestinal: Negative for abdominal pain, nausea and vomiting.  Genitourinary: Negative for dysuria.  Skin: Negative for rash.      Objective:    BP (!) 159/94    Pulse 70    Ht 5\' 7"  (1.702 m)    Wt 272 lb (123.4 kg)    LMP 12/12/2019    BMI 42.60 kg/m  Physical Exam Constitutional:      General: She is not in acute distress.    Appearance: She is well-developed.  HENT:     Head: Normocephalic and atraumatic.  Eyes:     General: No scleral icterus. Cardiovascular:     Rate and Rhythm: Normal rate.  Pulmonary:     Effort: Pulmonary effort is normal.  Abdominal:     Palpations: Abdomen is soft.  Musculoskeletal:     Cervical back: Neck supple.  Skin:    General: Skin is warm and dry.  Neurological:     Mental Status: She is alert and oriented to person, place, and time.   Pelvic sonogram 01/2018 Uterus  Measurements: 8.7 x 5.2 x 6.6 cm = volume: 155.3 mL. No fibroids or other mass visualized.  Endometrium Thickness: 14.8 mm.  No focal abnormality visualized.  Right ovary Measurements: 3.2 x 2.0 x 2.3 cm = volume: 7.5 mL. Normal appearance/no adnexal mass.  Left ovary Measurements: 2.7 x 1.6 x 1.8 cm = volume: 4.0 mL. Normal appearance/no adnexal mass.  Other findings  No abnormal free fluid.  IMPRESSION: 1. Endometrial stripe measures 14.8 mm in thickness. If bleeding remains unresponsive to hormonal or medical  therapy, sonohysterogram should be considered for focal lesion work-up. (Ref: Radiological Reasoning: Algorithmic Workup of Abnormal Vaginal Bleeding with Endovaginal Sonography and Sonohysterography. AJR 2008; [id number]). 2. Otherwise unremarkable and normal pelvic ultrasound.  Procedure: Patient identified, informed consent performed, signed copy in chart, time out was performed.  Urine pregnancy test negative.  Speculum placed in the vagina.  Cervix visualized.  Cleaned with Betadine x 2.  Grasped anteriourly with a single tooth tenaculum.  Uterus sounded to 8 cm.  Mirena IUD placed per manufacturer's recommendations.  Strings trimmed to 3 cm.   Patient given post procedure instructions and Mirena care card with expiration date.       Assessment & Plan:   Problem List Items Addressed This Visit      Unprioritized   Menorrhagia with regular cycle - Primary    Trial of progesterone IUD. If not improved, consider EMB. Usual effects discussed.          Total time in review of prior notes, pathology, labs, history taking, review with patient, exam, note writing, discussion of options, plan for next steps, alternatives and risks of treatment: 20 minutes.  Return in about 4 weeks (around 01/17/2020) for iud check.  Donnamae Jude 12/20/2019 3:57 PM

## 2019-12-20 NOTE — Patient Instructions (Signed)
Intrauterine Device Insertion, Care After  This sheet gives you information about how to care for yourself after your procedure. Your health care provider may also give you more specific instructions. If you have problems or questions, contact your health care provider. What can I expect after the procedure? After the procedure, it is common to have:  Cramps and pain in the abdomen.  Light bleeding (spotting) or heavier bleeding that is like your menstrual period. This may last for up to a few days.  Lower back pain.  Dizziness.  Headaches.  Nausea. Follow these instructions at home:  Before resuming sexual activity, check to make sure that you can feel the IUD string(s). You should be able to feel the end of the string(s) below the opening of your cervix. If your IUD string is in place, you may resume sexual activity. ? If you had a hormonal IUD inserted more than 7 days after your most recent period started, you will need to use a backup method of birth control for 7 days after IUD insertion. Ask your health care provider whether this applies to you.  Continue to check that the IUD is still in place by feeling for the string(s) after every menstrual period, or once a month.  Take over-the-counter and prescription medicines only as told by your health care provider.  Do not drive or use heavy machinery while taking prescription pain medicine.  Keep all follow-up visits as told by your health care provider. This is important. Contact a health care provider if:  You have bleeding that is heavier or lasts longer than a normal menstrual cycle.  You have a fever.  You have cramps or abdominal pain that get worse or do not get better with medicine.  You develop abdominal pain that is new or is not in the same area of earlier cramping and pain.  You feel lightheaded or weak.  You have abnormal or bad-smelling discharge from your vagina.  You have pain during sexual  activity.  You have any of the following problems with your IUD string(s): ? The string bothers or hurts you or your sexual partner. ? You cannot feel the string. ? The string has gotten longer.  You can feel the IUD in your vagina.  You think you may be pregnant, or you miss your menstrual period.  You think you may have an STI (sexually transmitted infection). Get help right away if:  You have flu-like symptoms.  You have a fever and chills.  You can feel that your IUD has slipped out of place. Summary  After the procedure, it is common to have cramps and pain in the abdomen. It is also common to have light bleeding (spotting) or heavier bleeding that is like your menstrual period.  Continue to check that the IUD is still in place by feeling for the string(s) after every menstrual period, or once a month.  Keep all follow-up visits as told by your health care provider. This is important.  Contact your health care provider if you have problems with your IUD string(s), such as the string getting longer or bothering you or your sexual partner. This information is not intended to replace advice given to you by your health care provider. Make sure you discuss any questions you have with your health care provider. Document Revised: 02/20/2017 Document Reviewed: 01/30/2016 Elsevier Patient Education  2020 Elsevier Inc.  

## 2019-12-20 NOTE — Assessment & Plan Note (Signed)
Trial of progesterone IUD. If not improved, consider EMB. Usual effects discussed.

## 2019-12-21 ENCOUNTER — Ambulatory Visit: Payer: BC Managed Care – PPO | Admitting: Advanced Practice Midwife

## 2020-01-17 ENCOUNTER — Ambulatory Visit: Payer: BC Managed Care – PPO | Admitting: Obstetrics & Gynecology

## 2020-01-20 ENCOUNTER — Ambulatory Visit (INDEPENDENT_AMBULATORY_CARE_PROVIDER_SITE_OTHER): Payer: BC Managed Care – PPO | Admitting: Nurse Practitioner

## 2020-01-20 ENCOUNTER — Encounter: Payer: Self-pay | Admitting: Nurse Practitioner

## 2020-01-20 ENCOUNTER — Other Ambulatory Visit: Payer: Self-pay

## 2020-01-20 VITALS — BP 165/101 | HR 69

## 2020-01-20 DIAGNOSIS — Z30431 Encounter for routine checking of intrauterine contraceptive device: Secondary | ICD-10-CM

## 2020-01-20 NOTE — Progress Notes (Addendum)
   Subjective:    Patient ID: Gabriella Riley, female    DOB: 06-20-1975, 44 y.o.   MRN: [id number]  HPI Gabriella Riley is a 44 y.o. female who presents to [facility] for IUD check. Patient reports no problems and doing well.   Review of Systems  Genitourinary: Negative for dyspareunia, dysuria, pelvic pain, vaginal bleeding and vaginal discharge.       Objective: BP (!) 165/101   Pulse 69     Physical Exam Vitals and nursing note reviewed. Exam conducted with a chaperone present.  Constitutional:      Appearance: Normal appearance.  Cardiovascular:     Rate and Rhythm: Normal rate.  Pulmonary:     Effort: Pulmonary effort is normal.  Genitourinary:    Vagina: Normal.     Cervix: Normal.     Comments: Mucous discharge, IUD string visible from cervix. No pain on exam.  Skin:    General: Skin is warm and dry.  Neurological:     Mental Status: She is alert.  Psychiatric:        Mood and Affect: Mood normal.       Assessment & Plan:  44 y.o. female here for IUD check. IUD in place. Discussed with the patient if she has problems prior to her annual exam in December to call the office. Patient voices understanding and agrees with plan.  BP is elevated today but patient reports she has not taken her BP medication. She will monitor her BP at home and f/u with her PCP if it continues to be elevated.

## 2020-01-20 NOTE — Patient Instructions (Addendum)

## 2020-01-26 ENCOUNTER — Telehealth: Payer: Self-pay | Admitting: *Deleted

## 2020-01-26 MED ORDER — FLUCONAZOLE 150 MG PO TABS: 150.0000 mg | ORAL_TABLET | Freq: Once | ORAL | 1 refills | Status: AC

## 2020-01-26 NOTE — Telephone Encounter (Signed)
Pt called stating she thinks she has a yeast infection  from using Honey Pot wipes. Will send in diflucan and informed pt if it's not better to come in for a swab to make sure nothing else is going on. Pt verbalizes and understands.

## 2020-02-29 ENCOUNTER — Other Ambulatory Visit: Payer: Self-pay | Admitting: Obstetrics & Gynecology

## 2020-02-29 DIAGNOSIS — Z1231 Encounter for screening mammogram for malignant neoplasm of breast: Secondary | ICD-10-CM

## 2020-03-13 ENCOUNTER — Encounter: Payer: Self-pay | Admitting: Obstetrics & Gynecology

## 2020-03-13 ENCOUNTER — Other Ambulatory Visit (HOSPITAL_COMMUNITY)
Admission: RE | Admit: 2020-03-13 | Discharge: 2020-03-13 | Disposition: A | Discharge status: Home / Self Care | Payer: BC Managed Care – PPO | Source: Ambulatory Visit | Attending: Obstetrics & Gynecology | Admitting: Obstetrics & Gynecology

## 2020-03-13 ENCOUNTER — Other Ambulatory Visit: Payer: Self-pay

## 2020-03-13 ENCOUNTER — Ambulatory Visit (INDEPENDENT_AMBULATORY_CARE_PROVIDER_SITE_OTHER): Payer: BC Managed Care – PPO | Admitting: Obstetrics & Gynecology

## 2020-03-13 VITALS — BP 148/97 | HR 71 | Ht 67.0 in | Wt 271.6 lb

## 2020-03-13 DIAGNOSIS — Z113 Encounter for screening for infections with a predominantly sexual mode of transmission: Secondary | ICD-10-CM | POA: Insufficient documentation

## 2020-03-13 DIAGNOSIS — Z01419 Encounter for gynecological examination (general) (routine) without abnormal findings: Secondary | ICD-10-CM | POA: Insufficient documentation

## 2020-03-13 DIAGNOSIS — B373 Candidiasis of vulva and vagina: Secondary | ICD-10-CM | POA: Insufficient documentation

## 2020-03-13 DIAGNOSIS — N9089 Other specified noninflammatory disorders of vulva and perineum: Secondary | ICD-10-CM

## 2020-03-13 DIAGNOSIS — B3731 Acute candidiasis of vulva and vagina: Secondary | ICD-10-CM

## 2020-03-13 MED ORDER — NYSTATIN-TRIAMCINOLONE 100000-0.1 UNIT/GM-% EX CREA
1.0000 | TOPICAL_CREAM | Freq: Three times a day (TID) | CUTANEOUS | 1 refills | Status: DC
Start: 2020-03-13 — End: 2020-07-19

## 2020-03-13 NOTE — Progress Notes (Signed)
GYNECOLOGY ANNUAL PREVENTATIVE CARE ENCOUNTER NOTE  History:     Gabriella Riley is a 44 y.o. [phone] female here for a routine annual gynecologic exam.  Current complaints: irritation of her labia after using [employer] products, has been scratching this area.  Also diffuse mild left breast pain, had this last year with negative evaluation.  Denies abnormal vaginal bleeding, discharge, pelvic pain, problems with intercourse or other gynecologic concerns.    Gynecologic History No LMP recorded. (Menstrual status: IUD). Contraception: tubal ligation Last Pap: 02/21/2019. Results were: normal with negative HPV Last mammogram: 04/14/2019. Results were: normal  Obstetric History OB History  Gravida Para Term Preterm AB Living  4 4 4  0 0 4  SAB IAB Ectopic Multiple Live Births  0 0 0 0 4    # Outcome Date GA Lbr Len/2nd Weight Sex Delivery Anes PTL Lv  4 Term 08/22/10 [redacted]w[redacted]d  7 lb 1 oz (3.204 kg) F Vag-Spont   LIV     Complications: Pre-eclampsia  3 Term 05/14/09 [redacted]w[redacted]d  8 lb 3 oz (3.714 kg) M Vag-Spont   LIV     Complications: Pre-eclampsia  2 Term 06/26/00 [redacted]w[redacted]d  6 lb 11 oz (3.033 kg) M Vag-Spont   LIV  1 Term 07/26/95 [redacted]w[redacted]d  7 lb 9 oz (3.43 kg) F Vag-Spont   LIV    Past Medical History:  Diagnosis Date  . Arthritis   . GERD (gastroesophageal reflux disease)   . Hyperlipidemia   . Hypertension     Past Surgical History:  Procedure Laterality Date  . BREAST BIOPSY Left 2007  . BREAST EXCISIONAL BIOPSY    . TUBAL LIGATION      Current Outpatient Medications on File Prior to Visit  Medication Sig Dispense Refill  . acetaminophen (TYLENOL) 500 MG tablet Take 500-1,000 mg by mouth every 6 (six) hours as needed (for pain.).    Marland Kitchen losartan (COZAAR) 25 MG tablet Take 1 tablet (25 mg total) by mouth daily. 90 tablet 1  . Multiple Vitamins-Minerals (ADULT GUMMY PO) Take 2 tablets by mouth daily.    Marland Kitchen triamcinolone cream (KENALOG) 0.1 % Apply 1 application topically 2  (two) times daily. (Patient not taking: Reported on 03/13/2020) 30 g 0  . [DISCONTINUED] fluticasone (FLONASE) 50 MCG/ACT nasal spray Place 2 sprays into both nostrils daily. 16 g 1  . [DISCONTINUED] omeprazole (PRILOSEC) 20 MG capsule Take 1 capsule (20 mg total) by mouth daily. 30 capsule 3   No current facility-administered medications on file prior to visit.    Allergies  Allergen Reactions  . Latex Rash    Social History:  reports that she has never smoked. She has never used smokeless tobacco. She reports current alcohol use. She reports that she does not use drugs.  Family History  Problem Relation Age of Onset  . Hyperlipidemia Father   . Hypertension Father   . Heart failure Father   . Arthritis Maternal Grandmother     The following portions of the patient's history were reviewed and updated as appropriate: allergies, current medications, past family history, past medical history, past social history, past surgical history and problem list.  Review of Systems Pertinent items noted in HPI and remainder of comprehensive ROS otherwise negative.  Physical Exam:  BP (!) 148/97   Pulse 71   Ht 5\' 7"  (1.702 m)   Wt 271 lb 9.6 oz (123.2 kg)   BMI 42.54 kg/m  CONSTITUTIONAL: Well-developed, well-nourished female in no acute  distress.  HENT:  Normocephalic, atraumatic, External right and left ear normal.  EYES: Conjunctivae and EOM are normal. Pupils are equal, round, and reactive to light. No scleral icterus.  NECK: Normal range of motion, supple, no masses.  Normal thyroid.  SKIN: Skin is warm and dry. No rash noted. Not diaphoretic. No erythema. No pallor. MUSCULOSKELETAL: Normal range of motion. No tenderness.  No cyanosis, clubbing, or edema.   NEUROLOGIC: Alert and oriented to person, place, and time. Normal reflexes, muscle tone coordination.  PSYCHIATRIC: Normal mood and affect. Normal behavior. Normal judgment and thought content. CARDIOVASCULAR: Normal heart rate  noted, regular rhythm RESPIRATORY: Clear to auscultation bilaterally. Effort and breath sounds normal, no problems with respiration noted. BREASTS: Symmetric in size. No masses, tenderness, skin changes, nipple drainage, or lymphadenopathy bilaterally. Performed in the presence of a chaperone. ABDOMEN: Soft, no distention noted.  No tenderness, rebound or guarding.  PELVIC: Diffuse erythema and excoriation noted on external bilateral labia majora, some excoriated areas resemble tiny ulcerations. No drainage, no tenderness.  HSV culture obtained.  Normal urethral meatus; normal appearing vaginal mucosa and cervix.  White discharge noted, cervicovaginal sample and Pap smear obtained.  Normal uterine size, no other palpable masses, no uterine or adnexal tenderness.  Performed in the presence of a chaperone.   Assessment and Plan:      1. Vulvar irritation Likely contact dermatitis 2/2 exposure to new agent used in that area. Cautioned about use of different substances around the vulvovaginal area.  Proper vulvar hygiene emphasized: discussed avoidance of perfumed  Or dyed soaps, detergents, lotions and any type of douches; in addition to wearing cotton underwear and no underwear at night.  Also recommended cleaning front to back, voiding and cleaning up after intercourse.  Mycolog prescribed, will monitor response. - Cervicovaginal ancillary only - nystatin-triamcinolone (MYCOLOG II) cream; Apply 1 application topically 3 (three) times daily.  Dispense: 30 g; Refill: 1 - Herpes simplex virus culture  2. Well woman exam with routine gynecological exam - Cytology - PAP Will follow up results of pap smear and manage accordingly. Reassured about left breast exam, likely hormonal, will follow up scheduled mammogram results; scheduled 04/16/2020. Routine preventative health maintenance measures emphasized. Please refer to After Visit Summary for other counseling recommendations.      Verita Schneiders, MD,  [place] [facility], [place]

## 2020-03-13 NOTE — Patient Instructions (Signed)
Preventive Care 43-43 Years Old, Female Preventive care refers to visits with your health care provider and lifestyle choices that can promote health and wellness. This includes:  A yearly physical exam. This may also be called an annual well check.  Regular dental visits and eye exams.  Immunizations.  Screening for certain conditions.  Healthy lifestyle choices, such as eating a healthy diet, getting regular exercise, not using drugs or products that contain nicotine and tobacco, and limiting alcohol use. What can I expect for my preventive care visit? Physical exam Your health care provider will check your:  Height and weight. This may be used to calculate body mass index (BMI), which tells if you are at a healthy weight.  Heart rate and blood pressure.  Skin for abnormal spots. Counseling Your health care provider may ask you questions about your:  Alcohol, tobacco, and drug use.  Emotional well-being.  Home and relationship well-being.  Sexual activity.  Eating habits.  Work and work environment.  Method of birth control.  Menstrual cycle.  Pregnancy history. What immunizations do I need?  Influenza (flu) vaccine  This is 43 recommended every year. Tetanus, diphtheria, and pertussis (Tdap) vaccine  You may need a Td booster every 10 years. Varicella (chickenpox) vaccine  You may need this if you have not been vaccinated. Zoster (shingles) vaccine  You may need this after age 60. Measles, mumps, and rubella (MMR) vaccine  You may need at least one dose of MMR if you were born in 1957 or later. You may also need a second dose. Pneumococcal conjugate (PCV13) vaccine  You may need this if you have certain conditions and were not previously vaccinated. Pneumococcal polysaccharide (PPSV23) vaccine  You may need one or two doses if you smoke cigarettes or if you have certain conditions. Meningococcal conjugate (MenACWY) vaccine  You may need this if you  have certain conditions. Hepatitis A vaccine  You may need this if you have certain conditions or if you travel or work in places where you may be exposed to hepatitis A. Hepatitis B vaccine  You may need this if you have certain conditions or if you travel or work in places where you may be exposed to hepatitis B. Haemophilus influenzae type b (Hib) vaccine  You may need this if you have certain conditions. Human papillomavirus (HPV) vaccine  If recommended by your health care provider, you may need three doses over 6 months. You may receive vaccines as individual doses or as more than one vaccine together in one shot (combination vaccines). Talk with your health care provider about the risks and benefits of combination vaccines. What tests do I need? Blood tests  Lipid and cholesterol levels. These may be checked every 5 years, or more frequently if you are over 43 years old.  Hepatitis C test.  Hepatitis B test. Screening  Lung cancer screening. You may have this screening every year starting at age 43 if you have a 30-pack-year history of smoking and currently smoke or have quit within the past 15 years.  Colorectal cancer screening. All adults should have this screening starting at age 43 and continuing until age 75. Your health care provider may recommend screening at age 43 if you are at increased risk. You will have tests every 1-10 years, depending on your results and the type of screening test.  Diabetes screening. This is done by checking your blood sugar (glucose) after you have not eaten for a while (fasting). You may have this   done every 1-3 years.  Mammogram. This may be done every 1-2 years. Talk with your health care provider about when you should start having regular mammograms. This may depend on whether you have a family history of breast cancer.  BRCA-related cancer screening. This may be done if you have a family history of breast, ovarian, tubal, or peritoneal  cancers.  Pelvic exam and Pap test. This may be done every 3 years starting at age 43. Starting at age 43, this may be done every 5 years if you have a Pap test in combination with an HPV test. Other tests  Sexually transmitted disease (STD) testing.  Bone density scan. This is done to screen for osteoporosis. You may have this scan if you are at high risk for osteoporosis. Follow these instructions at home: Eating and drinking  Eat a diet that includes fresh fruits and vegetables, whole grains, lean protein, and low-fat dairy.  Take vitamin and mineral supplements as recommended by your health care provider.  Do not drink alcohol if: ? Your health care provider tells you not to drink. ? You are pregnant, may be pregnant, or are planning to become pregnant.  If you drink alcohol: ? Limit how much you have to 0-1 drink a day. ? Be aware of how much alcohol is in your drink. In the [place] one drink equals one 12 oz bottle of beer (355 mL), one 5 oz glass of wine (148 mL), or one 1 oz glass of hard liquor (44 mL). Lifestyle  Take daily care of your teeth and gums.  Stay active. Exercise for at least 30 minutes on 5 or more days each week.  Do not use any products that contain nicotine or tobacco, such as cigarettes, e-cigarettes, and chewing tobacco. If you need help quitting, ask your health care provider.  If you are sexually active, practice safe sex. Use a condom or other form of birth control (contraception) in order to prevent pregnancy and STIs (sexually transmitted infections).  If told by your health care provider, take low-dose aspirin daily starting at age 43. What's next?  Visit your health care provider once a year for a well check visit.  Ask your health care provider how often you should have your eyes and teeth checked.  Stay up to date on all vaccines. This information is not intended to replace advice given to you by your health care provider. Make sure you  discuss any questions you have with your health care provider. Document Revised: 11/19/2017 Document Reviewed: 11/19/2017 Elsevier Patient Education  2020 [employer].

## 2020-03-14 LAB — CYTOLOGY - PAP
Comment: NEGATIVE
Diagnosis: NEGATIVE
High risk HPV: NEGATIVE

## 2020-03-14 LAB — CERVICOVAGINAL ANCILLARY ONLY
Bacterial Vaginitis (gardnerella): NEGATIVE
Candida Glabrata: NEGATIVE
Candida Vaginitis: POSITIVE — AB
Chlamydia: NEGATIVE
Comment: NEGATIVE
Comment: NEGATIVE
Comment: NEGATIVE
Comment: NEGATIVE
Comment: NEGATIVE
Comment: NORMAL
Neisseria Gonorrhea: NEGATIVE
Trichomonas: NEGATIVE

## 2020-03-15 LAB — HERPES SIMPLEX VIRUS CULTURE

## 2020-03-15 MED ORDER — FLUCONAZOLE 150 MG PO TABS: 150.0000 mg | ORAL_TABLET | Freq: Once | ORAL | 3 refills | Status: AC

## 2020-03-15 NOTE — Addendum Note (Signed)
Addended by: Verita Schneiders A on: 03/15/2020 03:46 PM   Modules accepted: Orders

## 2020-04-16 ENCOUNTER — Other Ambulatory Visit: Payer: Self-pay

## 2020-04-16 ENCOUNTER — Ambulatory Visit
Admission: RE | Admit: 2020-04-16 | Discharge: 2020-04-16 | Disposition: A | Discharge status: Home / Self Care | Payer: BC Managed Care – PPO | Source: Ambulatory Visit | Attending: Obstetrics & Gynecology | Admitting: Obstetrics & Gynecology

## 2020-04-16 DIAGNOSIS — Z1231 Encounter for screening mammogram for malignant neoplasm of breast: Secondary | ICD-10-CM

## 2020-05-10 ENCOUNTER — Ambulatory Visit (INDEPENDENT_AMBULATORY_CARE_PROVIDER_SITE_OTHER): Payer: BC Managed Care – PPO | Admitting: Internal Medicine

## 2020-05-10 ENCOUNTER — Other Ambulatory Visit: Payer: Self-pay

## 2020-05-10 VITALS — BP 140/84 | HR 73 | Temp 97.0°F | Wt 279.8 lb

## 2020-05-10 DIAGNOSIS — R21 Rash and other nonspecific skin eruption: Secondary | ICD-10-CM | POA: Diagnosis not present

## 2020-05-10 NOTE — Patient Instructions (Signed)
Contact Dermatitis Dermatitis is redness, soreness, and swelling (inflammation) of the skin. Contact dermatitis is a reaction to something that touches the skin. There are two types of contact dermatitis:  Irritant contact dermatitis. This happens when something bothers (irritates) your skin, like soap.  Allergic contact dermatitis. This is caused when you are exposed to something that you are allergic to, such as poison ivy. What are the causes?  Common causes of irritant contact dermatitis include: ? Makeup. ? Soaps. ? Detergents. ? Bleaches. ? Acids. ? Metals, such as nickel.  Common causes of allergic contact dermatitis include: ? Plants. ? Chemicals. ? Jewelry. ? Latex. ? Medicines. ? Preservatives in products, such as clothing. What increases the risk?  Having a job that exposes you to things that bother your skin.  Having asthma or eczema. What are the signs or symptoms? Symptoms may happen anywhere the irritant has touched your skin. Symptoms include:  Dry or flaky skin.  Redness.  Cracks.  Itching.  Pain or a burning feeling.  Blisters.  Blood or clear fluid draining from skin cracks. With allergic contact dermatitis, swelling may occur. This may happen in places such as the eyelids, mouth, or genitals.   How is this treated?  This condition is treated by checking for the cause of the reaction and protecting your skin. Treatment may also include: ? Steroid creams, ointments, or medicines. ? Antibiotic medicines or other ointments, if you have a skin infection. ? Lotion or medicines to help with itching. ? A bandage (dressing). Follow these instructions at home: Skin care  Moisturize your skin as needed.  Put cool cloths on your skin.  Put a baking soda paste on your skin. Stir water into baking soda until it looks like a paste.  Do not scratch your skin.  Avoid having things rub up against your skin.  Avoid the use of soaps, perfumes, and  dyes. Medicines  Take or apply over-the-counter and prescription medicines only as told by your doctor.  If you were prescribed an antibiotic medicine, take or apply it as told by your doctor. Do not stop using it even if your condition starts to get better. Bathing  Take a bath with: ? Epsom salts. ? Baking soda. ? Colloidal oatmeal.  Bathe less often.  Bathe in warm water. Avoid using hot water. Bandage care  If you were given a bandage, change it as told by your health care provider.  Wash your hands with soap and water before and after you change your bandage. If soap and water are not available, use hand sanitizer. General instructions  Avoid the things that caused your reaction. If you do not know what caused it, keep a journal. Write down: ? What you eat. ? What skin products you use. ? What you drink. ? What you wear in the area that has symptoms. This includes jewelry.  Check the affected areas every day for signs of infection. Check for: ? More redness, swelling, or pain. ? More fluid or blood. ? Warmth. ? Pus or a bad smell.  Keep all follow-up visits as told by your doctor. This is important. Contact a doctor if:  You do not get better with treatment.  Your condition gets worse.  You have signs of infection, such as: ? More swelling. ? Tenderness. ? More redness. ? Soreness. ? Warmth.  You have a fever.  You have new symptoms. Get help right away if:  You have a very bad headache.  You have  neck pain.  Your neck is stiff.  You throw up (vomit).  You feel very sleepy.  You see red streaks coming from the area.  Your bone or joint near the area hurts after the skin has healed.  The area turns darker.  You have trouble breathing. Summary  Dermatitis is redness, soreness, and swelling of the skin.  Symptoms may occur where the irritant has touched you.  Treatment may include medicines and skin care.  If you do not know what caused  your reaction, keep a journal.  Contact a doctor if your condition gets worse or you have signs of infection. This information is not intended to replace advice given to you by your health care provider. Make sure you discuss any questions you have with your health care provider. Document Revised: 06/30/2018 Document Reviewed: 09/23/2017 Elsevier Patient Education  [employer].

## 2020-05-10 NOTE — Progress Notes (Signed)
Subjective:    Patient ID: Gabriella Riley, female    DOB: 11/14/1975, 45 y.o.   MRN: [id number]  HPI  Pt presents to the clinic today with c/o rash of her left breast.  She noticed this a few days ago.  The rash itches but has not seemed to spread.  She denies changes in soaps, lotions or detergents.  No one in her home has a similar rash.  She denies breast pain, changes in skin or discharge from the nipple.  Mammogram from 03/2020 was normal.  Review of Systems      Past Medical History:  Diagnosis Date  . Arthritis   . GERD (gastroesophageal reflux disease)   . Hyperlipidemia   . Hypertension     Current Outpatient Medications  Medication Sig Dispense Refill  . acetaminophen (TYLENOL) 500 MG tablet Take 500-1,000 mg by mouth every 6 (six) hours as needed (for pain.).    Marland Kitchen losartan (COZAAR) 25 MG tablet Take 1 tablet (25 mg total) by mouth daily. 90 tablet 1  . Multiple Vitamins-Minerals (ADULT GUMMY PO) Take 2 tablets by mouth daily.    Marland Kitchen nystatin-triamcinolone (MYCOLOG II) cream Apply 1 application topically 3 (three) times daily. 30 g 1  . triamcinolone cream (KENALOG) 0.1 % Apply 1 application topically 2 (two) times daily. (Patient not taking: Reported on 03/13/2020) 30 g 0   No current facility-administered medications for this visit.    Allergies  Allergen Reactions  . Latex Rash    Family History  Problem Relation Age of Onset  . Hyperlipidemia Father   . Hypertension Father   . Heart failure Father   . Arthritis Maternal Grandmother     Social History   Socioeconomic History  . Marital status: Divorced    Spouse name: Not on file  . Number of children: Not on file  . Years of education: Not on file  . Highest education level: Not on file  Occupational History  . Not on file  Tobacco Use  . Smoking status: Never Smoker  . Smokeless tobacco: Never Used  Substance and Sexual Activity  . Alcohol use: Yes    Alcohol/week: 0.0 standard  drinks    Comment: social  . Drug use: No  . Sexual activity: Yes    Birth control/protection: None, Surgical    Comment: tubal ligation   Other Topics Concern  . Not on file  Social History Narrative  . Not on file   Social Determinants of Health   Financial Resource Strain: Not on file  Food Insecurity: Not on file  Transportation Needs: Not on file  Physical Activity: Not on file  Stress: Not on file  Social Connections: Not on file  Intimate Partner Violence: Not on file     Constitutional: Denies fever, malaise, fatigue, headache or abrupt weight changes.  Respiratory: Denies difficulty breathing, shortness of breath, cough or sputum production.   Cardiovascular: Denies chest pain, chest tightness, palpitations or swelling in the hands or feet.  Skin: Patient reports rash of left breast.  Denies ulcercations.   No other specific complaints in a complete review of systems (except as listed in HPI above).  Objective:   Physical Exam   BP 140/84   Pulse 73   Temp (!) 97 F (36.1 C) (Temporal)   Wt 126.9 kg   SpO2 98%   BMI 43.82 kg/m   Wt Readings from Last 3 Encounters:  03/13/20 271 lb 9.6 oz (123.2 kg)  12/20/19 272  lb (123.4 kg)  12/15/19 273 lb (123.8 kg)    General: Appears her stated age, obese, in NAD. Skin: Warm, dry and intact.  Grouped, maculopapular lesions noted of left lateral breast. Cardiovascular: Normal rate and rhythm. S1,S2 noted.  No murmur, rubs or gallops noted.  Pulmonary/Chest: Normal effort and positive vesicular breath sounds. No respiratory distress. No wheezes, rales or ronchi noted.  Neurological: Alert and oriented.     BMET    Component Value Date/Time   NA 140 07/08/2019 1633   NA 138 09/28/2017 1005   K 4.3 07/08/2019 1633   CL 105 07/08/2019 1633   CO2 24 07/08/2019 1633   GLUCOSE 84 07/08/2019 1633   BUN 12 07/08/2019 1633   BUN 12 09/28/2017 1005   CREATININE 0.90 07/08/2019 1633   CALCIUM 9.5 07/08/2019 1633    GFRNONAA 80 09/28/2017 1005   GFRAA 92 09/28/2017 1005    Lipid Panel     Component Value Date/Time   CHOL 185 09/28/2017 1005   TRIG 66 09/28/2017 1005   HDL 59 09/28/2017 1005   CHOLHDL 3.1 09/28/2017 1005   LDLCALC 113 (H) 09/28/2017 1005    CBC    Component Value Date/Time   WBC 11.1 (H) 07/08/2019 1633   RBC 5.23 (H) 07/08/2019 1633   HGB 10.6 (L) 07/08/2019 1633   HGB 10.4 (L) 09/28/2017 1005   HCT 36.3 07/08/2019 1633   HCT 34.0 09/28/2017 1005   PLT 293 07/08/2019 1633   PLT 294 09/28/2017 1005   MCV 69.4 (L) 07/08/2019 1633   MCV 68 (L) 09/28/2017 1005   MCH 20.3 (L) 07/08/2019 1633   MCHC 29.2 (L) 07/08/2019 1633   RDW 15.9 (H) 07/08/2019 1633   RDW 18.1 (H) 09/28/2017 1005   LYMPHSABS 2,930 07/08/2019 1633   EOSABS 289 07/08/2019 1633   BASOSABS 56 07/08/2019 1633    Hgb A1C Lab Results  Component Value Date   HGBA1C 5.6 05/25/2018           Assessment & Plan:   Rash of Left Breast:  Clinically improving from the picture she showed me Use Hydrocortisone 1% OTC twice daily as needed  Return precautions discussed Webb Silversmith, NP This visit occurred during the SARS-CoV-2 public health emergency.  Safety protocols were in place, including screening questions prior to the visit, additional usage of staff PPE, and extensive cleaning of exam room while observing appropriate contact time as indicated for disinfecting solutions.

## 2020-05-14 ENCOUNTER — Encounter: Payer: Self-pay | Admitting: Internal Medicine

## 2020-05-30 ENCOUNTER — Telehealth (INDEPENDENT_AMBULATORY_CARE_PROVIDER_SITE_OTHER): Payer: Medicaid Other | Admitting: Internal Medicine

## 2020-05-30 ENCOUNTER — Encounter: Payer: Self-pay | Admitting: Internal Medicine

## 2020-05-30 VITALS — Temp 98.9°F

## 2020-05-30 DIAGNOSIS — R059 Cough, unspecified: Secondary | ICD-10-CM

## 2020-05-30 MED ORDER — PREDNISONE 10 MG PO TABS: ORAL_TABLET | ORAL | 0 refills | Status: DC

## 2020-05-30 NOTE — Patient Instructions (Signed)

## 2020-05-30 NOTE — Progress Notes (Signed)
Virtual Visit via Video Note  I connected with Gabriella Riley on 05/30/20 at  3:00 PM EST by a video enabled telemedicine application and verified that I am speaking with the correct person using two identifiers.  Location: Patient: In her car Provider: Office  Person's participating in this video call: Webb Silversmith, NP-C and Cathleen Corti.   I discussed the limitations of evaluation and management by telemedicine and the availability of in person appointments. The patient expressed understanding and agreed to proceed.  History of Present Illness:  Pt reports cough. This started 4 days ago. The cough is productive of yellow mucous. She denies headache, runny nose, nasal congestion, ear pain, sore throat, loss of taste/smell, or SOB. She has had some chest tightness but denies chest pain. She denies fever or body aches but did have some chills last night that have resolved. She denies nausea, vomiting and diarrhea. She has tried Dayquil, Nyquil and Thera flu OTC with some relief of symptoms. She denies covid exposure and has had a negative covid vaccine.  Past Medical History:  Diagnosis Date  . Arthritis   . GERD (gastroesophageal reflux disease)   . Hyperlipidemia   . Hypertension     Current Outpatient Medications  Medication Sig Dispense Refill  . acetaminophen (TYLENOL) 500 MG tablet Take 500-1,000 mg by mouth every 6 (six) hours as needed (for pain.).    Marland Kitchen losartan (COZAAR) 25 MG tablet Take 1 tablet (25 mg total) by mouth daily. 90 tablet 1  . Multiple Vitamins-Minerals (ADULT GUMMY PO) Take 2 tablets by mouth daily.    Marland Kitchen nystatin-triamcinolone (MYCOLOG II) cream Apply 1 application topically 3 (three) times daily. 30 g 1  . triamcinolone cream (KENALOG) 0.1 % Apply 1 application topically 2 (two) times daily. (Patient not taking: Reported on 03/13/2020) 30 g 0   No current facility-administered medications for this visit.    Allergies  Allergen  Reactions  . Latex Rash    Family History  Problem Relation Age of Onset  . Hyperlipidemia Father   . Hypertension Father   . Heart failure Father   . Arthritis Maternal Grandmother     Social History   Socioeconomic History  . Marital status: Married    Spouse name: Not on file  . Number of children: Not on file  . Years of education: Not on file  . Highest education level: Not on file  Occupational History  . Not on file  Tobacco Use  . Smoking status: Never Smoker  . Smokeless tobacco: Never Used  Substance and Sexual Activity  . Alcohol use: Yes    Alcohol/week: 0.0 standard drinks    Comment: social  . Drug use: No  . Sexual activity: Yes    Birth control/protection: None, Surgical    Comment: tubal ligation   Other Topics Concern  . Not on file  Social History Narrative  . Not on file   Social Determinants of Health   Financial Resource Strain: Not on file  Food Insecurity: Not on file  Transportation Needs: Not on file  Physical Activity: Not on file  Stress: Not on file  Social Connections: Not on file  Intimate Partner Violence: Not on file     Constitutional: Denies fever, malaise, fatigue, headache or abrupt weight changes.  HEENT: Denies eye pain, eye redness, ear pain, ringing in the ears, wax buildup, runny nose, nasal congestion, bloody nose, or sore throat. Respiratory: Pt reports cough. Denies difficulty breathing, shortness of breath.  Cardiovascular: Denies chest pain, chest tightness, palpitations or swelling in the hands or feet.  Gastrointestinal: Denies abdominal pain, bloating, constipation, diarrhea or blood in the stool.    No other specific complaints in a complete review of systems (except as listed in HPI above).  Observations/Objective:  Temp 98.9 F (37.2 C) (Temporal)   Wt Readings from Last 3 Encounters:  05/10/20 279 lb 12 oz (126.9 kg)  03/13/20 271 lb 9.6 oz (123.2 kg)  12/20/19 272 lb (123.4 kg)    General:  Appears her stated age, obese, in NAD. HEENT: Head: normal shape and size; Nose: no congestion noted; Throat/Mouth: slight hoarseness noted. Pulmonary/Chest: Normal effort. Dry cough noted. No respiratory distress.  Neurological: Alert and oriented.   BMET    Component Value Date/Time   NA 140 07/08/2019 1633   NA 138 09/28/2017 1005   K 4.3 07/08/2019 1633   CL 105 07/08/2019 1633   CO2 24 07/08/2019 1633   GLUCOSE 84 07/08/2019 1633   BUN 12 07/08/2019 1633   BUN 12 09/28/2017 1005   CREATININE 0.90 07/08/2019 1633   CALCIUM 9.5 07/08/2019 1633   GFRNONAA 80 09/28/2017 1005   GFRAA 92 09/28/2017 1005    Lipid Panel     Component Value Date/Time   CHOL 185 09/28/2017 1005   TRIG 66 09/28/2017 1005   HDL 59 09/28/2017 1005   CHOLHDL 3.1 09/28/2017 1005   LDLCALC 113 (H) 09/28/2017 1005    CBC    Component Value Date/Time   WBC 11.1 (H) 07/08/2019 1633   RBC 5.23 (H) 07/08/2019 1633   HGB 10.6 (L) 07/08/2019 1633   HGB 10.4 (L) 09/28/2017 1005   HCT 36.3 07/08/2019 1633   HCT 34.0 09/28/2017 1005   PLT 293 07/08/2019 1633   PLT 294 09/28/2017 1005   MCV 69.4 (L) 07/08/2019 1633   MCV 68 (L) 09/28/2017 1005   MCH 20.3 (L) 07/08/2019 1633   MCHC 29.2 (L) 07/08/2019 1633   RDW 15.9 (H) 07/08/2019 1633   RDW 18.1 (H) 09/28/2017 1005   LYMPHSABS 2,930 07/08/2019 1633   EOSABS 289 07/08/2019 1633   BASOSABS 56 07/08/2019 1633    Hgb A1C Lab Results  Component Value Date   HGBA1C 5.6 05/25/2018       Assessment and Plan:  Cough:  Likely viral at this point RX for Pred Taper x 6 days Continue Dayquil/Nyquil and Theraflu if desired No indication for abx at this time but would consider Azithromycin by Monday if no improvement  Return precautions discussed  Follow Up Instructions:    I discussed the assessment and treatment plan with the patient. The patient was provided an opportunity to ask questions and all were answered. The patient agreed with the  plan and demonstrated an understanding of the instructions.   The patient was advised to call back or seek an in-person evaluation if the symptoms worsen or if the condition fails to improve as anticipated.    Webb Silversmith, NP

## 2020-07-17 ENCOUNTER — Ambulatory Visit (INDEPENDENT_AMBULATORY_CARE_PROVIDER_SITE_OTHER): Payer: BC Managed Care – PPO | Admitting: Podiatry

## 2020-07-17 ENCOUNTER — Ambulatory Visit (INDEPENDENT_AMBULATORY_CARE_PROVIDER_SITE_OTHER): Payer: BC Managed Care – PPO

## 2020-07-17 ENCOUNTER — Other Ambulatory Visit: Payer: Self-pay

## 2020-07-17 DIAGNOSIS — M722 Plantar fascial fibromatosis: Secondary | ICD-10-CM

## 2020-07-17 DIAGNOSIS — M79671 Pain in right foot: Secondary | ICD-10-CM | POA: Diagnosis not present

## 2020-07-17 DIAGNOSIS — M79672 Pain in left foot: Secondary | ICD-10-CM | POA: Diagnosis not present

## 2020-07-17 MED ORDER — MELOXICAM 15 MG PO TABS: 15.0000 mg | ORAL_TABLET | Freq: Every day | ORAL | 0 refills | Status: DC

## 2020-07-17 NOTE — Patient Instructions (Signed)
For instructions on how to put on your Night Splint, please visit [email or web address]   Plantar Fasciitis (Heel Spur Syndrome) with Rehab The plantar fascia is a fibrous, ligament-like, soft-tissue structure that spans the bottom of the foot. Plantar fasciitis is a condition that causes pain in the foot due to inflammation of the tissue. SYMPTOMS   Pain and tenderness on the underneath side of the foot.  Pain that worsens with standing or walking. CAUSES  Plantar fasciitis is caused by irritation and injury to the plantar fascia on the underneath side of the foot. Common mechanisms of injury include:  Direct trauma to bottom of the foot.  Damage to a small nerve that runs under the foot where the main fascia attaches to the heel bone.  Stress placed on the plantar fascia due to bone spurs. RISK INCREASES WITH:   Activities that place stress on the plantar fascia (running, jumping, pivoting, or cutting).  Poor strength and flexibility.  Improperly fitted shoes.  Tight calf muscles.  Flat feet.  Failure to warm-up properly before activity.  Obesity. PREVENTION  Warm up and stretch properly before activity.  Allow for adequate recovery between workouts.  Maintain physical fitness:  Strength, flexibility, and endurance.  Cardiovascular fitness.  Maintain a health body weight.  Avoid stress on the plantar fascia.  Wear properly fitted shoes, including arch supports for individuals who have flat feet.  PROGNOSIS  If treated properly, then the symptoms of plantar fasciitis usually resolve without surgery. However, occasionally surgery is necessary.  RELATED COMPLICATIONS   Recurrent symptoms that may result in a chronic condition.  Problems of the lower back that are caused by compensating for the injury, such as limping.  Pain or weakness of the foot during push-off following surgery.  Chronic inflammation, scarring, and partial or complete fascia tear,  occurring more often from repeated injections.  TREATMENT  Treatment initially involves the use of ice and medication to help reduce pain and inflammation. The use of strengthening and stretching exercises may help reduce pain with activity, especially stretches of the Achilles tendon. These exercises may be performed at home or with a therapist. Your caregiver may recommend that you use heel cups of arch supports to help reduce stress on the plantar fascia. Occasionally, corticosteroid injections are given to reduce inflammation. If symptoms persist for greater than 6 months despite non-surgical (conservative), then surgery may be recommended.   MEDICATION   If pain medication is necessary, then nonsteroidal anti-inflammatory medications, such as aspirin and ibuprofen, or other minor pain relievers, such as acetaminophen, are often recommended.  Do not take pain medication within 7 days before surgery.  Prescription pain relievers may be given if deemed necessary by your caregiver. Use only as directed and only as much as you need.  Corticosteroid injections may be given by your caregiver. These injections should be reserved for the most serious cases, because they may only be given a certain number of times.  HEAT AND COLD  Cold treatment (icing) relieves pain and reduces inflammation. Cold treatment should be applied for 10 to 15 minutes every 2 to 3 hours for inflammation and pain and immediately after any activity that aggravates your symptoms. Use ice packs or massage the area with a piece of ice (ice massage).  Heat treatment may be used prior to performing the stretching and strengthening activities prescribed by your caregiver, physical therapist, or athletic trainer. Use a heat pack or soak the injury in warm water.  SEEK IMMEDIATE MEDICAL CARE   IF:  Treatment seems to offer no benefit, or the condition worsens.  Any medications produce adverse side effects.  EXERCISES- RANGE OF  MOTION (ROM) AND STRETCHING EXERCISES - Plantar Fasciitis (Heel Spur Syndrome) These exercises may help you when beginning to rehabilitate your injury. Your symptoms may resolve with or without further involvement from your physician, physical therapist or athletic trainer. While completing these exercises, remember:   Restoring tissue flexibility helps normal motion to return to the joints. This allows healthier, less painful movement and activity.  An effective stretch should be held for at least 30 seconds.  A stretch should never be painful. You should only feel a gentle lengthening or release in the stretched tissue.  RANGE OF MOTION - Toe Extension, Flexion  Sit with your right / left leg crossed over your opposite knee.  Grasp your toes and gently pull them back toward the top of your foot. You should feel a stretch on the bottom of your toes and/or foot.  Hold this stretch for 10 seconds.  Now, gently pull your toes toward the bottom of your foot. You should feel a stretch on the top of your toes and or foot.  Hold this stretch for 10 seconds. Repeat  times. Complete this stretch 3 times per day.   RANGE OF MOTION - Ankle Dorsiflexion, Active Assisted  Remove shoes and sit on a chair that is preferably not on a carpeted surface.  Place right / left foot under knee. Extend your opposite leg for support.  Keeping your heel down, slide your right / left foot back toward the chair until you feel a stretch at your ankle or calf. If you do not feel a stretch, slide your bottom forward to the edge of the chair, while still keeping your heel down.  Hold this stretch for 10 seconds. Repeat 3 times. Complete this stretch 2 times per day.   STRETCH  Gastroc, Standing  Place hands on wall.  Extend right / left leg, keeping the front knee somewhat bent.  Slightly point your toes inward on your back foot.  Keeping your right / left heel on the floor and your knee straight, shift  your weight toward the wall, not allowing your back to arch.  You should feel a gentle stretch in the right / left calf. Hold this position for 10 seconds. Repeat 3 times. Complete this stretch 2 times per day.  STRETCH  Soleus, Standing  Place hands on wall.  Extend right / left leg, keeping the other knee somewhat bent.  Slightly point your toes inward on your back foot.  Keep your right / left heel on the floor, bend your back knee, and slightly shift your weight over the back leg so that you feel a gentle stretch deep in your back calf.  Hold this position for 10 seconds. Repeat 3 times. Complete this stretch 2 times per day.  STRETCH  Gastrocsoleus, Standing  Note: This exercise can place a lot of stress on your foot and ankle. Please complete this exercise only if specifically instructed by your caregiver.   Place the ball of your right / left foot on a step, keeping your other foot firmly on the same step.  Hold on to the wall or a rail for balance.  Slowly lift your other foot, allowing your body weight to press your heel down over the edge of the step.  You should feel a stretch in your right / left calf.  Hold this position   for 10 seconds.  Repeat this exercise with a slight bend in your right / left knee. Repeat 3 times. Complete this stretch 2 times per day.   STRENGTHENING EXERCISES - Plantar Fasciitis (Heel Spur Syndrome)  These exercises may help you when beginning to rehabilitate your injury. They may resolve your symptoms with or without further involvement from your physician, physical therapist or athletic trainer. While completing these exercises, remember:   Muscles can gain both the endurance and the strength needed for everyday activities through controlled exercises.  Complete these exercises as instructed by your physician, physical therapist or athletic trainer. Progress the resistance and repetitions only as guided.  STRENGTH - Towel Curls  Sit in  a chair positioned on a non-carpeted surface.  Place your foot on a towel, keeping your heel on the floor.  Pull the towel toward your heel by only curling your toes. Keep your heel on the floor. Repeat 3 times. Complete this exercise 2 times per day.  STRENGTH - Ankle Inversion  Secure one end of a rubber exercise band/tubing to a fixed object (table, pole). Loop the other end around your foot just before your toes.  Place your fists between your knees. This will focus your strengthening at your ankle.  Slowly, pull your big toe up and in, making sure the band/tubing is positioned to resist the entire motion.  Hold this position for 10 seconds.  Have your muscles resist the band/tubing as it slowly pulls your foot back to the starting position. Repeat 3 times. Complete this exercises 2 times per day.  Document Released: 03/10/2005 Document Revised: 06/02/2011 Document Reviewed: 06/22/2008 [employer] Patient Information 2014 [employer].  

## 2020-07-17 NOTE — Progress Notes (Signed)
d 

## 2020-07-19 ENCOUNTER — Ambulatory Visit (INDEPENDENT_AMBULATORY_CARE_PROVIDER_SITE_OTHER): Payer: BC Managed Care – PPO | Admitting: Internal Medicine

## 2020-07-19 ENCOUNTER — Other Ambulatory Visit: Payer: Self-pay | Admitting: Podiatry

## 2020-07-19 ENCOUNTER — Encounter: Payer: Self-pay | Admitting: Internal Medicine

## 2020-07-19 ENCOUNTER — Other Ambulatory Visit: Payer: Self-pay

## 2020-07-19 VITALS — BP 137/86 | HR 70 | Temp 97.8°F | Resp 18 | Ht 67.0 in | Wt 274.6 lb

## 2020-07-19 DIAGNOSIS — L02412 Cutaneous abscess of left axilla: Secondary | ICD-10-CM | POA: Diagnosis not present

## 2020-07-19 DIAGNOSIS — M722 Plantar fascial fibromatosis: Secondary | ICD-10-CM

## 2020-07-19 MED ORDER — SULFAMETHOXAZOLE-TRIMETHOPRIM 800-160 MG PO TABS: 1.0000 | ORAL_TABLET | Freq: Two times a day (BID) | ORAL | 0 refills | Status: DC

## 2020-07-19 NOTE — Progress Notes (Signed)
Subjective:    Patient ID: Gabriella Riley, female    DOB: May 16, 1975, 45 y.o.   MRN: [id number]  HPI  Pt presents to the clinic today with c/o abscess in her left armpit. She noticed this about 1 month ago.  She reports the area has drained some pus and blood.  She does feel like it is less swollen than it has been but will not go away.  She denies fever, chills, nausea, vomiting or diarrhea.  She has tried warm compresses with minimal relief of symptoms.  She has had abscesses before that have had to be lanced and she would prefer if we did not have to lance this one today.  Review of Systems      Past Medical History:  Diagnosis Date  . Arthritis   . GERD (gastroesophageal reflux disease)   . Hyperlipidemia   . Hypertension     Current Outpatient Medications  Medication Sig Dispense Refill  . acetaminophen (TYLENOL) 500 MG tablet Take 500-1,000 mg by mouth every 6 (six) hours as needed (for pain.).    Marland Kitchen losartan (COZAAR) 25 MG tablet Take 1 tablet (25 mg total) by mouth daily. 90 tablet 1  . meloxicam (MOBIC) 15 MG tablet Take 1 tablet (15 mg total) by mouth daily. 30 tablet 0  . Multiple Vitamins-Minerals (ADULT GUMMY PO) Take 2 tablets by mouth daily.    Marland Kitchen nystatin-triamcinolone (MYCOLOG II) cream Apply 1 application topically 3 (three) times daily. 30 g 1  . predniSONE (DELTASONE) 10 MG tablet Take 6 tabs day 1, 5 tabs day 2, 4 tabs day 3, 3 tabs day 4, 2 tabs day 5, 1 tab day 6 21 tablet 0  . triamcinolone cream (KENALOG) 0.1 % Apply 1 application topically 2 (two) times daily. 30 g 0   No current facility-administered medications for this visit.    Allergies  Allergen Reactions  . Latex Rash    Family History  Problem Relation Age of Onset  . Hyperlipidemia Father   . Hypertension Father   . Heart failure Father   . Arthritis Maternal Grandmother     Social History   Socioeconomic History  . Marital status: Married    Spouse name: Not on file  .  Number of children: Not on file  . Years of education: Not on file  . Highest education level: Not on file  Occupational History  . Not on file  Tobacco Use  . Smoking status: Never Smoker  . Smokeless tobacco: Never Used  Substance and Sexual Activity  . Alcohol use: Yes    Alcohol/week: 0.0 standard drinks    Comment: social  . Drug use: No  . Sexual activity: Yes    Birth control/protection: None, Surgical    Comment: tubal ligation   Other Topics Concern  . Not on file  Social History Narrative  . Not on file   Social Determinants of Health   Financial Resource Strain: Not on file  Food Insecurity: Not on file  Transportation Needs: Not on file  Physical Activity: Not on file  Stress: Not on file  Social Connections: Not on file  Intimate Partner Violence: Not on file     Constitutional: Denies fever, malaise, fatigue, headache or abrupt weight changes.  Respiratory: Denies difficulty breathing, shortness of breath, cough or sputum production.   Cardiovascular: Denies chest pain, chest tightness, palpitations or swelling in the hands or feet.  Skin: Pt reports abscess to left axilla. Denies rashes or  ulcercations.    No other specific complaints in a complete review of systems (except as listed in HPI above).  Objective:   Physical Exam  BP 137/86 (BP Location: Right Arm, Patient Position: Sitting, Cuff Size: Large)   Pulse 70   Temp 97.8 F (36.6 C) (Temporal)   Resp 18   Ht 5\' 7"  (1.702 m)   Wt 274 lb 9.6 oz (124.6 kg)   SpO2 100%   BMI 43.01 kg/m   Wt Readings from Last 3 Encounters:  05/10/20 279 lb 12 oz (126.9 kg)  03/13/20 271 lb 9.6 oz (123.2 kg)  12/20/19 272 lb (123.4 kg)    General: Appears her stated age, obese, in NAD. Skin: 1 cm x 0.25 cm oval-shaped abscess noted in the central portion of the left axilla, fluctuant.  No drainage noted.  No surrounding cellulitis noted Cardiovascular: Normal rate. Pulmonary/Chest: Normal effort.   Neurological: Alert and oriented.  Psych: Mood and affect normal. Behavior normal. Judgement and thought content normal.   BMET    Component Value Date/Time   NA 140 07/08/2019 1633   NA 138 09/28/2017 1005   K 4.3 07/08/2019 1633   CL 105 07/08/2019 1633   CO2 24 07/08/2019 1633   GLUCOSE 84 07/08/2019 1633   BUN 12 07/08/2019 1633   BUN 12 09/28/2017 1005   CREATININE 0.90 07/08/2019 1633   CALCIUM 9.5 07/08/2019 1633   GFRNONAA 80 09/28/2017 1005   GFRAA 92 09/28/2017 1005    Lipid Panel     Component Value Date/Time   CHOL 185 09/28/2017 1005   TRIG 66 09/28/2017 1005   HDL 59 09/28/2017 1005   CHOLHDL 3.1 09/28/2017 1005   LDLCALC 113 (H) 09/28/2017 1005    CBC    Component Value Date/Time   WBC 11.1 (H) 07/08/2019 1633   RBC 5.23 (H) 07/08/2019 1633   HGB 10.6 (L) 07/08/2019 1633   HGB 10.4 (L) 09/28/2017 1005   HCT 36.3 07/08/2019 1633   HCT 34.0 09/28/2017 1005   PLT 293 07/08/2019 1633   PLT 294 09/28/2017 1005   MCV 69.4 (L) 07/08/2019 1633   MCV 68 (L) 09/28/2017 1005   MCH 20.3 (L) 07/08/2019 1633   MCHC 29.2 (L) 07/08/2019 1633   RDW 15.9 (H) 07/08/2019 1633   RDW 18.1 (H) 09/28/2017 1005   LYMPHSABS 2,930 07/08/2019 1633   EOSABS 289 07/08/2019 1633   BASOSABS 56 07/08/2019 1633    Hgb A1C Lab Results  Component Value Date   HGBA1C 5.6 05/25/2018          Assessment & Plan:   Abscess of Left Axilla:  She would like to avoid I&D if at all possible Encouraged her to continue warm compresses Advised to to avoid shaving until this resolves Rx for Septra DS 1 tab p.o. twice daily x10 days Advised her that if this does not improve we will need to perform an I&D  Return precautions discussed

## 2020-07-19 NOTE — Patient Instructions (Signed)
Skin Abscess  A skin abscess is an infected area of your skin that contains pus and other material. An abscess can happen in any part of your body. Some abscesses break open (rupture) on their own. Most continue to get worse unless they are treated. The infection can spread deeper into the body and into your blood, which can make you feel sick. A skin abscess is caused by germs that enter the skin through a cut or scrape. It can also be caused by blocked oil and sweat glands or infected hair follicles. This condition is usually treated by:  Draining the pus.  Taking antibiotic medicines.  Placing a warm, wet washcloth over the abscess. Follow these instructions at home: Medicines  Take over-the-counter and prescription medicines only as told by your doctor.  If you were prescribed an antibiotic medicine, take it as told by your doctor. Do not stop taking the antibiotic even if you start to feel better.   Abscess care  If you have an abscess that has not drained, place a warm, clean, wet washcloth over the abscess several times a day. Do this as told by your doctor.  Follow instructions from your doctor about how to take care of your abscess. Make sure you: ? Cover the abscess with a bandage (dressing). ? Change your bandage or gauze as told by your doctor. ? Wash your hands with soap and water before you change the bandage or gauze. If you cannot use soap and water, use hand sanitizer.  Check your abscess every day for signs that the infection is getting worse. Check for: ? More redness, swelling, or pain. ? More fluid or blood. ? Warmth. ? More pus or a bad smell.   General instructions  To avoid spreading the infection: ? Do not share personal care items, towels, or hot tubs with others. ? Avoid making skin-to-skin contact with other people.  Keep all follow-up visits as told by your doctor. This is important. Contact a doctor if:  You have more redness, swelling, or pain  around your abscess.  You have more fluid or blood coming from your abscess.  Your abscess feels warm when you touch it.  You have more pus or a bad smell coming from your abscess.  You have a fever.  Your muscles ache.  You have chills.  You feel sick. Get help right away if:  You have very bad (severe) pain.  You see red streaks on your skin spreading away from the abscess. Summary  A skin abscess is an infected area of your skin that contains pus and other material.  The abscess is caused by germs that enter the skin through a cut or scrape. It can also be caused by blocked oil and sweat glands or infected hair follicles.  Follow your doctor's instructions on caring for your abscess, taking medicines, preventing infections, and keeping follow-up visits. This information is not intended to replace advice given to you by your health care provider. Make sure you discuss any questions you have with your health care provider. Document Revised: 10/14/2018 Document Reviewed: 04/23/2017 Elsevier Patient Education  2021 [employer].

## 2020-07-21 NOTE — Progress Notes (Signed)
Subjective:   Patient ID: Gabriella Riley, female   DOB: 45 y.o.   MRN: [id number]   HPI 45 year old female presents the office today for evaluation of bilateral foot pain.  She said this been ongoing for years, least 2 years.  She states the right side worse than left.  She gets pain in the morning when she first gets up.  The left side is better with activity however the right side becoming more consistent.  She has pain mostly to the arch of the foot.  She has tried over-the-counter insert as well as using a frozen water bottle.  She denies any numbness or tingling.  No injuries.  No increase in swelling.  No other concerns.   Review of Systems  All other systems reviewed and are negative.  Past Medical History:  Diagnosis Date  . Arthritis   . GERD (gastroesophageal reflux disease)   . Hyperlipidemia   . Hypertension     Past Surgical History:  Procedure Laterality Date  . BREAST BIOPSY Left 2007  . BREAST EXCISIONAL BIOPSY    . TUBAL LIGATION       Current Outpatient Medications:  .  acetaminophen (TYLENOL) 500 MG tablet, Take 500-1,000 mg by mouth every 6 (six) hours as needed (for pain.)., Disp: , Rfl:  .  losartan (COZAAR) 25 MG tablet, Take 1 tablet (25 mg total) by mouth daily., Disp: 90 tablet, Rfl: 1 .  Multiple Vitamins-Minerals (ADULT GUMMY PO), Take 2 tablets by mouth daily., Disp: , Rfl:  .  sulfamethoxazole-trimethoprim (BACTRIM DS) 800-160 MG tablet, Take 1 tablet by mouth 2 (two) times daily., Disp: 20 tablet, Rfl: 0  Allergies  Allergen Reactions  . Latex Rash         Objective:  Physical Exam  General: AAO x3, NAD  Dermatological: Skin is warm, dry and supple bilateral.  There are no open sores, no preulcerative lesions, no rash or signs of infection present.  Vascular: Dorsalis Pedis artery and Posterior Tibial artery pedal pulses are 2/4 bilateral with immedate capillary fill time.  There is no pain with calf compression, swelling,  warmth, erythema.   Neruologic: Grossly intact via light touch bilateral.  Negative Tinel sign.  Musculoskeletal: Tenderness palpation on medial band plantar fashion the arch of the foot as well as along the insertion into the calcaneus.  There is no pain with all compression of calcaneus.  There is no pain with Achilles tendon.  No edema, erythema.  Muscular strength 5/5 in all groups tested bilateral.  Gait: Unassisted, Nonantalgic.       Assessment:   45 year old female with bilateral foot pain, plantar fasciitis right side worse than left     Plan:  -Treatment options discussed including all alternatives, risks, and complications -Etiology of symptoms were discussed -X-rays were obtained and reviewed with the patient.  No evidence of acute fracture or stress fracture identified today. -Discussed traction, icing daily.  Discussed shoe modifications and good arch supports.  Anti-inflammatories as needed.  I dispensed to night splint.  Steroid injection if needed.  Return in about 4 weeks (around 08/14/2020).  Trula Slade DPM

## 2020-08-14 ENCOUNTER — Ambulatory Visit: Payer: BC Managed Care – PPO | Admitting: Podiatry

## 2020-08-14 ENCOUNTER — Other Ambulatory Visit: Payer: Self-pay

## 2020-08-14 DIAGNOSIS — M722 Plantar fascial fibromatosis: Secondary | ICD-10-CM | POA: Diagnosis not present

## 2020-08-14 MED ORDER — TRIAMCINOLONE ACETONIDE 10 MG/ML IJ SUSP
10.0000 mg | Freq: Once | INTRAMUSCULAR | Status: AC
Start: 2020-08-14 — End: 2020-08-14
  Administered 2020-08-14: 10 mg

## 2020-08-14 NOTE — Patient Instructions (Signed)

## 2020-08-15 ENCOUNTER — Other Ambulatory Visit: Payer: Self-pay | Admitting: Podiatry

## 2020-08-21 NOTE — Progress Notes (Signed)
Subjective: 45 year old female presents the office today for follow-up evaluation of bilateral foot pain.  This is been ongoing for years at least 2 years.  She said the left foot is been doing okay but still in some discomfort on the right side.  She has been doing stretching, icing.  She been using a splint as well.  No recent injury or falls or changes otherwise. Denies any systemic complaints such as fevers, chills, nausea, vomiting. No acute changes since last appointment, and no other complaints at this time.   Objective: AAO x3, NAD DP/PT pulses palpable bilaterally, CRT less than 3 seconds There is still tenderness palpation on the plantar no tubercle of the calcaneus at the insertion of plantar fashion the right side.  No significant discomfort left side.  No pain about across the calcaneus.  There is no edema, erythema.  No pain with Achilles tendon. No pain with calf compression, swelling, warmth, erythema  Assessment: 45 year old female with plantar fasciitis  Plan: -All treatment options discussed with the patient including all alternatives, risks, complications.  -Discussed over injection of right foot today. -Continue stretching, icing daily peer discussion modifications and orthotics. -Patient encouraged to call the office with any questions, concerns, change in symptoms.

## 2020-09-20 ENCOUNTER — Ambulatory Visit (INDEPENDENT_AMBULATORY_CARE_PROVIDER_SITE_OTHER): Payer: BC Managed Care – PPO | Admitting: Family Medicine

## 2020-09-20 ENCOUNTER — Other Ambulatory Visit: Payer: Self-pay

## 2020-09-20 ENCOUNTER — Encounter (INDEPENDENT_AMBULATORY_CARE_PROVIDER_SITE_OTHER): Payer: Self-pay | Admitting: Family Medicine

## 2020-09-20 VITALS — BP 117/80 | HR 68 | Temp 98.6°F | Ht 67.0 in | Wt 270.0 lb

## 2020-09-20 DIAGNOSIS — E7800 Pure hypercholesterolemia, unspecified: Secondary | ICD-10-CM

## 2020-09-20 DIAGNOSIS — R0602 Shortness of breath: Secondary | ICD-10-CM | POA: Diagnosis not present

## 2020-09-20 DIAGNOSIS — R5383 Other fatigue: Secondary | ICD-10-CM | POA: Diagnosis not present

## 2020-09-20 DIAGNOSIS — Z6841 Body Mass Index (BMI) 40.0 and over, adult: Secondary | ICD-10-CM

## 2020-09-20 DIAGNOSIS — D509 Iron deficiency anemia, unspecified: Secondary | ICD-10-CM | POA: Diagnosis not present

## 2020-09-20 DIAGNOSIS — E559 Vitamin D deficiency, unspecified: Secondary | ICD-10-CM

## 2020-09-20 DIAGNOSIS — Z0289 Encounter for other administrative examinations: Secondary | ICD-10-CM

## 2020-09-20 DIAGNOSIS — Z9189 Other specified personal risk factors, not elsewhere classified: Secondary | ICD-10-CM | POA: Diagnosis not present

## 2020-09-20 DIAGNOSIS — Z1331 Encounter for screening for depression: Secondary | ICD-10-CM

## 2020-09-23 LAB — ANEMIA PANEL
Ferritin: 34 ng/mL (ref 15–150)
Folate, Hemolysate: 325 ng/mL
Folate, RBC: 817 ng/mL (ref >498)
Hematocrit: 39.8 % (ref 34.0–46.6)
Iron Saturation: 7 % — CL (ref 15–55)
Iron: 25 ug/dL — ABNORMAL LOW (ref 27–159)
Retic Ct Pct: 1.4 % (ref 0.6–2.6)
Total Iron Binding Capacity: 335 ug/dL (ref 250–450)
UIBC: 310 ug/dL (ref 131–425)
Vitamin B-12: 492 pg/mL (ref 232–1245)

## 2020-09-23 LAB — CBC WITH DIFFERENTIAL/PLATELET
Basophils Absolute: 0 10*3/uL (ref 0.0–0.2)
Basos: 0 %
EOS (ABSOLUTE): 0.3 10*3/uL (ref 0.0–0.4)
Eos: 4 %
Hemoglobin: 11.7 g/dL (ref 11.1–15.9)
Immature Grans (Abs): 0 10*3/uL (ref 0.0–0.1)
Immature Granulocytes: 0 %
Lymphocytes Absolute: 2 10*3/uL (ref 0.7–3.1)
Lymphs: 21 %
MCH: 21.2 pg — ABNORMAL LOW (ref 26.6–33.0)
MCHC: 29.4 g/dL — ABNORMAL LOW (ref 31.5–35.7)
MCV: 72 fL — ABNORMAL LOW (ref 79–97)
Monocytes Absolute: 0.5 10*3/uL (ref 0.1–0.9)
Monocytes: 5 %
Neutrophils Absolute: 6.4 10*3/uL (ref 1.4–7.0)
Neutrophils: 70 %
Platelets: 335 10*3/uL (ref 150–450)
RBC: 5.52 x10E6/uL — ABNORMAL HIGH (ref 3.77–5.28)
RDW: 18.1 % — ABNORMAL HIGH (ref 11.7–15.4)
WBC: 9.3 10*3/uL (ref 3.4–10.8)

## 2020-09-23 LAB — COMPREHENSIVE METABOLIC PANEL
ALT: 12 IU/L (ref 0–32)
AST: 13 IU/L (ref 0–40)
Albumin/Globulin Ratio: 1.5 (ref 1.2–2.2)
Albumin: 3.9 g/dL (ref 3.8–4.8)
Alkaline Phosphatase: 89 IU/L (ref 44–121)
BUN/Creatinine Ratio: 12 (ref 9–23)
BUN: 11 mg/dL (ref 6–24)
Bilirubin Total: 0.4 mg/dL (ref 0.0–1.2)
CO2: 24 mmol/L (ref 20–29)
Calcium: 9 mg/dL (ref 8.7–10.2)
Chloride: 106 mmol/L (ref 96–106)
Creatinine, Ser: 0.95 mg/dL (ref 0.57–1.00)
Globulin, Total: 2.6 g/dL (ref 1.5–4.5)
Glucose: 88 mg/dL (ref 65–99)
Potassium: 4.4 mmol/L (ref 3.5–5.2)
Sodium: 140 mmol/L (ref 134–144)
Total Protein: 6.5 g/dL (ref 6.0–8.5)
eGFR: 76 mL/min/{1.73_m2} (ref >59)

## 2020-09-23 LAB — T4: T4, Total: 8.9 ug/dL (ref 4.5–12.0)

## 2020-09-23 LAB — LIPID PANEL WITH LDL/HDL RATIO
Cholesterol, Total: 186 mg/dL (ref 100–199)
HDL: 47 mg/dL (ref >39)
LDL Chol Calc (NIH): 127 mg/dL — ABNORMAL HIGH (ref 0–99)
LDL/HDL Ratio: 2.7 ratio (ref 0.0–3.2)
Triglycerides: 66 mg/dL (ref 0–149)
VLDL Cholesterol Cal: 12 mg/dL (ref 5–40)

## 2020-09-23 LAB — TSH: TSH: 0.965 u[IU]/mL (ref 0.450–4.500)

## 2020-09-23 LAB — INSULIN, RANDOM: INSULIN: 11.1 u[IU]/mL (ref 2.6–24.9)

## 2020-09-23 LAB — T3: T3, Total: 131 ng/dL (ref 71–180)

## 2020-09-23 LAB — VITAMIN D 25 HYDROXY (VIT D DEFICIENCY, FRACTURES): Vit D, 25-Hydroxy: 16.9 ng/mL — ABNORMAL LOW (ref 30.0–100.0)

## 2020-09-23 LAB — HEMOGLOBIN A1C
Est. average glucose Bld gHb Est-mCnc: 123 mg/dL
Hgb A1c MFr Bld: 5.9 % — ABNORMAL HIGH (ref 4.8–5.6)

## 2020-09-23 LAB — FOLATE: Folate: 3.7 ng/mL (ref >3.0)

## 2020-09-27 ENCOUNTER — Ambulatory Visit: Payer: BC Managed Care – PPO | Admitting: Podiatry

## 2020-09-27 NOTE — Progress Notes (Signed)
Dear Dr. Elsie Riley,   Thank you for referring Gabriella Riley to our clinic. The following note includes my evaluation and treatment recommendations.  Chief Complaint:   OBESITY Gabriella Riley (MR# [id number]) is a 45 y.o. female who presents for evaluation and treatment of obesity and related comorbidities. Current BMI is Body mass index is 42.29 kg/m. Gabriella Riley has been struggling with her weight for many years and has been unsuccessful in either losing weight, maintaining weight loss, or reaching her healthy weight goal.  Gabriella Riley is currently in the action stage of change and ready to dedicate time achieving and maintaining a healthier weight. Gabriella Riley is interested in becoming our patient and working on intensive lifestyle modifications including (but not limited to) diet and exercise for weight loss.  Gabriella Riley was referred by Gabriella Riley. She reports some lactose intolerance- can tolerate cheese. Steady weight gain over 20 years then increased 35-40 lbs in 06-29-2017 after her dad died. She skips breakfast daily. Water in the morning. 11 AM Lunch- protein shake or [employer] chicken rotisserie 6 inch with vegetables with [place] onion sauce, +/- soda (satisfied); Snack- chips or peanuts (individual bag) (hungry); Dinner- 4 oz fried pork chops, 1 corn on the cob, 1 cup mashed potatoes or 10 oz [place] ribeye steak with loaded baked potato and strawberry pecan salad.  Gabriella Riley's habits were reviewed today and are as follows: her desired weight loss is 90 lbs, she started gaining weight in 06/29/17 after her father passed away, her heaviest weight ever was 278 pounds, she has significant food cravings issues, she snacks frequently in the evenings, she skips meals frequently, she is frequently drinking liquids with calories, she frequently makes poor food choices, she frequently eats larger portions than normal, she has binge eating behaviors, and she struggles with emotional  eating.  Depression Screen Gabriella Riley's Food and Mood (modified PHQ-9) score was 16.  Depression screen Gabriella Riley 2/9 09/20/2020  Decreased Interest 3  Down, Depressed, Hopeless 1  PHQ - 2 Score 4  Altered sleeping 3  Tired, decreased energy 3  Change in appetite 3  Feeling bad or failure about yourself  2  Trouble concentrating 0  Moving slowly or fidgety/restless 1  Suicidal thoughts 0  PHQ-9 Score 16  Difficult doing work/chores Not difficult at all   Subjective:   1. Other fatigue Gabriella Riley admits to daytime somnolence and admits to waking up still tired. Patent has a history of symptoms of daytime fatigue, morning fatigue, and morning headache. Gabriella Riley generally gets 6 hours of sleep per night, and states that she has poor sleep quality. Snoring is present. Apneic episodes are not present. Epworth Sleepiness Score is 9. EKG normal sinus rhythm at 62 bpm.  2. Shortness of breath on exertion Gabriella Riley notes increasing shortness of breath with exercising and seems to be worsening over time with weight gain. She notes getting out of breath sooner with activity than she used to. This has gotten worse recently. Gabriella Riley denies shortness of breath at rest or orthopnea. EKG normal sinus rhythm at 62 bpm.  3. Iron deficiency anemia, unspecified iron deficiency anemia type Gabriella Riley MCV was previously 69. Her hemoglobin and hematocrit were normal on last check.  4. Pure hypercholesterolemia Gabriella Riley LDL was slightly elevated at 113. HDL was WNL.  5. Vitamin D deficiency Gabriella Riley Vit D level was previously 11.3. She reports fatigue. She is not on Vit D supplementation.  6. At risk for osteoporosis Gabriella Riley is at higher risk of osteopenia and  osteoporosis due to Vitamin D deficiency.   Assessment/Plan:   1. Other fatigue Gabriella Riley does feel that her weight is causing her energy to be lower than it should be. Fatigue may be related to obesity, depression or many other causes. Labs will be ordered, and in the  meanwhile, Gabriella Riley will focus on self care including making healthy food choices, increasing physical activity and focusing on stress reduction. Check labs today.  - Vitamin B12 - Comprehensive metabolic panel - EKG 85-IDPO - Hemoglobin A1c - Insulin, random - T4 - T3 - TSH  2. Shortness of breath on exertion Gabriella Riley does feel that she gets out of breath more easily that she used to when she exercises. Gabriella Riley's shortness of breath appears to be obesity related and exercise induced. She has agreed to work on weight loss and gradually increase exercise to treat her exercise induced shortness of breath. Will continue to monitor closely.  3. Iron deficiency anemia, unspecified iron deficiency anemia type Check labs today.  - CBC with Differential/Platelet - Folate - Anemia panel  4. Pure hypercholesterolemia Cardiovascular risk and specific lipid/LDL goals reviewed.  We discussed several lifestyle modifications today and Gabriella Riley will continue to work on diet, exercise and weight loss efforts. Orders and follow up as documented in patient record.   Counseling Intensive lifestyle modifications are the first line treatment for this issue. Dietary changes: Increase soluble fiber. Decrease simple carbohydrates. Exercise changes: Moderate to vigorous-intensity aerobic activity 150 minutes per week if tolerated. Lipid-lowering medications: see documented in medical record. Check labs today.  - Lipid Panel With LDL/HDL Ratio  5. Vitamin D deficiency Low Vitamin D level contributes to fatigue and are associated with obesity, breast, and colon cancer. She agrees to follow-up for routine testing of Vitamin D, at least 2-3 times per year to avoid over-replacement. Check labs today.  - VITAMIN D 25 Hydroxy (Vit-D Deficiency, Fractures)  6. Screening for depression Gabriella Riley had a positive depression screening. Depression is commonly associated with obesity and often results in emotional eating  behaviors. We will monitor this closely and work on CBT to help improve the non-hunger eating patterns. Referral to Psychology may be required if no improvement is seen as she continues in our clinic.  7. At risk for osteoporosis Gabriella Riley was given approximately 15 minutes of osteoporosis prevention counseling today. Gabriella Riley is at risk for osteopenia and osteoporosis due to her Vitamin D deficiency. She was encouraged to take her Vitamin D and follow her higher calcium diet and increase strengthening exercise to help strengthen her bones and decrease her risk of osteopenia and osteoporosis.  Repetitive spaced learning was employed today to elicit superior memory formation and behavioral change.  8. Class 3 severe obesity with serious comorbidity and body mass index (BMI) of 40.0 to 44.9 in adult, unspecified obesity type (HCC)  Gabriella Riley is currently in the action stage of change and her goal is to continue with weight loss efforts. I recommend Gabriella Riley begin the structured treatment plan as follows:  She has agreed to the Category 3 Plan.  Exercise goals: No exercise has been prescribed at this time.   Behavioral modification strategies: increasing lean protein intake, meal planning and cooking strategies, keeping healthy foods in the home, dealing with family or coworker sabotage, and planning for success.  She was informed of the importance of frequent follow-up visits to maximize her success with intensive lifestyle modifications for her multiple health conditions. She was informed we would discuss her lab results at her next  visit unless there is a critical issue that needs to be addressed sooner. Gabriella Riley agreed to keep her next visit at the agreed upon time to discuss these results.  Objective:   Blood pressure 117/80, pulse 68, temperature 98.6 F (37 C), height 5\' 7"  (1.702 m), weight 270 lb (122.5 kg), SpO2 96 %. Body mass index is 42.29 kg/m.  EKG: Normal sinus rhythm, rate 62.  Indirect  Calorimeter completed today shows a VO2 of 290 and a REE of 2020.  Her calculated basal metabolic rate is 4128 thus her basal metabolic rate is worse than expected.  General: Cooperative, alert, well developed, in no acute distress. HEENT: Conjunctivae and lids unremarkable. Cardiovascular: Regular rhythm.  Lungs: Normal work of breathing. Neurologic: No focal deficits.   Lab Results  Component Value Date   CREATININE 0.95 09/20/2020   BUN 11 09/20/2020   NA 140 09/20/2020   K 4.4 09/20/2020   CL 106 09/20/2020   CO2 24 09/20/2020   Lab Results  Component Value Date   ALT 12 09/20/2020   AST 13 09/20/2020   ALKPHOS 89 09/20/2020   BILITOT 0.4 09/20/2020   Lab Results  Component Value Date   HGBA1C 5.9 (H) 09/20/2020   HGBA1C 5.6 05/25/2018   Lab Results  Component Value Date   INSULIN 11.1 09/20/2020   Lab Results  Component Value Date   TSH 0.965 09/20/2020   Lab Results  Component Value Date   CHOL 186 09/20/2020   HDL 47 09/20/2020   LDLCALC 127 (H) 09/20/2020   TRIG 66 09/20/2020   CHOLHDL 3.1 09/28/2017   Lab Results  Component Value Date   WBC 9.3 09/20/2020   HGB 11.7 09/20/2020   HCT 39.8 09/20/2020   MCV 72 (L) 09/20/2020   PLT 335 09/20/2020   Lab Results  Component Value Date   IRON 25 (L) 09/20/2020   TIBC 335 09/20/2020   FERRITIN 34 09/20/2020    Attestation Statements:   Reviewed by clinician on day of visit: allergies, medications, problem list, medical history, surgical history, family history, social history, and previous encounter notes.  Coral Ceo, CMA, am acting as transcriptionist for Coralie Common, MD.   This is the patient's first visit at [facility]. The patient's NEW PATIENT PACKET was reviewed at length. Included in the packet: current and past health history, medications, allergies, ROS, gynecologic history (women only), surgical history, family history, social history, weight history, weight  loss surgery history (for those that have had weight loss surgery), nutritional evaluation, mood and food questionnaire, PHQ9, Epworth questionnaire, sleep habits questionnaire, patient life and health improvement goals questionnaire. These will all be scanned into the patient's chart under media.   During the visit, I independently reviewed the patient's EKG, bioimpedance scale results, and indirect calorimeter results. I used this information to tailor a meal plan for the patient that will help her to lose weight and will improve her obesity-related conditions going forward. I performed a medically necessary appropriate examination and/or evaluation. I discussed the assessment and treatment plan with the patient. The patient was provided an opportunity to ask questions and all were answered. The patient agreed with the plan and demonstrated an understanding of the instructions. Labs were ordered at this visit and will be reviewed at the next visit unless more critical results need to be addressed immediately. Clinical information was updated and documented in the EMR.   Time spent on visit including pre-visit chart review and post-visit care  was 45 minutes.   A separate 15 minutes was spent on risk counseling (see above).  I have reviewed the above documentation for accuracy and completeness, and I agree with the above. - Jinny Blossom, MD

## 2020-10-04 ENCOUNTER — Other Ambulatory Visit: Payer: Self-pay

## 2020-10-04 ENCOUNTER — Ambulatory Visit (INDEPENDENT_AMBULATORY_CARE_PROVIDER_SITE_OTHER): Payer: BC Managed Care – PPO | Admitting: Family Medicine

## 2020-10-04 ENCOUNTER — Encounter (INDEPENDENT_AMBULATORY_CARE_PROVIDER_SITE_OTHER): Payer: Self-pay | Admitting: Family Medicine

## 2020-10-04 VITALS — BP 140/97 | HR 69 | Temp 98.5°F | Ht 67.0 in | Wt 273.0 lb

## 2020-10-04 DIAGNOSIS — R7303 Prediabetes: Secondary | ICD-10-CM | POA: Diagnosis not present

## 2020-10-04 DIAGNOSIS — E559 Vitamin D deficiency, unspecified: Secondary | ICD-10-CM

## 2020-10-04 DIAGNOSIS — Z9189 Other specified personal risk factors, not elsewhere classified: Secondary | ICD-10-CM | POA: Diagnosis not present

## 2020-10-04 DIAGNOSIS — E7800 Pure hypercholesterolemia, unspecified: Secondary | ICD-10-CM | POA: Diagnosis not present

## 2020-10-04 DIAGNOSIS — Z6841 Body Mass Index (BMI) 40.0 and over, adult: Secondary | ICD-10-CM

## 2020-10-04 DIAGNOSIS — I10 Essential (primary) hypertension: Secondary | ICD-10-CM

## 2020-10-04 DIAGNOSIS — D509 Iron deficiency anemia, unspecified: Secondary | ICD-10-CM | POA: Diagnosis not present

## 2020-10-04 MED ORDER — VITAMIN D (ERGOCALCIFEROL) 1.25 MG (50000 UNIT) PO CAPS: 50000.0000 [IU] | ORAL_CAPSULE | ORAL | 0 refills | Status: DC

## 2020-10-05 ENCOUNTER — Encounter (INDEPENDENT_AMBULATORY_CARE_PROVIDER_SITE_OTHER): Payer: Self-pay | Admitting: Family Medicine

## 2020-10-08 ENCOUNTER — Ambulatory Visit: Payer: BC Managed Care – PPO | Admitting: Podiatry

## 2020-10-08 NOTE — Telephone Encounter (Signed)
Last OV with Dr Ukleja 

## 2020-10-11 NOTE — Progress Notes (Signed)
Chief Complaint:   OBESITY Gabriella Riley is here to discuss her progress with her obesity treatment plan along with follow-up of her obesity related diagnoses. Gabriella Riley is on the Category 3 Plan and states she is following her eating plan approximately 60-70% of the time. Gabriella Riley states she is walking 30 minutes 4 times per week.  Today's visit was #: 2 Starting weight: 270 lbs Starting date: 09/20/2020 Today's weight: 273 lbs Today's date: 10/04/2020 Total lbs lost to date: 0 Total lbs lost since last in-office visit: 0  Interim History: Gabriella Riley did well the first few days and then noticed an increase in sweets cravings. She is craving Snickers or Chips Ahoy. For the 4th of July, she went to the mountains. She is going on a cruise August 1. Doing egg option for breakfast and sandwich option for lunch. Dinner is a Scientist, research (physical sciences) and vegetable.  Subjective:   1. Iron deficiency anemia, unspecified iron deficiency anemia type Discussed labs with patient today. Makinley's iron is low, ferritin is low but in range. RDW is elevated and MCV is low. She has history of iron deficiency.  2. Pre-diabetes New. Discussed labs with patient today. Her HgA1c is 5.9 and insulin level 11.1. She is not on medication.  3. Pure hypercholesterolemia Discussed labs with patient today. Gabriella Riley has an LDL of 127, HDL 47, and triglycerides 66. She is not on a statin.  4. Vitamin D deficiency Discussed labs with patient today. She has a Vit D level of 16.9. Pt reports fatigue. She is not on individual supplement.  5. At risk for diabetes mellitus Gabriella Riley is at higher than average risk for developing diabetes due to obesity.   Assessment/Plan:   1. Iron deficiency anemia, unspecified iron deficiency anemia type Pt is to transition to prenatal vitamin and recheck anemia panel in 3 months.  2. Pre-diabetes Gabriella Riley will continue to work on weight loss, exercise, and decreasing simple carbohydrates to help decrease the risk  of diabetes. Repeat labs in 3 months. If cravings increase or hunger increases, consider medication.  3. Pure hypercholesterolemia Cardiovascular risk and specific lipid/LDL goals reviewed.  We discussed several lifestyle modifications today and Gabriella Riley will continue to work on diet, exercise and weight loss efforts. Orders and follow up as documented in patient record. Repeat labs in 3 months.  Counseling Intensive lifestyle modifications are the first line treatment for this issue. Dietary changes: Increase soluble fiber. Decrease simple carbohydrates. Exercise changes: Moderate to vigorous-intensity aerobic activity 150 minutes per week if tolerated. Lipid-lowering medications: see documented in medical record.  4. Vitamin D deficiency Low Vitamin D level contributes to fatigue and are associated with obesity, breast, and colon cancer. She agrees to start to take prescription Vitamin D @50 ,000 IU every week and will follow-up for routine testing of Vitamin D, at least 2-3 times per year to avoid over-replacement.  Start- Vitamin D, Ergocalciferol, (DRISDOL) 1.25 MG (50000 UNIT) CAPS capsule; Take 1 capsule (50,000 Units total) by mouth every 7 (seven) days.  Dispense: 4 capsule; Refill: 0  5. At risk for diabetes mellitus Gabriella Riley was given approximately 30 minutes of diabetes education and counseling today. We discussed intensive lifestyle modifications today with an emphasis on weight loss as well as increasing exercise and decreasing simple carbohydrates in her diet. We also reviewed medication options with an emphasis on risk versus benefit of those discussed.   Repetitive spaced learning was employed today to elicit superior memory formation and behavioral change.   6. Class 3  severe obesity with serious comorbidity and body mass index (BMI) of 40.0 to 44.9 in adult, unspecified obesity type (HCC)  Gabriella Riley is currently in the action stage of change. As such, her goal is to continue with  weight loss efforts. She has agreed to the Category 3 Plan.   Exercise goals: No exercise has been prescribed at this time.  Behavioral modification strategies: increasing lean protein intake, meal planning and cooking strategies, keeping healthy foods in the home, and planning for success.  Gabriella Riley has agreed to follow-up with our clinic in 2 weeks. She was informed of the importance of frequent follow-up visits to maximize her success with intensive lifestyle modifications for her multiple health conditions.   Objective:   Blood pressure (!) 140/97, pulse 69, temperature 98.5 F (36.9 C), height 5\' 7"  (1.702 m), weight 273 lb (123.8 kg), SpO2 99 %. Body mass index is 42.76 kg/m.  General: Cooperative, alert, well developed, in no acute distress. HEENT: Conjunctivae and lids unremarkable. Cardiovascular: Regular rhythm.  Lungs: Normal work of breathing. Neurologic: No focal deficits.   Lab Results  Component Value Date   CREATININE 0.95 09/20/2020   BUN 11 09/20/2020   NA 140 09/20/2020   K 4.4 09/20/2020   CL 106 09/20/2020   CO2 24 09/20/2020   Lab Results  Component Value Date   ALT 12 09/20/2020   AST 13 09/20/2020   ALKPHOS 89 09/20/2020   BILITOT 0.4 09/20/2020   Lab Results  Component Value Date   HGBA1C 5.9 (H) 09/20/2020   HGBA1C 5.6 05/25/2018   Lab Results  Component Value Date   INSULIN 11.1 09/20/2020   Lab Results  Component Value Date   TSH 0.965 09/20/2020   Lab Results  Component Value Date   CHOL 186 09/20/2020   HDL 47 09/20/2020   LDLCALC 127 (H) 09/20/2020   TRIG 66 09/20/2020   CHOLHDL 3.1 09/28/2017   Lab Results  Component Value Date   VD25OH 16.9 (L) 09/20/2020   VD25OH 11.3 (L) 09/28/2017   Lab Results  Component Value Date   WBC 9.3 09/20/2020   HGB 11.7 09/20/2020   HCT 39.8 09/20/2020   MCV 72 (L) 09/20/2020   PLT 335 09/20/2020   Lab Results  Component Value Date   IRON 25 (L) 09/20/2020   TIBC 335 09/20/2020    FERRITIN 34 09/20/2020    Attestation Statements:   Reviewed by clinician on day of visit: allergies, medications, problem list, medical history, surgical history, family history, social history, and previous encounter notes.  Coral Ceo, CMA, am acting as transcriptionist for Coralie Common, MD.   I have reviewed the above documentation for accuracy and completeness, and I agree with the above. - Coralie Common, MD

## 2020-10-18 ENCOUNTER — Other Ambulatory Visit: Payer: Self-pay

## 2020-10-18 ENCOUNTER — Ambulatory Visit (INDEPENDENT_AMBULATORY_CARE_PROVIDER_SITE_OTHER): Payer: BC Managed Care – PPO | Admitting: Family Medicine

## 2020-10-18 ENCOUNTER — Encounter (INDEPENDENT_AMBULATORY_CARE_PROVIDER_SITE_OTHER): Payer: Self-pay | Admitting: Family Medicine

## 2020-10-18 VITALS — BP 130/87 | HR 68 | Temp 98.6°F | Ht 67.0 in | Wt 269.0 lb

## 2020-10-18 DIAGNOSIS — E559 Vitamin D deficiency, unspecified: Secondary | ICD-10-CM | POA: Diagnosis not present

## 2020-10-18 DIAGNOSIS — R7303 Prediabetes: Secondary | ICD-10-CM

## 2020-10-18 DIAGNOSIS — Z9189 Other specified personal risk factors, not elsewhere classified: Secondary | ICD-10-CM

## 2020-10-18 DIAGNOSIS — Z6841 Body Mass Index (BMI) 40.0 and over, adult: Secondary | ICD-10-CM | POA: Diagnosis not present

## 2020-10-18 MED ORDER — VITAMIN D (ERGOCALCIFEROL) 1.25 MG (50000 UNIT) PO CAPS: 50000.0000 [IU] | ORAL_CAPSULE | ORAL | 0 refills | Status: DC

## 2020-10-18 MED ORDER — TIRZEPATIDE 2.5 MG/0.5ML ~~LOC~~ SOAJ: 2.5000 mg | SUBCUTANEOUS | 0 refills | Status: DC

## 2020-10-22 ENCOUNTER — Encounter (INDEPENDENT_AMBULATORY_CARE_PROVIDER_SITE_OTHER): Payer: Self-pay

## 2020-10-22 NOTE — Progress Notes (Signed)
Chief Complaint:   OBESITY Gabriella Riley is here to discuss her progress with her obesity treatment plan along with follow-up of her obesity related diagnoses. Gabriella Riley is on the Category 3 Plan and states she is following her eating plan approximately 50% of the time. Gabriella Riley states she is walking 30 minutes 6-7 times per week.  Today's visit was #: 3 Starting weight: 270 lbs Starting date: 09/20/2020 Today's weight: 269 lbs Today's date: 10/18/2020 Total lbs lost to date: 1 Total lbs lost since last in-office visit: 4  Interim History: Gabriella Riley felt the meal plan went well. The first week was on point with meal plan. Then husband and pt got into an argument and she voices she was overeating that second week. She is going on a cruise next week for 1 week to [place]. Pt was interested in pharmacotherapy assistance in weight loss.  Subjective:   1. Pre-diabetes Gabriella Riley has an IUD and tubal ligation. Her last A1c was 5.9. She reports an increased drive to eat carbs.  2. Vitamin D deficiency Gabriella Riley denies nausea, vomiting, and muscle weakness but notes fatigue. She is on prescription Vit D.  3. At risk for adverse drug event Gabriella Riley is at risk for medication side effects due to starting [facility].  Assessment/Plan:   1. Pre-diabetes Gabriella Riley will start Mounjaro 2.5 mg. She will also continue to work on weight loss, exercise, and decreasing simple carbohydrates to help decrease the risk of diabetes.   Start- tirzepatide [facility]) 2.5 MG/0.5ML Pen; Inject 2.5 mg into the skin once a week.  Dispense: 2 mL; Refill: 0  2. Vitamin D deficiency Low Vitamin D level contributes to fatigue and are associated with obesity, breast, and colon cancer. She agrees to continue to take prescription Vitamin D '@50'$ ,000 IU every week and will follow-up for routine testing of Vitamin D, at least 2-3 times per year to avoid over-replacement.  Refill- Vitamin D, Ergocalciferol, (DRISDOL) 1.25 MG (50000 UNIT) CAPS  capsule; Take 1 capsule (50,000 Units total) by mouth every 7 (seven) days.  Dispense: 4 capsule; Refill: 0  3. At risk for adverse drug event Gabriella Riley was given approximately 15 minutes of drug side effect counseling today.  We discussed side effect possibility and risk versus benefits. Gabriella Riley agreed to the medication and will contact this office if these side effects are intolerable.  Repetitive spaced learning was employed today to elicit superior memory formation and behavioral change.   4. Obesity with current BMI of 42.3  Gabriella Riley is currently in the action stage of change. As such, her goal is to continue with weight loss efforts. She has agreed to the Category 3 Plan.   Exercise goals: All adults should avoid inactivity. Some physical activity is better than none, and adults who participate in any amount of physical activity gain some health benefits.  Behavioral modification strategies: increasing lean protein intake, meal planning and cooking strategies, keeping healthy foods in the home, and planning for success.  Gabriella Riley has agreed to follow-up with our clinic in 2-3 weeks. She was informed of the importance of frequent follow-up visits to maximize her success with intensive lifestyle modifications for her multiple health conditions.   Objective:   Blood pressure 130/87, pulse 68, temperature 98.6 F (37 C), height '5\' 7"'$  (1.702 m), weight 269 lb (122 kg), last menstrual period 10/11/2020, SpO2 100 %. Body mass index is 42.13 kg/m.  General: Cooperative, alert, well developed, in no acute distress. HEENT: Conjunctivae and lids unremarkable. Cardiovascular: Regular rhythm.  Lungs: Normal work of breathing. Neurologic: No focal deficits.   Lab Results  Component Value Date   CREATININE 0.95 09/20/2020   BUN 11 09/20/2020   NA 140 09/20/2020   K 4.4 09/20/2020   CL 106 09/20/2020   CO2 24 09/20/2020   Lab Results  Component Value Date   ALT 12 09/20/2020   AST 13  09/20/2020   ALKPHOS 89 09/20/2020   BILITOT 0.4 09/20/2020   Lab Results  Component Value Date   HGBA1C 5.9 (H) 09/20/2020   HGBA1C 5.6 05/25/2018   Lab Results  Component Value Date   INSULIN 11.1 09/20/2020   Lab Results  Component Value Date   TSH 0.965 09/20/2020   Lab Results  Component Value Date   CHOL 186 09/20/2020   HDL 47 09/20/2020   LDLCALC 127 (H) 09/20/2020   TRIG 66 09/20/2020   CHOLHDL 3.1 09/28/2017   Lab Results  Component Value Date   VD25OH 16.9 (L) 09/20/2020   VD25OH 11.3 (L) 09/28/2017   Lab Results  Component Value Date   WBC 9.3 09/20/2020   HGB 11.7 09/20/2020   HCT 39.8 09/20/2020   MCV 72 (L) 09/20/2020   PLT 335 09/20/2020   Lab Results  Component Value Date   IRON 25 (L) 09/20/2020   TIBC 335 09/20/2020   FERRITIN 34 09/20/2020   Attestation Statements:   Reviewed by clinician on day of visit: allergies, medications, problem list, medical history, surgical history, family history, social history, and previous encounter notes.  Coral Ceo, CMA, am acting as transcriptionist for Coralie Common, MD.  I have reviewed the above documentation for accuracy and completeness, and I agree with the above. - Coralie Common, MD

## 2020-10-25 ENCOUNTER — Other Ambulatory Visit (INDEPENDENT_AMBULATORY_CARE_PROVIDER_SITE_OTHER): Payer: Self-pay | Admitting: Family Medicine

## 2020-10-25 DIAGNOSIS — E559 Vitamin D deficiency, unspecified: Secondary | ICD-10-CM

## 2020-11-01 ENCOUNTER — Ambulatory Visit (INDEPENDENT_AMBULATORY_CARE_PROVIDER_SITE_OTHER): Payer: BC Managed Care – PPO | Admitting: Family Medicine

## 2020-11-06 ENCOUNTER — Ambulatory Visit (INDEPENDENT_AMBULATORY_CARE_PROVIDER_SITE_OTHER): Payer: BC Managed Care – PPO | Admitting: Family Medicine

## 2020-11-08 ENCOUNTER — Other Ambulatory Visit: Payer: Self-pay

## 2020-11-08 ENCOUNTER — Encounter (INDEPENDENT_AMBULATORY_CARE_PROVIDER_SITE_OTHER): Payer: Self-pay | Admitting: Family Medicine

## 2020-11-08 ENCOUNTER — Ambulatory Visit (INDEPENDENT_AMBULATORY_CARE_PROVIDER_SITE_OTHER): Payer: BC Managed Care – PPO | Admitting: Family Medicine

## 2020-11-08 VITALS — BP 140/83 | HR 78 | Temp 98.1°F | Ht 67.0 in | Wt 273.0 lb

## 2020-11-08 DIAGNOSIS — R7303 Prediabetes: Secondary | ICD-10-CM | POA: Diagnosis not present

## 2020-11-08 DIAGNOSIS — Z6841 Body Mass Index (BMI) 40.0 and over, adult: Secondary | ICD-10-CM

## 2020-11-08 NOTE — Progress Notes (Signed)
Chief Complaint:   OBESITY Gabriella is here to discuss her progress with her obesity treatment plan along with follow-up of her obesity related diagnoses. [name] is on the Category 3 Plan and states she is following her eating plan approximately 70% of the time. [name] states she is doing 0 minutes 0 times per week.  Today's visit was #: 4 Starting weight: 270 lbs Starting date: 09/20/2020 Today's weight: 273 lbs Today's date: 11/08/2020 Total lbs lost to date: 0 Total lbs lost since last in-office visit: 0  Interim History: Since Gabriella Riley's [place] Cruise, she has not been able to get back on plan. She notes quite a bit of hunger throughout the day and sweets cravings. She denies intake of sugar sweetened beverages. She plans on starting plan on Monday. She feels lunch and breakfast are doable and she plans on meal prepping.   Subjective:   1. Prediabetes Gabriella Riley's last A1C level was 5.9. She notes polyphagia. She has not started [facility] yet because she did not know to use the coupon.  Lab Results  Component Value Date   HGBA1C 5.9 (H) 09/20/2020   Lab Results  Component Value Date   INSULIN 11.1 09/20/2020    Assessment/Plan:   1. Prediabetes Gabriella Riley will continue to work on weight loss, exercise, and decreasing simple carbohydrates to help decrease the risk of diabetes.  She will start Mounjaro. A coupon was provided.  2. Obesity with current BMI of 42.75 Gabriella Riley is currently in the action stage of change. As such, her goal is to continue with weight loss efforts. She has agreed to the Category 3 Plan.   Exercise goals: No exercise has been prescribed at this time.  Behavioral modification strategies: increasing lean protein intake, decreasing simple carbohydrates, and meal planning and cooking strategies.  Gabriella Riley has agreed to follow-up with our clinic in 2-3 weeks.  Objective:   Blood pressure 140/83, pulse 78, temperature 98.1 F (36.7 C), height '5\' 7"'$  (1.702 m),  weight 273 lb (123.8 kg), last menstrual period 10/11/2020, SpO2 99 %. Body mass index is 42.76 kg/m.  General: Cooperative, alert, well developed, in no acute distress. HEENT: Conjunctivae and lids unremarkable. Cardiovascular: Regular rhythm.  Lungs: Normal work of breathing. Neurologic: No focal deficits.   Lab Results  Component Value Date   CREATININE 0.95 09/20/2020   BUN 11 09/20/2020   NA 140 09/20/2020   K 4.4 09/20/2020   CL 106 09/20/2020   CO2 24 09/20/2020   Lab Results  Component Value Date   ALT 12 09/20/2020   AST 13 09/20/2020   ALKPHOS 89 09/20/2020   BILITOT 0.4 09/20/2020   Lab Results  Component Value Date   HGBA1C 5.9 (H) 09/20/2020   HGBA1C 5.6 05/25/2018   Lab Results  Component Value Date   INSULIN 11.1 09/20/2020   Lab Results  Component Value Date   TSH 0.965 09/20/2020   Lab Results  Component Value Date   CHOL 186 09/20/2020   HDL 47 09/20/2020   LDLCALC 127 (H) 09/20/2020   TRIG 66 09/20/2020   CHOLHDL 3.1 09/28/2017   Lab Results  Component Value Date   VD25OH 16.9 (L) 09/20/2020   VD25OH 11.3 (L) 09/28/2017   Lab Results  Component Value Date   WBC 9.3 09/20/2020   HGB 11.7 09/20/2020   HCT 39.8 09/20/2020   MCV 72 (L) 09/20/2020   PLT 335 09/20/2020   Lab Results  Component Value Date   IRON 25 (L) 09/20/2020  TIBC 335 09/20/2020   FERRITIN 34 09/20/2020   Attestation Statements:   Reviewed by clinician on day of visit: allergies, medications, problem list, medical history, surgical history, family history, social history, and previous encounter notes.  I, Lizbeth Bark, RMA, am acting as Location manager for Gabriella Riley, [place].   I have reviewed the above documentation for accuracy and completeness, and I agree with the above. -  Georgianne Fick, FNP

## 2020-11-22 ENCOUNTER — Other Ambulatory Visit: Payer: Self-pay

## 2020-11-22 ENCOUNTER — Other Ambulatory Visit (INDEPENDENT_AMBULATORY_CARE_PROVIDER_SITE_OTHER): Payer: Self-pay | Admitting: Family Medicine

## 2020-11-22 ENCOUNTER — Encounter (INDEPENDENT_AMBULATORY_CARE_PROVIDER_SITE_OTHER): Payer: Self-pay | Admitting: Family Medicine

## 2020-11-22 ENCOUNTER — Ambulatory Visit (INDEPENDENT_AMBULATORY_CARE_PROVIDER_SITE_OTHER): Payer: BC Managed Care – PPO | Admitting: Family Medicine

## 2020-11-22 VITALS — BP 146/87 | HR 60 | Temp 98.5°F | Ht 67.0 in | Wt 277.0 lb

## 2020-11-22 DIAGNOSIS — R7303 Prediabetes: Secondary | ICD-10-CM | POA: Diagnosis not present

## 2020-11-22 DIAGNOSIS — Z6841 Body Mass Index (BMI) 40.0 and over, adult: Secondary | ICD-10-CM | POA: Diagnosis not present

## 2020-11-22 DIAGNOSIS — E66813 Obesity, class 3: Secondary | ICD-10-CM

## 2020-11-22 DIAGNOSIS — E559 Vitamin D deficiency, unspecified: Secondary | ICD-10-CM

## 2020-11-22 DIAGNOSIS — Z9189 Other specified personal risk factors, not elsewhere classified: Secondary | ICD-10-CM

## 2020-11-22 MED ORDER — TIRZEPATIDE 5 MG/0.5ML ~~LOC~~ SOAJ
5.0000 mg | SUBCUTANEOUS | 0 refills | Status: DC
Start: 2020-11-22 — End: 2021-01-02

## 2020-11-22 MED ORDER — VITAMIN D (ERGOCALCIFEROL) 1.25 MG (50000 UNIT) PO CAPS: 50000.0000 [IU] | ORAL_CAPSULE | ORAL | 0 refills | Status: DC

## 2020-11-27 NOTE — Telephone Encounter (Signed)
Dr.Beasley 

## 2020-11-27 NOTE — Progress Notes (Signed)
Chief Complaint:   OBESITY Gabriella Riley is here to discuss her progress with her obesity treatment plan along with follow-up of her obesity related diagnoses. Gabriella Riley is on the Category 3 Plan and states she is following her eating plan approximately 80% of the time. Gabriella Riley states she is riding the bike for 30-45 minutes 4 times per week.  Today's visit was #: 5 Starting weight: 270 lbs Starting date: 09/20/2020 Today's weight: 277 lbs Today's date: 11/22/2020 Total lbs lost to date: 0 Total lbs lost since last in-office visit: 0  Interim History: Gabriella Riley is retaining a fir amount of water weight today. She has been working on meal planning and doing well overall now that school is starting back.  Subjective:   1. Pre-diabetes Gabriella Riley started [facility] and she is tolerating it well. She has done 3 doses, and she denies nausea or vomiting, but notes mild decrease in appetite.  2. Vitamin D deficiency Gabriella Riley is stable and she denies nausea, vomiting, or muscle weakness. Her Vit D level is not yet at goal.  3. At risk for heart disease Gabriella Riley is at a higher than average risk for cardiovascular disease due to obesity.   Assessment/Plan:   1. Pre-diabetes Gabriella Riley agreed to increase Mounjaro to 5 mg q weekly with no refills. She will continue to work on weight loss, exercise, and decreasing simple carbohydrates to help decrease the risk of diabetes.   - tirzepatide [facility]) 5 MG/0.5ML Pen; Inject 5 mg into the skin once a week.  Dispense: 6 mL; Refill: 0  2. Vitamin D deficiency Low Vitamin D level contributes to fatigue and are associated with obesity, breast, and colon cancer. Gabriella Riley agreed to continue prescription Vitamin D 50,000 IU every week and we will refill for 1 month. She will follow-up for routine testing of Vitamin D, at least 2-3 times per year to avoid over-replacement.  - Vitamin D, Ergocalciferol, (DRISDOL) 1.25 MG (50000 UNIT) CAPS capsule; Take 1 capsule (50,000 Units  total) by mouth every 7 (seven) days.  Dispense: 4 capsule; Refill: 0  3. At risk for heart disease Gabriella Riley was given approximately 15 minutes of coronary artery disease prevention counseling today. She is 46 y.o. female and has risk factors for heart disease including obesity. We discussed intensive lifestyle modifications today with an emphasis on specific weight loss instructions and strategies.   Repetitive spaced learning was employed today to elicit superior memory formation and behavioral change.  4. Obesity with current BMI of 43.5 Gabriella Riley is currently in the action stage of change. As such, her goal is to continue with weight loss efforts. She has agreed to the Category 3 Plan.   Exercise goals: As is.  Behavioral modification strategies: increasing lean protein intake (especially add fish/seafood) and meal planning and cooking strategies.  Gabriella Riley has agreed to follow-up with our clinic in 2 to 3 weeks. She was informed of the importance of frequent follow-up visits to maximize her success with intensive lifestyle modifications for her multiple health conditions.   Objective:   Blood pressure (!) 146/87, pulse 60, temperature 98.5 F (36.9 C), height '5\' 7"'$  (1.702 m), weight 277 lb (125.6 kg), SpO2 100 %. Body mass index is 43.38 kg/m.  General: Cooperative, alert, well developed, in no acute distress. HEENT: Conjunctivae and lids unremarkable. Cardiovascular: Regular rhythm.  Lungs: Normal work of breathing. Neurologic: No focal deficits.   Lab Results  Component Value Date   CREATININE 0.95 09/20/2020   BUN 11 09/20/2020  NA 140 09/20/2020   K 4.4 09/20/2020   CL 106 09/20/2020   CO2 24 09/20/2020   Lab Results  Component Value Date   ALT 12 09/20/2020   AST 13 09/20/2020   ALKPHOS 89 09/20/2020   BILITOT 0.4 09/20/2020   Lab Results  Component Value Date   HGBA1C 5.9 (H) 09/20/2020   HGBA1C 5.6 05/25/2018   Lab Results  Component Value Date   INSULIN 11.1  09/20/2020   Lab Results  Component Value Date   TSH 0.965 09/20/2020   Lab Results  Component Value Date   CHOL 186 09/20/2020   HDL 47 09/20/2020   LDLCALC 127 (H) 09/20/2020   TRIG 66 09/20/2020   CHOLHDL 3.1 09/28/2017   Lab Results  Component Value Date   VD25OH 16.9 (L) 09/20/2020   VD25OH 11.3 (L) 09/28/2017   Lab Results  Component Value Date   WBC 9.3 09/20/2020   HGB 11.7 09/20/2020   HCT 39.8 09/20/2020   MCV 72 (L) 09/20/2020   PLT 335 09/20/2020   Lab Results  Component Value Date   IRON 25 (L) 09/20/2020   TIBC 335 09/20/2020   FERRITIN 34 09/20/2020   Attestation Statements:   Reviewed by clinician on day of visit: allergies, medications, problem list, medical history, surgical history, family history, social history, and previous encounter notes.   I, Trixie Dredge, am acting as transcriptionist for Dennard Nip, MD.  I have reviewed the above documentation for accuracy and completeness, and I agree with the above. -  Dennard Nip, MD

## 2020-12-17 ENCOUNTER — Encounter (INDEPENDENT_AMBULATORY_CARE_PROVIDER_SITE_OTHER): Payer: Self-pay | Admitting: Family Medicine

## 2020-12-17 ENCOUNTER — Other Ambulatory Visit: Payer: Self-pay

## 2020-12-17 ENCOUNTER — Ambulatory Visit (INDEPENDENT_AMBULATORY_CARE_PROVIDER_SITE_OTHER): Payer: BC Managed Care – PPO | Admitting: Family Medicine

## 2020-12-17 VITALS — BP 146/92 | HR 66 | Temp 98.8°F | Ht 67.0 in | Wt 271.0 lb

## 2020-12-17 DIAGNOSIS — Z9189 Other specified personal risk factors, not elsewhere classified: Secondary | ICD-10-CM | POA: Diagnosis not present

## 2020-12-17 DIAGNOSIS — Z6841 Body Mass Index (BMI) 40.0 and over, adult: Secondary | ICD-10-CM

## 2020-12-17 DIAGNOSIS — R7303 Prediabetes: Secondary | ICD-10-CM

## 2020-12-17 DIAGNOSIS — I10 Essential (primary) hypertension: Secondary | ICD-10-CM | POA: Diagnosis not present

## 2020-12-18 NOTE — Progress Notes (Signed)
Chief Complaint:   OBESITY Gabriella Riley is here to discuss her progress with her obesity treatment plan along with follow-up of her obesity related diagnoses. Gabriella Riley is on the Category 3 Plan and states she is following her eating plan approximately 25% of the time. Gabriella Riley states she is doing 0 minutes 0 times per week.  Today's visit was #: 6 Starting weight: 270 lbs Starting date: 09/20/2020 Today's weight: 271 lbs Today's date: 12/17/2020 Total lbs lost to date: 0 Total lbs lost since last in-office visit: 6  Interim History: Gabriella Riley has done well with weight loss, but she has struggled to follow her plan closely due to nausea from [place]. She is ok to look at other meal plan options.  Subjective:   1. Pre-diabetes Gabriella Riley started Mounjaro at 5 mg and she noticed some increased nausea the first few days. She was unable to eat all of the food on her plan.  2. Essential hypertension Gabriella Riley's blood pressure is elevated again for the second time in a row. She is now on [name] which can decrease her blood pressure.  3. At risk for heart disease Gabriella Riley is at a higher than average risk for cardiovascular disease due to obesity.   Assessment/Plan:   1. Pre-diabetes Gabriella Riley will continue Mounjaro and make sure to take it after eating and not go too long in between meals as this can increase nausea. She will continue with weight loss, exercise, and decreasing simple carbohydrates to help decrease the risk of diabetes.   2. Essential hypertension Gabriella Riley will continue with diet and exercise, and may need to start HCTZ of her blood pressure remains elevated in the next 2 weeks.  3. At risk for heart disease Gabriella Riley was given approximately 15 minutes of coronary artery disease prevention counseling today. She is 45 y.o. female and has risk factors for heart disease including obesity. We discussed intensive lifestyle modifications today with an emphasis on specific weight loss instructions and  strategies.   Repetitive spaced learning was employed today to elicit superior memory formation and behavioral change.  4. Obesity with current BMI of 42.4 Gabriella Riley is currently in the action stage of change. As such, her goal is to continue with weight loss efforts. She has agreed to keeping a food journal and adhering to recommended goals of 1400-1600 calories and 90+ grams of protein daily.   Behavioral modification strategies: increasing lean protein intake and no skipping meals.  Gabriella Riley has agreed to follow-up with our clinic in 2 weeks. She was informed of the importance of frequent follow-up visits to maximize her success with intensive lifestyle modifications for her multiple health conditions.   Objective:   Blood pressure (!) 146/92, pulse 66, temperature 98.8 F (37.1 C), height 5\' 7"  (1.702 m), weight 271 lb (122.9 kg), SpO2 99 %. Body mass index is 42.44 kg/m.  General: Cooperative, alert, well developed, in no acute distress. HEENT: Conjunctivae and lids unremarkable. Cardiovascular: Regular rhythm.  Lungs: Normal work of breathing. Neurologic: No focal deficits.   Lab Results  Component Value Date   CREATININE 0.95 09/20/2020   BUN 11 09/20/2020   NA 140 09/20/2020   K 4.4 09/20/2020   CL 106 09/20/2020   CO2 24 09/20/2020   Lab Results  Component Value Date   ALT 12 09/20/2020   AST 13 09/20/2020   ALKPHOS 89 09/20/2020   BILITOT 0.4 09/20/2020   Lab Results  Component Value Date   HGBA1C 5.9 (H) 09/20/2020   HGBA1C  5.6 05/25/2018   Lab Results  Component Value Date   INSULIN 11.1 09/20/2020   Lab Results  Component Value Date   TSH 0.965 09/20/2020   Lab Results  Component Value Date   CHOL 186 09/20/2020   HDL 47 09/20/2020   LDLCALC 127 (H) 09/20/2020   TRIG 66 09/20/2020   CHOLHDL 3.1 09/28/2017   Lab Results  Component Value Date   VD25OH 16.9 (L) 09/20/2020   VD25OH 11.3 (L) 09/28/2017   Lab Results  Component Value Date   WBC  9.3 09/20/2020   HGB 11.7 09/20/2020   HCT 39.8 09/20/2020   MCV 72 (L) 09/20/2020   PLT 335 09/20/2020   Lab Results  Component Value Date   IRON 25 (L) 09/20/2020   TIBC 335 09/20/2020   FERRITIN 34 09/20/2020   Attestation Statements:   Reviewed by clinician on day of visit: allergies, medications, problem list, medical history, surgical history, family history, social history, and previous encounter notes.   I, Trixie Dredge, am acting as transcriptionist for Dennard Nip, MD.  I have reviewed the above documentation for accuracy and completeness, and I agree with the above. -  Dennard Nip, MD

## 2020-12-19 ENCOUNTER — Other Ambulatory Visit: Payer: Self-pay

## 2020-12-19 ENCOUNTER — Encounter: Payer: Self-pay | Admitting: Internal Medicine

## 2020-12-19 ENCOUNTER — Ambulatory Visit (INDEPENDENT_AMBULATORY_CARE_PROVIDER_SITE_OTHER): Payer: BC Managed Care – PPO | Admitting: Internal Medicine

## 2020-12-19 VITALS — BP 131/84 | HR 79 | Temp 97.5°F | Resp 17 | Ht 67.0 in | Wt 269.9 lb

## 2020-12-19 DIAGNOSIS — R197 Diarrhea, unspecified: Secondary | ICD-10-CM

## 2020-12-19 DIAGNOSIS — E785 Hyperlipidemia, unspecified: Secondary | ICD-10-CM | POA: Insufficient documentation

## 2020-12-19 DIAGNOSIS — R140 Abdominal distension (gaseous): Secondary | ICD-10-CM | POA: Diagnosis not present

## 2020-12-19 DIAGNOSIS — R7303 Prediabetes: Secondary | ICD-10-CM | POA: Insufficient documentation

## 2020-12-19 DIAGNOSIS — R1012 Left upper quadrant pain: Secondary | ICD-10-CM

## 2020-12-19 DIAGNOSIS — R110 Nausea: Secondary | ICD-10-CM

## 2020-12-19 MED ORDER — DICYCLOMINE HCL 10 MG PO CAPS: 10.0000 mg | ORAL_CAPSULE | Freq: Three times a day (TID) | ORAL | 0 refills | Status: DC

## 2020-12-19 MED ORDER — ONDANSETRON 4 MG PO TBDP: 4.0000 mg | ORAL_TABLET | Freq: Three times a day (TID) | ORAL | 0 refills | Status: DC | PRN

## 2020-12-19 NOTE — Patient Instructions (Signed)
Abdominal Bloating When you have abdominal bloating, your abdomen may feel full, tight, or painful. It may also look bigger than normal or swollen (distended). Common causes of abdominal bloating include: Swallowing air. Constipation. Problems digesting food. Eating too much. Irritable bowel syndrome. This is a condition that affects the large intestine. Lactose intolerance. This is an inability to digest lactose, a natural sugar in dairy products. Celiac disease. This is a condition that affects the ability to digest gluten, a protein found in some grains. Gastroparesis. This is a condition that slows down the movement of food in the stomach and small intestine. It is more common in people with diabetes mellitus. Gastroesophageal reflux disease (GERD). This is a digestive condition that makes stomach acid flow back into the esophagus. Urinary retention. This means that the body is holding onto urine, and the bladder cannot be emptied all the way. Follow these instructions at home: Eating and drinking Avoid eating too much. Try not to swallow air while talking or eating. Avoid eating while lying down. Avoid these foods and drinks: Foods that cause gas, such as broccoli, cabbage, cauliflower, and baked beans. Carbonated drinks. Hard candy. Chewing gum. Medicines Take over-the-counter and prescription medicines only as told by your health care provider. Take probiotic medicines. These medicines contain live bacteria or yeasts that can help digestion. Take coated peppermint oil capsules. Activity Try to exercise regularly. Exercise may help to relieve bloating that is caused by gas and relieve constipation. General instructions Keep all follow-up visits as told by your health care provider. This is important. Contact a health care provider if: You have nausea and vomiting. You have diarrhea. You have abdominal pain. You have unusual weight loss or weight gain. You have severe pain,  and medicines do not help. Get help right away if: You have severe chest pain. You have trouble breathing. You have shortness of breath. You have trouble urinating. You have darker urine than normal. You have blood in your stools or have dark, tarry stools. Summary Abdominal bloating means that the abdomen is swollen. Common causes of abdominal bloating are swallowing air, constipation, and problems digesting food. Avoid eating too much and avoid swallowing air. Avoid foods that cause gas, carbonated drinks, hard candy, and chewing gum. This information is not intended to replace advice given to you by your health care provider. Make sure you discuss any questions you have with your health care provider. Document Revised: 06/28/2018 Document Reviewed: 04/11/2016 Elsevier Patient Education  2021 [employer]  

## 2020-12-19 NOTE — Progress Notes (Signed)
Subjective:    Patient ID: Gabriella Riley, female    DOB: [birth year], 45 y.o.   MRN: [id number]  HPI  Patient presents to clinic today with complaint of left-sided abdominal pain.  She reports this started 2 weeks ago.  She describes the pain as cramping and nagging.  She reports associated nausea, bloating, gassiness and loose stools.  She denies vomiting, reflux, current constipation or blood in her stool.  She denies urinary or vaginal symptoms.  She denies recent changes in diet.  She reports she was recently started on Mounjaro by her doctor at the [facility].  She reports she increased her dose on Sunday.  She has taken Pepto-Bismol OTC with minimal relief of symptoms.  Review of Systems     Past Medical History:  Diagnosis Date   Anemia    Arthritis    Back pain    Bilateral swelling of feet and ankles    Constipation    Food allergy    GERD (gastroesophageal reflux disease)    History of blood clots    Hyperlipidemia    Hypertension    Joint pain    Lactose intolerance    Other fatigue    Shortness of breath    Shortness of breath on exertion    Vitamin D deficiency     Current Outpatient Medications  Medication Sig Dispense Refill   acetaminophen (TYLENOL) 500 MG tablet Take 500-1,000 mg by mouth every 6 (six) hours as needed (for pain.).     Multiple Vitamins-Minerals (ADULT GUMMY PO) Take 2 tablets by mouth daily.     tirzepatide [facility]) 5 MG/0.5ML Pen Inject 5 mg into the skin once a week. 6 mL 0   Vitamin D, Ergocalciferol, (DRISDOL) 1.25 MG (50000 UNIT) CAPS capsule Take 1 capsule (50,000 Units total) by mouth every 7 (seven) days. 4 capsule 0   No current facility-administered medications for this visit.    Allergies  Allergen Reactions   Shellfish Allergy    Latex Rash    Family History  Problem Relation Age of Onset   Obesity Father    Sleep apnea Father    Heart disease Father    Diabetes Father     Hyperlipidemia Father    Hypertension Father    Heart failure Father    Arthritis Maternal Grandmother    Heart disease Other    Sleep apnea Other    Obesity Other     Social History   Socioeconomic History   Marital status: Married    Spouse name: Will   Number of children: 4   Years of education: Not on file   Highest education level: Not on file  Occupational History   Occupation: Multimedia programmer    Employer: [employer]  Tobacco Use   Smoking status: Never   Smokeless tobacco: Never  Vaping Use   Vaping Use: Never used  Substance and Sexual Activity   Alcohol use: Yes    Alcohol/week: 0.0 standard drinks    Comment: social   Drug use: No   Sexual activity: Yes    Birth control/protection: None, Surgical    Comment: tubal ligation   Other Topics Concern   Not on file  Social History Narrative   Not on file   Social Determinants of Health   Financial Resource Strain: Not on file  Food Insecurity: Not on file  Transportation Needs: Not on file  Physical Activity: Not on file  Stress:  Not on file  Social Connections: Not on file  Intimate Partner Violence: Not on file     Constitutional: Denies fever, malaise, fatigue, headache or abrupt weight changes.  Respiratory: Denies difficulty breathing, shortness of breath, cough or sputum production.   Cardiovascular: Denies chest pain, chest tightness, palpitations or swelling in the hands or feet.  Gastrointestinal: Patient reports left-sided abdominal pain, nausea, bloating, gassiness and diarrhea.  Denies constipation,  or blood in the stool.  GU: Denies urgency, frequency, pain with urination, burning sensation, blood in urine, odor or discharge.  No other specific complaints in a complete review of systems (except as listed in HPI above).  Objective:   Physical Exam  BP 131/84 (BP Location: Right Arm, Patient Position: Sitting, Cuff Size: Large)   Pulse 79   Temp (!) 97.5 F (36.4  C) (Temporal)   Resp 17   Ht 5\' 7"  (1.702 m)   Wt 269 lb 14.4 oz (122.4 kg)   SpO2 100%   BMI 42.27 kg/m   Wt Readings from Last 3 Encounters:  12/17/20 271 lb (122.9 kg)  11/22/20 277 lb (125.6 kg)  11/08/20 273 lb (123.8 kg)    General: Appears her stated age, obese, in NAD. Skin: Warm, dry and intact. No rashes noted. Cardiovascular: Normal rate and rhythm. S1,S2 noted.  No murmur, rubs or gallops noted.  Pulmonary/Chest: Normal effort and positive vesicular breath sounds. No respiratory distress. No wheezes, rales or ronchi noted.  Abdomen: Soft and tender in the LUQ. Normal bowel sounds. No distention or masses noted.  Musculoskeletal: No difficulty with gait.  Neurological: Alert and oriented.    BMET    Component Value Date/Time   NA 140 09/20/2020 0844   K 4.4 09/20/2020 0844   CL 106 09/20/2020 0844   CO2 24 09/20/2020 0844   GLUCOSE 88 09/20/2020 0844   GLUCOSE 84 07/08/2019 1633   BUN 11 09/20/2020 0844   CREATININE 0.95 09/20/2020 0844   CREATININE 0.90 07/08/2019 1633   CALCIUM 9.0 09/20/2020 0844   GFRNONAA 80 09/28/2017 1005   GFRAA 92 09/28/2017 1005    Lipid Panel     Component Value Date/Time   CHOL 186 09/20/2020 0844   TRIG 66 09/20/2020 0844   HDL 47 09/20/2020 0844   CHOLHDL 3.1 09/28/2017 1005   LDLCALC 127 (H) 09/20/2020 0844    CBC    Component Value Date/Time   WBC 9.3 09/20/2020 0844   WBC 11.1 (H) 07/08/2019 1633   RBC 5.52 (H) 09/20/2020 0844   RBC 5.23 (H) 07/08/2019 1633   HGB 11.7 09/20/2020 0844   HCT 39.8 09/20/2020 0844   PLT 335 09/20/2020 0844   MCV 72 (L) 09/20/2020 0844   MCH 21.2 (L) 09/20/2020 0844   MCH 20.3 (L) 07/08/2019 1633   MCHC 29.4 (L) 09/20/2020 0844   MCHC 29.2 (L) 07/08/2019 1633   RDW 18.1 (H) 09/20/2020 0844   LYMPHSABS 2.0 09/20/2020 0844   EOSABS 0.3 09/20/2020 0844   BASOSABS 0.0 09/20/2020 0844    Hgb A1C Lab Results  Component Value Date   HGBA1C 5.9 (H) 09/20/2020            Assessment & Plan:   LUQ Pain, Nausea, Bloating, Gassiness, Diarrhea:  DDx include GERD, gastritis, pancreatitis, diverticulitis, medication side effect Will check CBC, CMET, Amylase and Lipase I think this is likely consistent with medication side effect of Mounjero-advised her to stop this RX for Zofran 4 mg ODT every 8 hours as  needed Rx for Bentyl 10 mg 4 times daily. Encourage rest and fluids Work note provided  We will follow-up after labs, return precautions discussed Webb Silversmith, NP This visit occurred during the SARS-CoV-2 public health emergency.  Safety protocols were in place, including screening questions prior to the visit, additional usage of staff PPE, and extensive cleaning of exam room while observing appropriate contact time as indicated for disinfecting solutions.

## 2020-12-20 LAB — COMPLETE METABOLIC PANEL WITH GFR
AG Ratio: 1.3 (calc) (ref 1.0–2.5)
ALT: 9 U/L (ref 6–29)
AST: 14 U/L (ref 10–30)
Albumin: 3.8 g/dL (ref 3.6–5.1)
Alkaline phosphatase (APISO): 78 U/L (ref 31–125)
BUN/Creatinine Ratio: 13 (calc) (ref 6–22)
BUN: 13 mg/dL (ref 7–25)
CO2: 22 mmol/L (ref 20–32)
Calcium: 9.3 mg/dL (ref 8.6–10.2)
Chloride: 106 mmol/L (ref 98–110)
Creat: 1.04 mg/dL — ABNORMAL HIGH (ref 0.50–0.99)
Globulin: 3 g/dL (calc) (ref 1.9–3.7)
Glucose, Bld: 72 mg/dL (ref 65–99)
Potassium: 4.3 mmol/L (ref 3.5–5.3)
Sodium: 138 mmol/L (ref 135–146)
Total Bilirubin: 0.4 mg/dL (ref 0.2–1.2)
Total Protein: 6.8 g/dL (ref 6.1–8.1)
eGFR: 68 mL/min/{1.73_m2} (ref >=60)

## 2020-12-20 LAB — CBC
HCT: 40.7 % (ref 35.0–45.0)
Hemoglobin: 12.3 g/dL (ref 11.7–15.5)
MCH: 21.4 pg — ABNORMAL LOW (ref 27.0–33.0)
MCHC: 30.2 g/dL — ABNORMAL LOW (ref 32.0–36.0)
MCV: 70.7 fL — ABNORMAL LOW (ref 80.0–100.0)
MPV: 12.2 fL (ref 7.5–12.5)
Platelets: 329 10*3/uL (ref 140–400)
RBC: 5.76 10*6/uL — ABNORMAL HIGH (ref 3.80–5.10)
RDW: 16.4 % — ABNORMAL HIGH (ref 11.0–15.0)
WBC: 9.6 10*3/uL (ref 3.8–10.8)

## 2020-12-20 LAB — AMYLASE: Amylase: 41 U/L (ref 21–101)

## 2020-12-20 LAB — LIPASE: Lipase: 18 U/L (ref 7–60)

## 2020-12-26 ENCOUNTER — Encounter: Payer: Self-pay | Admitting: Internal Medicine

## 2021-01-02 ENCOUNTER — Other Ambulatory Visit: Payer: Self-pay

## 2021-01-02 ENCOUNTER — Ambulatory Visit (INDEPENDENT_AMBULATORY_CARE_PROVIDER_SITE_OTHER): Payer: BC Managed Care – PPO | Admitting: Family Medicine

## 2021-01-02 ENCOUNTER — Other Ambulatory Visit (INDEPENDENT_AMBULATORY_CARE_PROVIDER_SITE_OTHER): Payer: Self-pay | Admitting: Family Medicine

## 2021-01-02 ENCOUNTER — Encounter (INDEPENDENT_AMBULATORY_CARE_PROVIDER_SITE_OTHER): Payer: Self-pay | Admitting: Family Medicine

## 2021-01-02 VITALS — BP 129/87 | HR 68 | Temp 97.8°F | Ht 67.0 in | Wt 266.0 lb

## 2021-01-02 DIAGNOSIS — E559 Vitamin D deficiency, unspecified: Secondary | ICD-10-CM

## 2021-01-02 DIAGNOSIS — Z6841 Body Mass Index (BMI) 40.0 and over, adult: Secondary | ICD-10-CM | POA: Diagnosis not present

## 2021-01-02 DIAGNOSIS — R7303 Prediabetes: Secondary | ICD-10-CM

## 2021-01-02 DIAGNOSIS — Z9189 Other specified personal risk factors, not elsewhere classified: Secondary | ICD-10-CM | POA: Diagnosis not present

## 2021-01-02 DIAGNOSIS — E66813 Obesity, class 3: Secondary | ICD-10-CM

## 2021-01-02 MED ORDER — VITAMIN D (ERGOCALCIFEROL) 1.25 MG (50000 UNIT) PO CAPS: 50000.0000 [IU] | ORAL_CAPSULE | ORAL | 0 refills | Status: DC

## 2021-01-02 MED ORDER — TIRZEPATIDE 2.5 MG/0.5ML ~~LOC~~ SOAJ: 2.5000 mg | SUBCUTANEOUS | 0 refills | Status: DC

## 2021-01-02 NOTE — Progress Notes (Signed)
Chief Complaint:   OBESITY Gabriella Riley is here to discuss her progress with her obesity treatment plan along with follow-up of her obesity related diagnoses. Roben is on keeping a food journal and adhering to recommended goals of 1400-1600 calories and 90+ grams of protein daily and states she is following her eating plan approximately 80% of the time. [name] states she is walking for 30-45 minutes 5 times per week.  Today's visit was #: 7 Starting weight: 270 lbs Starting date: 09/20/2020 Today's weight: 266 lbs Today's date: 01/02/2021 Total lbs lost to date: 4 Total lbs lost since last in-office visit: 5  Interim History: Gabriella Riley has been deviating from her plan more. She is making healthy choices overall, but her nutrition may be falling and this can decrease her RMR relatively quickly.  Subjective:   1. Pre-diabetes [name]'s Mounjaro was increased to 5.0 mg and she has significant nausea. Her primary care provider recommended stopping this and she feels better now.  2. Vitamin D deficiency Gabriella Riley is stable on Vit D, but her level is not yet at goal.  3. At risk for impaired metabolic function Gabriella Riley is at increased risk for impaired metabolic function if nutrition is too low.  Assessment/Plan:   1. Pre-diabetes Lennox agreed to decrease Mounjaro to 2.5 mg q weekly, and we will refill for 1 month, and will continue to monitor. She will continue to work on weight loss, exercise, and decreasing simple carbohydrates to help decrease the risk of diabetes.   - tirzepatide [facility]) 2.5 MG/0.5ML Pen; Inject 2.5 mg into the skin once a week.  Dispense: 2 mL; Refill: 0  2. Vitamin D deficiency Low Vitamin D level contributes to fatigue and are associated with obesity, breast, and colon cancer. Mckell will continue prescription Vitamin D 50,000 IU every week and we will refill for 1 month. She will follow-up for routine testing of Vitamin D, at least 2-3 times per year to avoid  over-replacement.  - Vitamin D, Ergocalciferol, (DRISDOL) 1.25 MG (50000 UNIT) CAPS capsule; Take 1 capsule (50,000 Units total) by mouth every 7 (seven) days.  Dispense: 4 capsule; Refill: 0  3. At risk for impaired metabolic function [name] was given approximately 15 minutes of impaired  metabolic function prevention counseling today. We discussed intensive lifestyle modifications today with an emphasis on specific nutrition and exercise instructions and strategies.   Repetitive spaced learning was employed today to elicit superior memory formation and behavioral change.  4. Obesity with current BMI of 41.8 Gabriella Riley is currently in the action stage of change. As such, her goal is to continue with weight loss efforts. She has agreed to change to the Category 2 Plan or keeping a food journal and adhering to recommended goals of 1200-1400 calories and 80+ grams of protein daily.   Exercise goals: As is.  Behavioral modification strategies: increasing lean protein intake and no skipping meals.  [name] has agreed to follow-up with our clinic in 2 to 3 weeks. She was informed of the importance of frequent follow-up visits to maximize her success with intensive lifestyle modifications for her multiple health conditions.   Objective:   Blood pressure 129/87, pulse 68, temperature 97.8 F (36.6 C), height 5\' 7"  (1.702 m), weight 266 lb (120.7 kg), SpO2 99 %. Body mass index is 41.66 kg/m.  General: Cooperative, alert, well developed, in no acute distress. HEENT: Conjunctivae and lids unremarkable. Cardiovascular: Regular rhythm.  Lungs: Normal work of breathing. Neurologic: No focal deficits.   Lab  Results  Component Value Date   CREATININE 1.04 (H) 12/19/2020   BUN 13 12/19/2020   NA 138 12/19/2020   K 4.3 12/19/2020   CL 106 12/19/2020   CO2 22 12/19/2020   Lab Results  Component Value Date   ALT 9 12/19/2020   AST 14 12/19/2020   ALKPHOS 89 09/20/2020   BILITOT 0.4 12/19/2020    Lab Results  Component Value Date   HGBA1C 5.9 (H) 09/20/2020   HGBA1C 5.6 05/25/2018   Lab Results  Component Value Date   INSULIN 11.1 09/20/2020   Lab Results  Component Value Date   TSH 0.965 09/20/2020   Lab Results  Component Value Date   CHOL 186 09/20/2020   HDL 47 09/20/2020   LDLCALC 127 (H) 09/20/2020   TRIG 66 09/20/2020   CHOLHDL 3.1 09/28/2017   Lab Results  Component Value Date   VD25OH 16.9 (L) 09/20/2020   VD25OH 11.3 (L) 09/28/2017   Lab Results  Component Value Date   WBC 9.6 12/19/2020   HGB 12.3 12/19/2020   HCT 40.7 12/19/2020   MCV 70.7 (L) 12/19/2020   PLT 329 12/19/2020   Lab Results  Component Value Date   IRON 25 (L) 09/20/2020   TIBC 335 09/20/2020   FERRITIN 34 09/20/2020   Attestation Statements:   Reviewed by clinician on day of visit: allergies, medications, problem list, medical history, surgical history, family history, social history, and previous encounter notes.   I, Trixie Dredge, am acting as transcriptionist for Dennard Nip, MD.  I have reviewed the above documentation for accuracy and completeness, and I agree with the above. -  Dennard Nip, MD

## 2021-01-02 NOTE — Telephone Encounter (Signed)
Dr.Beasley 

## 2021-01-03 ENCOUNTER — Other Ambulatory Visit (INDEPENDENT_AMBULATORY_CARE_PROVIDER_SITE_OTHER): Payer: Self-pay | Admitting: Family Medicine

## 2021-01-03 DIAGNOSIS — R7303 Prediabetes: Secondary | ICD-10-CM

## 2021-01-03 NOTE — Telephone Encounter (Signed)
Prior authorization started for Mounjaro. Will notify patient and provider once response is received.  ?

## 2021-01-03 NOTE — Telephone Encounter (Signed)
PA needed

## 2021-01-07 ENCOUNTER — Encounter (INDEPENDENT_AMBULATORY_CARE_PROVIDER_SITE_OTHER): Payer: Self-pay

## 2021-01-16 ENCOUNTER — Ambulatory Visit (INDEPENDENT_AMBULATORY_CARE_PROVIDER_SITE_OTHER): Payer: BC Managed Care – PPO | Admitting: Family Medicine

## 2021-01-16 ENCOUNTER — Encounter (INDEPENDENT_AMBULATORY_CARE_PROVIDER_SITE_OTHER): Payer: Self-pay | Admitting: Family Medicine

## 2021-01-16 ENCOUNTER — Other Ambulatory Visit: Payer: Self-pay

## 2021-01-16 VITALS — BP 132/92 | HR 75 | Temp 98.5°F | Ht 67.0 in | Wt 263.0 lb

## 2021-01-16 DIAGNOSIS — R7303 Prediabetes: Secondary | ICD-10-CM | POA: Diagnosis not present

## 2021-01-16 DIAGNOSIS — E559 Vitamin D deficiency, unspecified: Secondary | ICD-10-CM

## 2021-01-16 DIAGNOSIS — Z9189 Other specified personal risk factors, not elsewhere classified: Secondary | ICD-10-CM

## 2021-01-16 DIAGNOSIS — Z6841 Body Mass Index (BMI) 40.0 and over, adult: Secondary | ICD-10-CM

## 2021-01-16 MED ORDER — TIRZEPATIDE 2.5 MG/0.5ML ~~LOC~~ SOAJ: 2.5000 mg | SUBCUTANEOUS | 0 refills | Status: DC

## 2021-01-16 MED ORDER — VITAMIN D (ERGOCALCIFEROL) 1.25 MG (50000 UNIT) PO CAPS: 50000.0000 [IU] | ORAL_CAPSULE | ORAL | 0 refills | Status: DC

## 2021-01-16 NOTE — Progress Notes (Signed)
Chief Complaint:   OBESITY Rylann is here to discuss her progress with her obesity treatment plan along with follow-up of her obesity related diagnoses. Rossi is on the Category 2 Plan or keeping a food journal and adhering to recommended goals of 1200-1400 calories and 80+ grams of protein daily and states she is following her eating plan approximately 50% of the time. Glee states she is walking for 30 minutes 7 times per week.  Today's visit was #: 8 Starting weight: 270 lbs Starting date: 09/20/2020 Today's weight: 263 lbs Today's date: 01/16/2021 Total lbs lost to date: 7 Total lbs lost since last in-office visit: 3  Interim History: Kristia continues to do well with weight loss. She is doing well with meeting her protein and calorie goals. She is exercising regularly and feeling well overall.  Subjective:   1. Pre-diabetes Kehlani is on Mounjaro at 2.5 mg and she is doing well. She had some GI issues with 5 mg previously, but she feels she is ready to increase her dose again.  2. Vitamin D deficiency Roseland is on Vit D, but her level is not yet at goal.  3. At risk for constipation Antanette is at increased risk for constipation due to weight loss and Mounjaro.  Assessment/Plan:   1. Pre-diabetes Jaycey agreed to increase Mounjaro to 5.0 mg q weekly with no refills. She will continue to work on weight loss, exercise, and decreasing simple carbohydrates to help decrease the risk of diabetes. We will recheck labs in 1 month.  - tirzepatide [facility]) 2.5 MG/0.5ML Pen; Inject 2.5 mg into the skin once a week.  Dispense: 2 mL; Refill: 0  2. Vitamin D deficiency Low Vitamin D level contributes to fatigue and are associated with obesity, breast, and colon cancer. We will refill prescription Vitamin D for 1 month, and we will recheck labs in 1 month. Kori will follow-up for routine testing of Vitamin D, at least 2-3 times per year to avoid over-replacement.  - Vitamin D,  Ergocalciferol, (DRISDOL) 1.25 MG (50000 UNIT) CAPS capsule; Take 1 capsule (50,000 Units total) by mouth every 7 (seven) days.  Dispense: 4 capsule; Refill: 0  3. At risk for constipation Leilany was given approximately 15 minutes of counseling today regarding prevention of constipation. She was encouraged to increase water and fiber intake.   4. Obesity with current BMI of 41.3 Abeeha is currently in the action stage of change. As such, her goal is to continue with weight loss efforts. She has agreed to keeping a food journal and adhering to recommended goals of 1400-1600 calories and 90+ grams of protein daily.   Exercise goals: As is.  Behavioral modification strategies: increasing lean protein intake, increasing vegetables, and increasing water intake.  Keniyah has agreed to follow-up with our clinic in 3 weeks. She was informed of the importance of frequent follow-up visits to maximize her success with intensive lifestyle modifications for her multiple health conditions.   Objective:   Blood pressure (!) 132/92, pulse 75, temperature 98.5 F (36.9 C), height 5\' 7"  (1.702 m), weight 263 lb (119.3 kg), SpO2 99 %. Body mass index is 41.19 kg/m.  General: Cooperative, alert, well developed, in no acute distress. HEENT: Conjunctivae and lids unremarkable. Cardiovascular: Regular rhythm.  Lungs: Normal work of breathing. Neurologic: No focal deficits.   Lab Results  Component Value Date   CREATININE 1.04 (H) 12/19/2020   BUN 13 12/19/2020   NA 138 12/19/2020   K 4.3 12/19/2020  CL 106 12/19/2020   CO2 22 12/19/2020   Lab Results  Component Value Date   ALT 9 12/19/2020   AST 14 12/19/2020   ALKPHOS 89 09/20/2020   BILITOT 0.4 12/19/2020   Lab Results  Component Value Date   HGBA1C 5.9 (H) 09/20/2020   HGBA1C 5.6 05/25/2018   Lab Results  Component Value Date   INSULIN 11.1 09/20/2020   Lab Results  Component Value Date   TSH 0.965 09/20/2020   Lab Results   Component Value Date   CHOL 186 09/20/2020   HDL 47 09/20/2020   LDLCALC 127 (H) 09/20/2020   TRIG 66 09/20/2020   CHOLHDL 3.1 09/28/2017   Lab Results  Component Value Date   VD25OH 16.9 (L) 09/20/2020   VD25OH 11.3 (L) 09/28/2017   Lab Results  Component Value Date   WBC 9.6 12/19/2020   HGB 12.3 12/19/2020   HCT 40.7 12/19/2020   MCV 70.7 (L) 12/19/2020   PLT 329 12/19/2020   Lab Results  Component Value Date   IRON 25 (L) 09/20/2020   TIBC 335 09/20/2020   FERRITIN 34 09/20/2020   Attestation Statements:   Reviewed by clinician on day of visit: allergies, medications, problem list, medical history, surgical history, family history, social history, and previous encounter notes.   I, Trixie Dredge, am acting as Location manager for Dennard Nip, MD.  I have reviewed the above documentation for accuracy and completeness, and I agree with the above. -  Dennard Nip, MD

## 2021-01-31 ENCOUNTER — Ambulatory Visit (INDEPENDENT_AMBULATORY_CARE_PROVIDER_SITE_OTHER): Payer: BC Managed Care – PPO | Admitting: Family Medicine

## 2021-01-31 ENCOUNTER — Encounter: Payer: Self-pay | Admitting: Radiology

## 2021-02-21 ENCOUNTER — Other Ambulatory Visit: Payer: Self-pay

## 2021-02-21 ENCOUNTER — Ambulatory Visit (INDEPENDENT_AMBULATORY_CARE_PROVIDER_SITE_OTHER): Payer: BC Managed Care – PPO | Admitting: Family Medicine

## 2021-02-21 ENCOUNTER — Encounter (INDEPENDENT_AMBULATORY_CARE_PROVIDER_SITE_OTHER): Payer: Self-pay | Admitting: Family Medicine

## 2021-02-21 VITALS — BP 137/87 | HR 66 | Temp 97.8°F | Ht 67.0 in | Wt 260.0 lb

## 2021-02-21 DIAGNOSIS — Z6841 Body Mass Index (BMI) 40.0 and over, adult: Secondary | ICD-10-CM

## 2021-02-21 DIAGNOSIS — Z9189 Other specified personal risk factors, not elsewhere classified: Secondary | ICD-10-CM

## 2021-02-21 DIAGNOSIS — R7303 Prediabetes: Secondary | ICD-10-CM

## 2021-02-21 DIAGNOSIS — E559 Vitamin D deficiency, unspecified: Secondary | ICD-10-CM | POA: Diagnosis not present

## 2021-02-21 DIAGNOSIS — R030 Elevated blood-pressure reading, without diagnosis of hypertension: Secondary | ICD-10-CM

## 2021-02-21 MED ORDER — VITAMIN D (ERGOCALCIFEROL) 1.25 MG (50000 UNIT) PO CAPS: 50000.0000 [IU] | ORAL_CAPSULE | ORAL | 0 refills | Status: DC

## 2021-02-21 NOTE — Progress Notes (Signed)
Chief Complaint:   OBESITY Gabriella Riley is here to discuss her progress with her obesity treatment plan along with follow-up of her obesity related diagnoses. Gabriella Riley is on keeping a food journal and adhering to recommended goals of 1400-1600 calories and 90+ grams of protein daily and states she is following her eating plan approximately 75% of the time. Gabriella Riley states she is on the treadmill for 45 minutes 2 times per week.  Today's visit was #: 9 Starting weight: 270 lbs Starting date: 09/20/2020 Today's weight: 260 lbs Today's date: 02/21/2021 Total lbs lost to date: 10 Total lbs lost since last in-office visit: 3  Interim History: Gabriella Riley continues to do well with weight loss. Her hunger is controlled and she is working  on increasing her protein. She will be doing a lot of traveling soon and she is open to discussing eating out strategies.  Subjective:   1. Vitamin D deficiency Gabriella Riley is stable on Vit D, but her level is not yet at goal.  2. Pre-diabetes Gabriella Riley is on [place], and she missed a dose or 2 due to drug shortages. She has it now and has restarted.  3. Pre-hypertension Gabriella Riley's blood pressure is borderline elevated today. She is doing well with diet, exercise, and weight loss.  4. At risk for heart disease Gabriella Riley is at a higher than average risk for cardiovascular disease due to obesity.   Assessment/Plan:   1. Vitamin D deficiency Low Vitamin D level contributes to fatigue and are associated with obesity, breast, and colon cancer. We will refill prescription Vitamin D for 1 month. Gabriella Riley will follow-up for routine testing of Vitamin D, at least 2-3 times per year to avoid over-replacement.  - Vitamin D, Ergocalciferol, (DRISDOL) 1.25 MG (50000 UNIT) CAPS capsule; Take 1 capsule (50,000 Units total) by mouth every 7 (seven) days.  Dispense: 4 capsule; Refill: 0  2. Pre-diabetes Gabriella Riley will continue Mounjaro, and will continue to work on weight loss, exercise, and  decreasing simple carbohydrates to help decrease the risk of diabetes.   3. Pre-hypertension Gabriella Riley will continue with diet, exercise, and weight loss to improve blood pressure control. We will recheck her blood pressure in 2-3 weeks.  4. At risk for heart disease Gabriella Riley was given approximately 15 minutes of coronary artery disease prevention counseling today. She is 45 y.o. female and has risk factors for heart disease including obesity. We discussed intensive lifestyle modifications today with an emphasis on specific weight loss instructions and strategies.   Repetitive spaced learning was employed today to elicit superior memory formation and behavioral change.   5. Obesity with current BMI of 40.8 Gabriella Riley is currently in the action stage of change. As such, her goal is to continue with weight loss efforts. She has agreed to keeping a food journal and adhering to recommended goals of 1400-1600 calories and 90+ grams of protein daily.   Exercise goals: As is.  Behavioral modification strategies: increasing lean protein intake, travel eating strategies, and holiday eating strategies .  Gabriella Riley has agreed to follow-up with our clinic in 2 to 3 weeks. She was informed of the importance of frequent follow-up visits to maximize her success with intensive lifestyle modifications for her multiple health conditions.   Objective:   Blood pressure 137/87, pulse 66, temperature 97.8 F (36.6 C), height 5\' 7"  (1.702 m), weight 260 lb (117.9 kg), last menstrual period 02/02/2021, SpO2 100 %. Body mass index is 40.72 kg/m.  General: Cooperative, alert, well developed, in no acute  distress. HEENT: Conjunctivae and lids unremarkable. Cardiovascular: Regular rhythm.  Lungs: Normal work of breathing. Neurologic: No focal deficits.   Lab Results  Component Value Date   CREATININE 1.04 (H) 12/19/2020   BUN 13 12/19/2020   NA 138 12/19/2020   K 4.3 12/19/2020   CL 106 12/19/2020   CO2 22 12/19/2020    Lab Results  Component Value Date   ALT 9 12/19/2020   AST 14 12/19/2020   ALKPHOS 89 09/20/2020   BILITOT 0.4 12/19/2020   Lab Results  Component Value Date   HGBA1C 5.9 (H) 09/20/2020   HGBA1C 5.6 05/25/2018   Lab Results  Component Value Date   INSULIN 11.1 09/20/2020   Lab Results  Component Value Date   TSH 0.965 09/20/2020   Lab Results  Component Value Date   CHOL 186 09/20/2020   HDL 47 09/20/2020   LDLCALC 127 (H) 09/20/2020   TRIG 66 09/20/2020   CHOLHDL 3.1 09/28/2017   Lab Results  Component Value Date   VD25OH 16.9 (L) 09/20/2020   VD25OH 11.3 (L) 09/28/2017   Lab Results  Component Value Date   WBC 9.6 12/19/2020   HGB 12.3 12/19/2020   HCT 40.7 12/19/2020   MCV 70.7 (L) 12/19/2020   PLT 329 12/19/2020   Lab Results  Component Value Date   IRON 25 (L) 09/20/2020   TIBC 335 09/20/2020   FERRITIN 34 09/20/2020   Attestation Statements:   Reviewed by clinician on day of visit: allergies, medications, problem list, medical history, surgical history, family history, social history, and previous encounter notes.   I, Trixie Dredge, am acting as transcriptionist for Dennard Nip, MD.  I have reviewed the above documentation for accuracy and completeness, and I agree with the above. -  Dennard Nip, MD

## 2021-03-04 ENCOUNTER — Other Ambulatory Visit: Payer: Self-pay

## 2021-03-04 ENCOUNTER — Encounter: Payer: Self-pay | Admitting: Internal Medicine

## 2021-03-04 ENCOUNTER — Ambulatory Visit: Payer: BC Managed Care – PPO | Admitting: Internal Medicine

## 2021-03-04 VITALS — BP 132/92 | HR 71 | Temp 97.7°F | Resp 18 | Ht 67.0 in | Wt 266.4 lb

## 2021-03-04 DIAGNOSIS — L232 Allergic contact dermatitis due to cosmetics: Secondary | ICD-10-CM

## 2021-03-04 DIAGNOSIS — L739 Follicular disorder, unspecified: Secondary | ICD-10-CM

## 2021-03-04 DIAGNOSIS — L299 Pruritus, unspecified: Secondary | ICD-10-CM

## 2021-03-04 MED ORDER — MUPIROCIN CALCIUM 2 % EX CREA: 1.0000 "application " | TOPICAL_CREAM | Freq: Two times a day (BID) | CUTANEOUS | 0 refills | Status: DC

## 2021-03-04 MED ORDER — PREDNISONE 10 MG PO TABS: ORAL_TABLET | ORAL | 0 refills | Status: DC

## 2021-03-04 NOTE — Progress Notes (Signed)
Subjective:    Patient ID: Gabriella Riley, female    DOB: [birth year], 45 y.o.   MRN: [id number]  HPI  Pt presents to the clinic today with c/o scalp irritation. She reports this started 3 weeks ago. She used a new hair product and is not sure if that is a contributing factor. She has not noticed any rash. She reports she is itching everywhere but her face. She denies swelling of the eyes, lips, tongue or throat. She denies difficulty breathing. She has tried Benadryl and Aveeno lotion OTC with some relief of symptoms.  She also reports a rash in between her breasts. This rash does not itch. She denies recent changes in soaps, lotions or detergents.  Review of Systems     Past Medical History:  Diagnosis Date   Anemia    Arthritis    Back pain    Bilateral swelling of feet and ankles    Constipation    Food allergy    GERD (gastroesophageal reflux disease)    History of blood clots    Hyperlipidemia    Hypertension    Joint pain    Lactose intolerance    Other fatigue    Shortness of breath    Shortness of breath on exertion    Vitamin D deficiency     Current Outpatient Medications  Medication Sig Dispense Refill   acetaminophen (TYLENOL) 500 MG tablet Take 500-1,000 mg by mouth every 6 (six) hours as needed (for pain.).     dicyclomine (BENTYL) 10 MG capsule Take 1 capsule (10 mg total) by mouth 4 (four) times daily -  before meals and at bedtime. 40 capsule 0   Multiple Vitamins-Minerals (ADULT GUMMY PO) Take 2 tablets by mouth daily.     ondansetron (ZOFRAN ODT) 4 MG disintegrating tablet Take 1 tablet (4 mg total) by mouth every 8 (eight) hours as needed for nausea or vomiting. 30 tablet 0   tirzepatide (MOUNJARO) 2.5 MG/0.5ML Pen Inject 2.5 mg into the skin once a week. 2 mL 0   Vitamin D, Ergocalciferol, (DRISDOL) 1.25 MG (50000 UNIT) CAPS capsule Take 1 capsule (50,000 Units total) by mouth every 7 (seven) days. 4 capsule 0   No current  facility-administered medications for this visit.    Allergies  Allergen Reactions   Shellfish Allergy    Latex Rash    Family History  Problem Relation Age of Onset   Obesity Father    Sleep apnea Father    Heart disease Father    Diabetes Father    Hyperlipidemia Father    Hypertension Father    Heart failure Father    Arthritis Maternal Grandmother    Heart disease Other    Sleep apnea Other    Obesity Other     Social History   Socioeconomic History   Marital status: Married    Spouse name: Will   Number of children: 4   Years of education: Not on file   Highest education level: Not on file  Occupational History   Occupation: Multimedia programmer    Employer: [employer]  Tobacco Use   Smoking status: Never   Smokeless tobacco: Never  Vaping Use   Vaping Use: Never used  Substance and Sexual Activity   Alcohol use: Yes    Alcohol/week: 0.0 standard drinks    Comment: social   Drug use: No   Sexual activity: Yes    Birth control/protection: None, Surgical    Comment:  tubal ligation   Other Topics Concern   Not on file  Social History Narrative   Not on file   Social Determinants of Health   Financial Resource Strain: Not on file  Food Insecurity: Not on file  Transportation Needs: Not on file  Physical Activity: Not on file  Stress: Not on file  Social Connections: Not on file  Intimate Partner Violence: Not on file     Constitutional: Denies fever, malaise, fatigue, headache or abrupt weight changes. Marland Kitchen Respiratory: Denies difficulty breathing, shortness of breath, cough or sputum production.   Cardiovascular: Denies chest pain, chest tightness, palpitations or swelling in the hands or feet.  Skin: Pt reports rash between breasts, generalized itching. Denies lesions or ulcercations.   No other specific complaints in a complete review of systems (except as listed in HPI above).  Objective:   Physical Exam  BP (!) 132/92  (BP Location: Right Arm, Patient Position: Sitting, Cuff Size: Large)   Pulse 71   Temp 97.7 F (36.5 C) (Temporal)   Resp 18   Ht 5\' 7"  (1.702 m)   Wt 266 lb 6.4 oz (120.8 kg)   LMP 02/02/2021   SpO2 100%   BMI 41.72 kg/m   Wt Readings from Last 3 Encounters:  02/21/21 260 lb (117.9 kg)  01/16/21 263 lb (119.3 kg)  01/02/21 266 lb (120.7 kg)    General: Appears her stated age, obese, in NAD. Skin: Folliculitis noted in between breasts. Cardiovascular: Normal rate. Pulmonary/Chest: Normal effort. Neurological: Alert and oriented.   BMET    Component Value Date/Time   NA 138 12/19/2020 1053   NA 140 09/20/2020 0844   K 4.3 12/19/2020 1053   CL 106 12/19/2020 1053   CO2 22 12/19/2020 1053   GLUCOSE 72 12/19/2020 1053   BUN 13 12/19/2020 1053   BUN 11 09/20/2020 0844   CREATININE 1.04 (H) 12/19/2020 1053   CALCIUM 9.3 12/19/2020 1053   GFRNONAA 80 09/28/2017 1005   GFRAA 92 09/28/2017 1005    Lipid Panel     Component Value Date/Time   CHOL 186 09/20/2020 0844   TRIG 66 09/20/2020 0844   HDL 47 09/20/2020 0844   CHOLHDL 3.1 09/28/2017 1005   LDLCALC 127 (H) 09/20/2020 0844    CBC    Component Value Date/Time   WBC 9.6 12/19/2020 1053   RBC 5.76 (H) 12/19/2020 1053   HGB 12.3 12/19/2020 1053   HGB 11.7 09/20/2020 0844   HCT 40.7 12/19/2020 1053   HCT 39.8 09/20/2020 0844   PLT 329 12/19/2020 1053   PLT 335 09/20/2020 0844   MCV 70.7 (L) 12/19/2020 1053   MCV 72 (L) 09/20/2020 0844   MCH 21.4 (L) 12/19/2020 1053   MCHC 30.2 (L) 12/19/2020 1053   RDW 16.4 (H) 12/19/2020 1053   RDW 18.1 (H) 09/20/2020 0844   LYMPHSABS 2.0 09/20/2020 0844   EOSABS 0.3 09/20/2020 0844   BASOSABS 0.0 09/20/2020 0844    Hgb A1C Lab Results  Component Value Date   HGBA1C 5.9 (H) 09/20/2020            Assessment & Plan:   Contact Dermatitis due to Cosmetics, Pruritus:  RX for Pred Taper x 6 days  Folliculitis:  RX for Bactroban 2 x day Try exfoliating  this area, pat dry or dry with a hair dryer  Return precautions discussed  Webb Silversmith, NP This visit occurred during the SARS-CoV-2 public health emergency.  Safety protocols were in place, including  screening questions prior to the visit, additional usage of staff PPE, and extensive cleaning of exam room while observing appropriate contact time as indicated for disinfecting solutions.

## 2021-03-04 NOTE — Patient Instructions (Signed)
Folliculitis Folliculitis is inflammation of the hair follicles. Folliculitis most commonly occurs on the scalp, thighs, legs, back, and buttocks. However, it can occur anywhere on the body. What are the causes? This condition may be caused by: A bacterial infection (common). A fungal infection. A viral infection. Contact with certain chemicals, especially oils and tars. Shaving or waxing. Greasy ointments or creams applied to the skin. Long-lasting folliculitis and folliculitis that keeps coming back may be caused by bacteria. This bacteria can live anywhere on your skin and is often found in the nostrils. What increases the risk? You are more likely to develop this condition if you have: A weakened immune system. Diabetes. Obesity. What are the signs or symptoms? Symptoms of this condition include: Redness. Soreness. Swelling. Itching. Small white or yellow, pus-filled, itchy spots (pustules) that appear over a reddened area. If there is an infection that goes deep into the follicle, these may develop into a boil (furuncle). A group of closely packed boils (carbuncle). These tend to form in hairy, sweaty areas of the body. How is this diagnosed? This condition is diagnosed with a skin exam. To find what is causing the condition, your health care provider may take a sample of one of the pustules or boils for testing in a lab. How is this treated? This condition may be treated by: Applying warm compresses to the affected areas. Taking an antibiotic medicine or applying an antibiotic medicine to the skin. Applying or bathing with an antiseptic solution. Taking an over-the-counter medicine to help with itching. Having a procedure to drain any pustules or boils. This may be done if a pustule or boil contains a lot of pus or fluid. Having laser hair removal. This may be done to treat long-lasting folliculitis. Follow these instructions at home: Managing pain and swelling  If  directed, apply heat to the affected area as often as told by your health care provider. Use the heat source that your health care provider recommends, such as a moist heat pack or a heating pad. Place a towel between your skin and the heat source. Leave the heat on for 20-30 minutes. Remove the heat if your skin turns bright red. This is especially important if you are unable to feel pain, heat, or cold. You may have a greater risk of getting burned. General instructions If you were prescribed an antibiotic medicine, take it or apply it as told by your health care provider. Do not stop using the antibiotic even if your condition improves. Check the irritated area every day for signs of infection. Check for: Redness, swelling, or pain. Fluid or blood. Warmth. Pus or a bad smell. Do not shave irritated skin. Take over-the-counter and prescription medicines only as told by your health care provider. Keep all follow-up visits as told by your health care provider. This is important. Get help right away if: You have more redness, swelling, or pain in the affected area. Red streaks are spreading from the affected area. You have a fever. Summary Folliculitis is inflammation of the hair follicles. Folliculitis most commonly occurs on the scalp, thighs, legs, back, and buttocks. This condition may be treated by taking an antibiotic medicine or applying an antibiotic medicine to the skin, and applying or bathing with an antiseptic solution. If you were prescribed an antibiotic medicine, take it or apply it as told by your health care provider. Do not stop using the antibiotic even if your condition improves. Get help right away if you have new or  worsening symptoms. Keep all follow-up visits as told by your health care provider. This is important. This information is not intended to replace advice given to you by your health care provider. Make sure you discuss any questions you have with your health  care provider. Document Revised: 10/09/2017 Document Reviewed: 10/17/2017 Elsevier Patient Education  [place].

## 2021-03-05 ENCOUNTER — Telehealth: Payer: Self-pay | Admitting: Internal Medicine

## 2021-03-05 NOTE — Telephone Encounter (Signed)
Pt needs alternative option for her medication, she says this is not covered. Needs either ointment version (not cream) or a different medication altogether.  mupirocin cream (BACTROBAN) 2 % [facility] in [place] on [address]

## 2021-03-06 MED ORDER — MUPIROCIN 2 % EX OINT: 1.0000 "application " | TOPICAL_OINTMENT | Freq: Two times a day (BID) | CUTANEOUS | 0 refills | Status: DC

## 2021-03-06 NOTE — Addendum Note (Signed)
Addended by: Jearld Fenton on: 03/06/2021 02:58 PM   Modules accepted: Orders

## 2021-03-06 NOTE — Telephone Encounter (Signed)
Ointment sent in in place of cream

## 2021-03-07 ENCOUNTER — Ambulatory Visit (INDEPENDENT_AMBULATORY_CARE_PROVIDER_SITE_OTHER): Payer: BC Managed Care – PPO | Admitting: Family Medicine

## 2021-03-15 ENCOUNTER — Encounter: Payer: Self-pay | Admitting: Internal Medicine

## 2021-03-21 ENCOUNTER — Other Ambulatory Visit: Payer: Self-pay | Admitting: Internal Medicine

## 2021-03-21 DIAGNOSIS — Z1231 Encounter for screening mammogram for malignant neoplasm of breast: Secondary | ICD-10-CM

## 2021-03-21 MED ORDER — CLOBETASOL PROPIONATE 0.05 % EX SOLN: 1.0000 "application " | Freq: Two times a day (BID) | CUTANEOUS | 0 refills | Status: DC

## 2021-03-23 ENCOUNTER — Telehealth: Payer: Self-pay | Admitting: Nurse Practitioner

## 2021-03-23 DIAGNOSIS — J0100 Acute maxillary sinusitis, unspecified: Secondary | ICD-10-CM

## 2021-03-23 MED ORDER — AMOXICILLIN-POT CLAVULANATE 875-125 MG PO TABS: 1.0000 | ORAL_TABLET | Freq: Two times a day (BID) | ORAL | 0 refills | Status: DC

## 2021-03-23 NOTE — Patient Instructions (Signed)
1. Take meds as prescribed 2. Use a cool mist humidifier especially during the winter months and when heat has been humid. 3. Use saline nose sprays frequently 4. Saline irrigations of the nose can be very helpful if done frequently.  * 4X daily for 1 week*  * Use of a nettie pot can be helpful with this. Follow directions with this* 5. Drink plenty of fluids 6. Keep thermostat turn down low 7.For any cough or congestion- dayquil and nihtquil 8. For fever or aces or pains- take tylenol or ibuprofen appropriate for age and weight.  * for fevers greater than 101 orally you may alternate ibuprofen and tylenol every  3 hours.

## 2021-03-23 NOTE — Progress Notes (Signed)
Virtual Visit Consent   Gabriella Riley, you are scheduled for a virtual visit with Gabriella Riley Done, FNP, a [facility] provider, today.     Just as with appointments in the office, your consent must be obtained to participate.  Your consent will be active for this visit and any virtual visit you may have with one of our providers in the next 365 days.     If you have a MyChart account, a copy of this consent can be sent to you electronically.  All virtual visits are billed to your insurance company just like a traditional visit in the office.    As this is a virtual visit, video technology does not allow for your provider to perform a traditional examination.  This may limit your provider's ability to fully assess your condition.  If your provider identifies any concerns that need to be evaluated in person or the need to arrange testing (such as labs, EKG, etc.), we will make arrangements to do so.     Although advances in technology are sophisticated, we cannot ensure that it will always work on either your end or our end.  If the connection with a video visit is poor, the visit may have to be switched to a telephone visit.  With either a video or telephone visit, we are not always able to ensure that we have a secure connection.     I need to obtain your verbal consent now.   Are you willing to proceed with your visit today? YES   [name] has provided verbal consent on 03/23/2021 for a virtual visit (video or telephone).   Gabriella Riley Done, FNP   Date: 03/23/2021 9:10 AM   Virtual Visit via Video Note   I, Gabriella Violette Morneault, connected with Gabriella Riley ([id number], Mar 19, 1976) on 03/23/21 at  9:15 AM EST by a video-enabled telemedicine application and verified that I am speaking with the correct person using two identifiers.  Location: Patient: Virtual Visit Location Patient: Home Provider: Virtual Visit Location Provider:  Mobile   I discussed the limitations of evaluation and management by telemedicine and the availability of in person appointments. The patient expressed understanding and agreed to proceed.    History of Present Illness: Gabriella Riley is a 45 y.o. who identifies as a female who was assigned female at birth, and is being seen today for respiratory issues.  HPI: URI  This is a new problem. The current episode started in the past 7 days. The problem has been gradually worsening. The maximum temperature recorded prior to her arrival was 100.4 - 100.9 F. The fever has been present for 1 to 2 days. Associated symptoms include congestion, coughing, ear pain, headaches, rhinorrhea, sinus pain, sneezing and a sore throat. She has tried decongestant, antihistamine and increased fluids for the symptoms. The treatment provided mild relief.   Review of Systems  HENT:  Positive for congestion, ear pain, rhinorrhea, sinus pain, sneezing and sore throat.   Respiratory:  Positive for cough.   Neurological:  Positive for headaches.   Problems:  Patient Active Problem List   Diagnosis Date Noted   Prediabetes 12/19/2020   HLD (hyperlipidemia) 12/19/2020   Morbid obesity ([place]) 12/19/2020   Essential hypertension 10/04/2019   Menorrhagia with regular cycle 02/04/2018   Arthritis of both knees 01/02/2015    Allergies:  Allergies  Allergen Reactions   Shellfish Allergy    Latex Rash   Medications:  Current Outpatient Medications:  acetaminophen (TYLENOL) 500 MG tablet, Take 500-1,000 mg by mouth every 6 (six) hours as needed (for pain.)., Disp: , Rfl:    clobetasol (TEMOVATE) 0.05 % external solution, Apply 1 application topically 2 (two) times daily., Disp: 50 mL, Rfl: 0   Multiple Vitamins-Minerals (ADULT GUMMY PO), Take 2 tablets by mouth daily., Disp: , Rfl:    mupirocin ointment (BACTROBAN) 2 %, Apply 1 application topically 2 (two) times daily., Disp: 22 g, Rfl: 0   predniSONE  (DELTASONE) 10 MG tablet, Take 6 tabs on day 1, 5 tabs on day 2, 4 tabs on day 3, 3 tabs on day 4, 2 tabs on day 5, 1 tab on day 6, Disp: 21 tablet, Rfl: 0   Vitamin D, Ergocalciferol, (DRISDOL) 1.25 MG (50000 UNIT) CAPS capsule, Take 1 capsule (50,000 Units total) by mouth every 7 (seven) days., Disp: 4 capsule, Rfl: 0  Observations/Objective: Patient is well-developed, well-nourished in no acute distress.  Resting comfortably  at home.  Head is normocephalic, atraumatic.  No labored breathing.  Speech is clear and coherent with logical content.  Patient is alert and oriented at baseline.  Raspy voice Cough dry and deep\ Sinus congestion noted  Assessment and Plan:  [employer] [employer] [place] in today with chief complaint of URI   1. Acute non-recurrent maxillary sinusitis 1. Take meds as prescribed 2. Use a cool mist humidifier especially during the winter months and when heat has been humid. 3. Use saline nose sprays frequently 4. Saline irrigations of the nose can be very helpful if done frequently.  * 4X daily for 1 week*  * Use of a nettie pot can be helpful with this. Follow directions with this* 5. Drink plenty of fluids 6. Keep thermostat turn down low 7.For any cough or congestion- dayquil and nightquil 8. For fever or aces or pains- take tylenol or ibuprofen appropriate for age and weight.  * for fevers greater than 101 orally you may alternate ibuprofen and tylenol every  3 hours.    Meds ordered this encounter  Medications   amoxicillin-clavulanate (AUGMENTIN) 875-125 MG tablet    Sig: Take 1 tablet by mouth 2 (two) times daily.    Dispense:  14 tablet    Refill:  0    Order Specific Question:   Supervising Provider    Answer:   Noemi Chapel [[id number]]     Follow Up Instructions: I discussed the assessment and treatment plan with the patient. The patient was provided an opportunity to ask questions and all were answered. The patient agreed with the plan and  demonstrated an understanding of the instructions.  A copy of instructions were sent to the patient via MyChart.  The patient was advised to call back or seek an in-person evaluation if the symptoms worsen or if the condition fails to improve as anticipated.  Time:  I spent 7 minutes with the patient via telehealth technology discussing the above problems/concerns.    Gabriella Riley Done, FNP

## 2021-03-26 ENCOUNTER — Other Ambulatory Visit: Payer: Self-pay

## 2021-03-26 ENCOUNTER — Other Ambulatory Visit: Payer: Self-pay | Admitting: Nurse Practitioner

## 2021-03-26 ENCOUNTER — Ambulatory Visit (INDEPENDENT_AMBULATORY_CARE_PROVIDER_SITE_OTHER): Payer: BC Managed Care – PPO | Admitting: Family Medicine

## 2021-03-26 ENCOUNTER — Encounter (INDEPENDENT_AMBULATORY_CARE_PROVIDER_SITE_OTHER): Payer: Self-pay | Admitting: Family Medicine

## 2021-03-26 VITALS — BP 131/89 | HR 74 | Temp 98.9°F | Ht 67.0 in | Wt 260.0 lb

## 2021-03-26 DIAGNOSIS — Z6841 Body Mass Index (BMI) 40.0 and over, adult: Secondary | ICD-10-CM | POA: Diagnosis not present

## 2021-03-26 DIAGNOSIS — E559 Vitamin D deficiency, unspecified: Secondary | ICD-10-CM

## 2021-03-26 DIAGNOSIS — Z9189 Other specified personal risk factors, not elsewhere classified: Secondary | ICD-10-CM | POA: Diagnosis not present

## 2021-03-26 DIAGNOSIS — R7303 Prediabetes: Secondary | ICD-10-CM | POA: Diagnosis not present

## 2021-03-26 MED ORDER — TIRZEPATIDE 5 MG/0.5ML ~~LOC~~ SOAJ: 5.0000 mg | SUBCUTANEOUS | 0 refills | Status: DC

## 2021-03-26 MED ORDER — FLUCONAZOLE 150 MG PO TABS: 150.0000 mg | ORAL_TABLET | Freq: Once | ORAL | 0 refills | Status: AC

## 2021-03-27 NOTE — Progress Notes (Signed)
Chief Complaint:   OBESITY Gabriella Riley is here to discuss her progress with her obesity treatment plan along with follow-up of her obesity related diagnoses. Gabriella Riley is on keeping a food journal and adhering to recommended goals of 1400-1600 calories and 90+ grams of protein daily and states she is following her eating plan approximately 30% of the time. Gabriella Riley states she is walking for 30 minutes 7 times per week.  Today's visit was #: 10 Starting weight: 270 lbs Starting date: 09/20/2020 Today's weight: 260 lbs Today's date: 03/26/2021 Total lbs lost to date: 10 Total lbs lost since last in-office visit: 0  Interim History: Gabriella Riley has done well with maintaining her weight over the holidays. She is walking outside for exercise most days of the week, but she is ready to start some strengthening exercise at the gym.  Subjective:   1. Pre-diabetes Gabriella Riley started Mounjaro at 5 mg, but she had GI upset so we decreased her dose to 2.5 mg. She is tolerating this dose, but it has minimal affect on her polyphagia.   2. Vitamin D deficiency Gabriella Riley is on Vit D with no side effects noted. Her level is not yet at goal.   3. At risk for impaired metabolic function Gabriella Riley is at increased risk for impaired metabolic function due to current nutrition and muscle mass.  Assessment/Plan:   1. Pre-diabetes Gabriella Riley agreed to increase Mounjaro to 5 mg weekly, and we will refill for 1 month. She will continue diet and exercise, and we will follow up at her next visit.   - tirzepatide [facility]) 5 MG/0.5ML Pen; Inject 5 mg into the skin once a week.  Dispense: 2 mL; Refill: 0  2. Vitamin D deficiency Gabriella Riley will continue prescription Vitamin D 50,000 IU every week and will follow-up for routine testing of Vitamin D, at least 2-3 times per year to avoid over-replacement.  3. At risk for impaired metabolic function Gabriella Riley was given approximately 15 minutes of impaired  metabolic function prevention  counseling today. We discussed intensive lifestyle modifications today with an emphasis on specific nutrition and exercise instructions and strategies.   Repetitive spaced learning was employed today to elicit superior memory formation and behavioral change.  4. Obesity with current BMI of 40.8 Gabriella Riley is currently in the action stage of change. As such, her goal is to continue with weight loss efforts. She has agreed to the Category 3 Plan or keeping a food journal and adhering to recommended goals of 1400-1600 calories and 90+ grams of protein daily.   Exercise goals: As is, and add strengthening exercises.  Behavioral modification strategies: increasing lean protein intake and meal planning and cooking strategies.  Gabriella Riley has agreed to follow-up with our clinic in 3 weeks. She was informed of the importance of frequent follow-up visits to maximize her success with intensive lifestyle modifications for her multiple health conditions.   Objective:   Blood pressure 131/89, pulse 74, temperature 98.9 F (37.2 C), temperature source Oral, height 5\' 7"  (1.702 m), weight 260 lb (117.9 kg), SpO2 100 %. Body mass index is 40.72 kg/m.  General: Cooperative, alert, well developed, in no acute distress. HEENT: Conjunctivae and lids unremarkable. Cardiovascular: Regular rhythm.  Lungs: Normal work of breathing. Neurologic: No focal deficits.   Lab Results  Component Value Date   CREATININE 1.04 (H) 12/19/2020   BUN 13 12/19/2020   NA 138 12/19/2020   K 4.3 12/19/2020   CL 106 12/19/2020   CO2 22 12/19/2020  Lab Results  Component Value Date   ALT 9 12/19/2020   AST 14 12/19/2020   ALKPHOS 89 09/20/2020   BILITOT 0.4 12/19/2020   Lab Results  Component Value Date   HGBA1C 5.9 (H) 09/20/2020   HGBA1C 5.6 05/25/2018   Lab Results  Component Value Date   INSULIN 11.1 09/20/2020   Lab Results  Component Value Date   TSH 0.965 09/20/2020   Lab Results  Component Value Date    CHOL 186 09/20/2020   HDL 47 09/20/2020   LDLCALC 127 (H) 09/20/2020   TRIG 66 09/20/2020   CHOLHDL 3.1 09/28/2017   Lab Results  Component Value Date   VD25OH 16.9 (L) 09/20/2020   VD25OH 11.3 (L) 09/28/2017   Lab Results  Component Value Date   WBC 9.6 12/19/2020   HGB 12.3 12/19/2020   HCT 40.7 12/19/2020   MCV 70.7 (L) 12/19/2020   PLT 329 12/19/2020   Lab Results  Component Value Date   IRON 25 (L) 09/20/2020   TIBC 335 09/20/2020   FERRITIN 34 09/20/2020   Attestation Statements:   Reviewed by clinician on day of visit: allergies, medications, problem list, medical history, surgical history, family history, social history, and previous encounter notes.   I, Trixie Dredge, am acting as transcriptionist for Dennard Nip, MD.  I have reviewed the above documentation for accuracy and completeness, and I agree with the above. -  Dennard Nip, MD

## 2021-04-02 ENCOUNTER — Ambulatory Visit: Payer: Self-pay | Admitting: Obstetrics & Gynecology

## 2021-04-15 IMAGING — MG MM DIGITAL SCREENING BILAT W/ TOMO AND CAD
8 series · 8 of 24 positions shown · non-contrast
Comparison: Previous exam(s).

CLINICAL DATA: Screening.

EXAM:
DIGITAL SCREENING BILATERAL MAMMOGRAM WITH TOMO AND CAD

[L CC synth-2D]
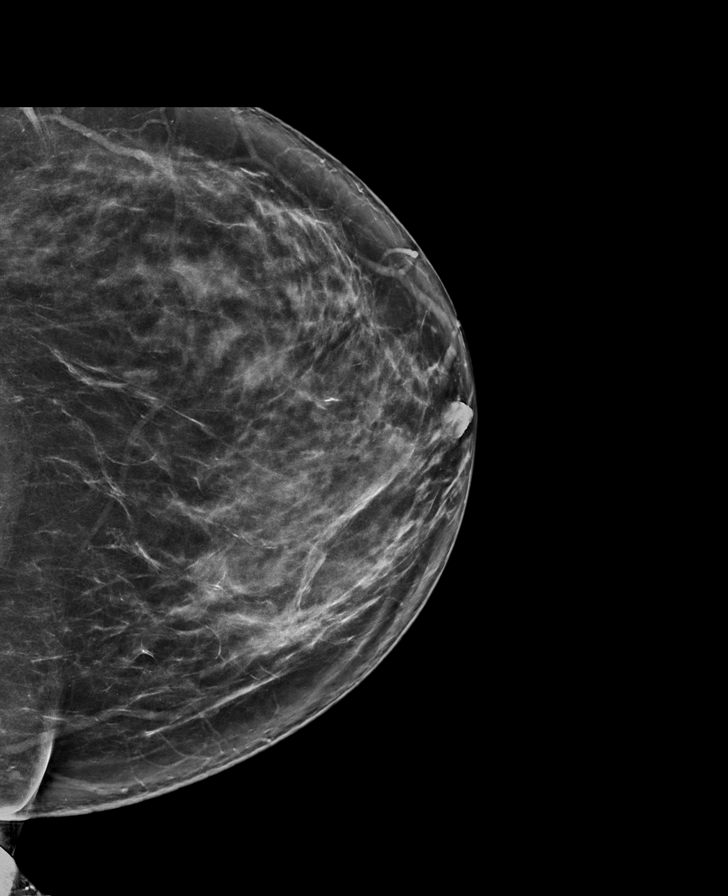

[R MLO synth-2D]
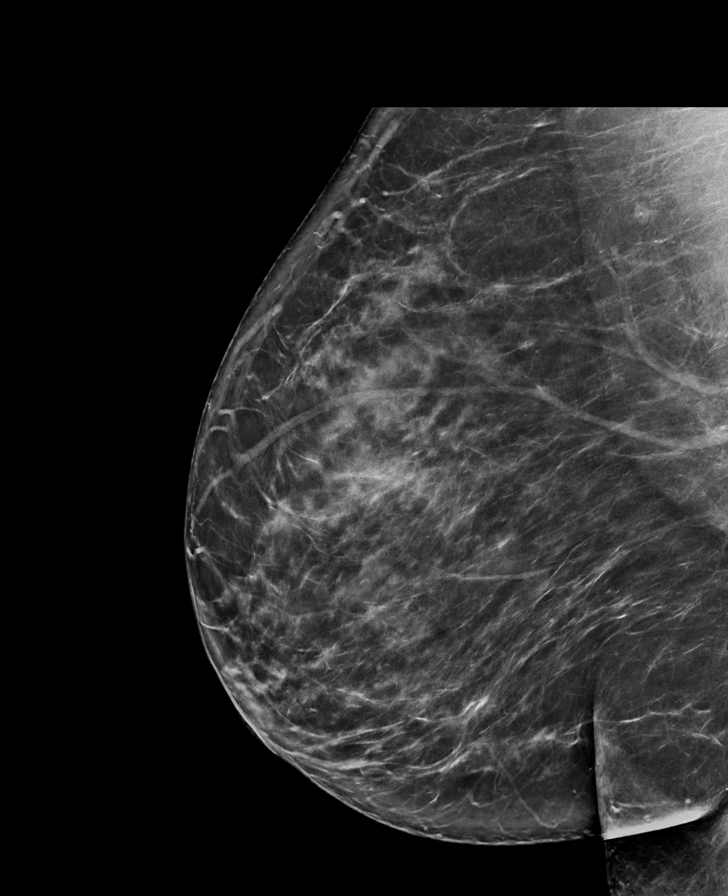

[R CC synth-2D]
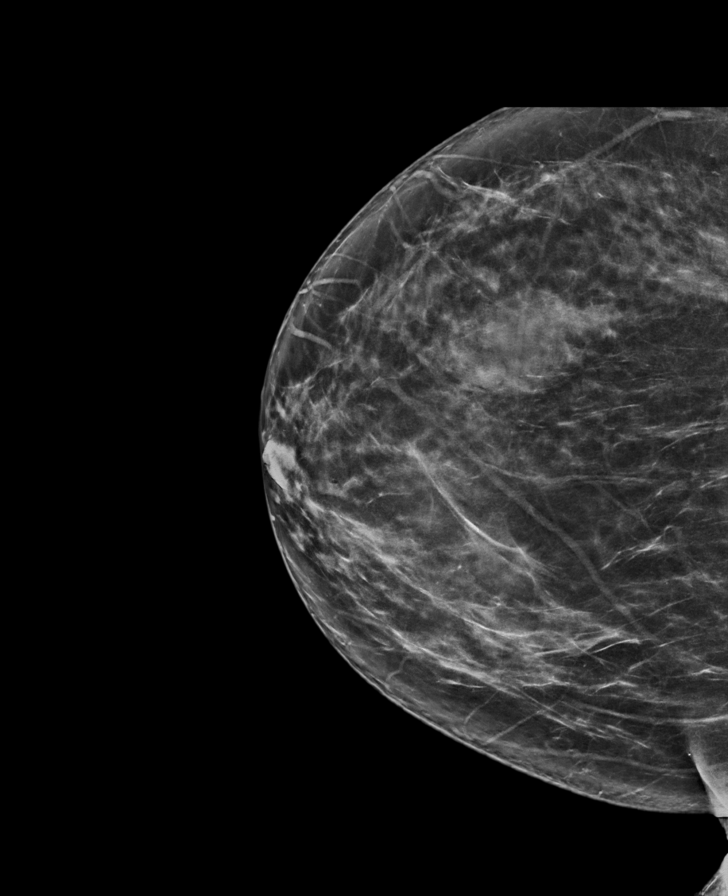

[L MLO synth-2D]
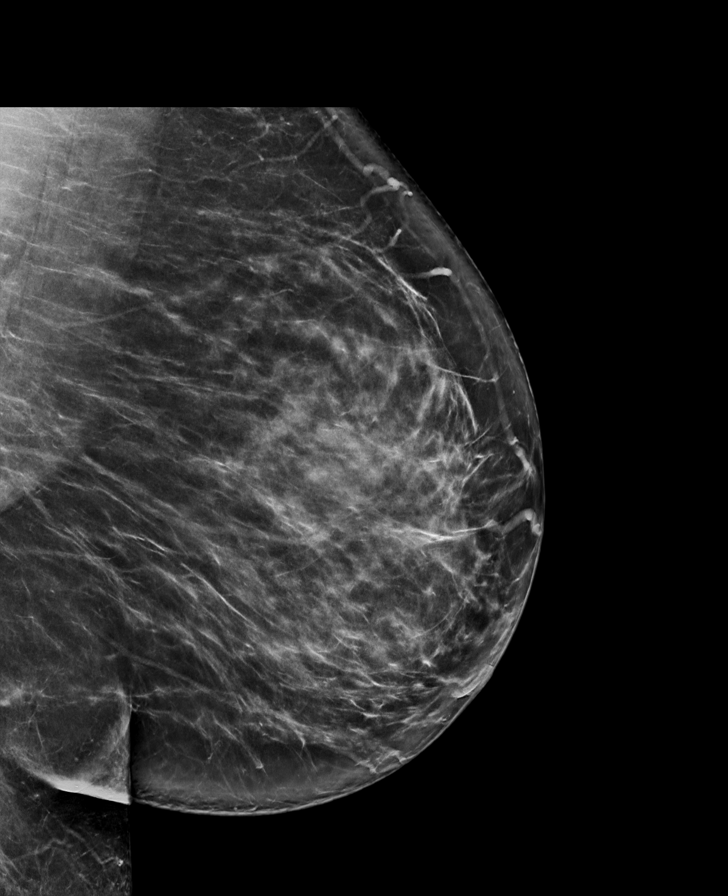

[L MLO tomo · tomo slice 45/89.0]
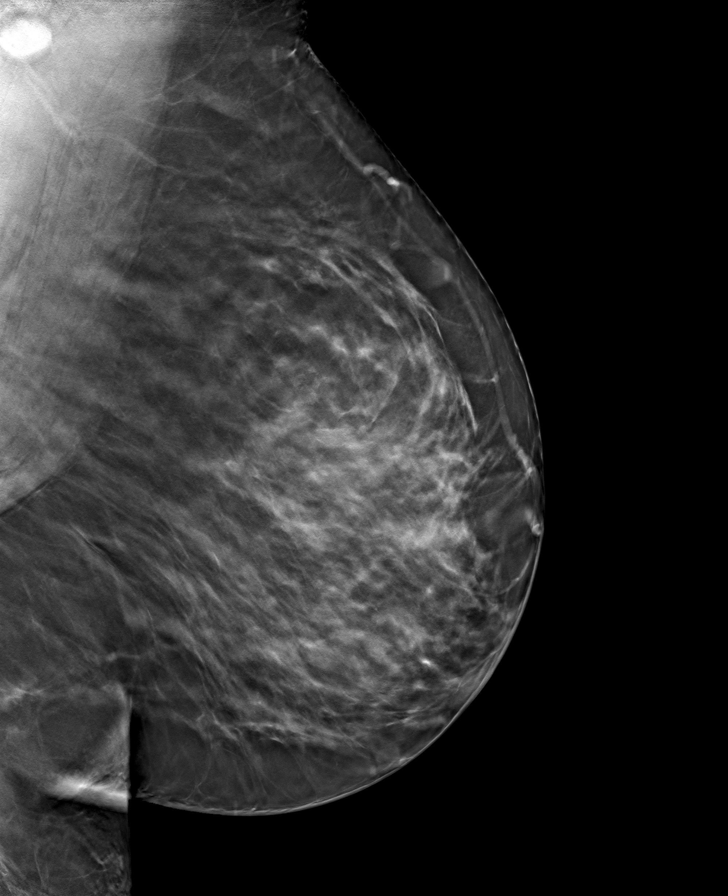

[R MLO tomo · tomo slice 45/90.0]
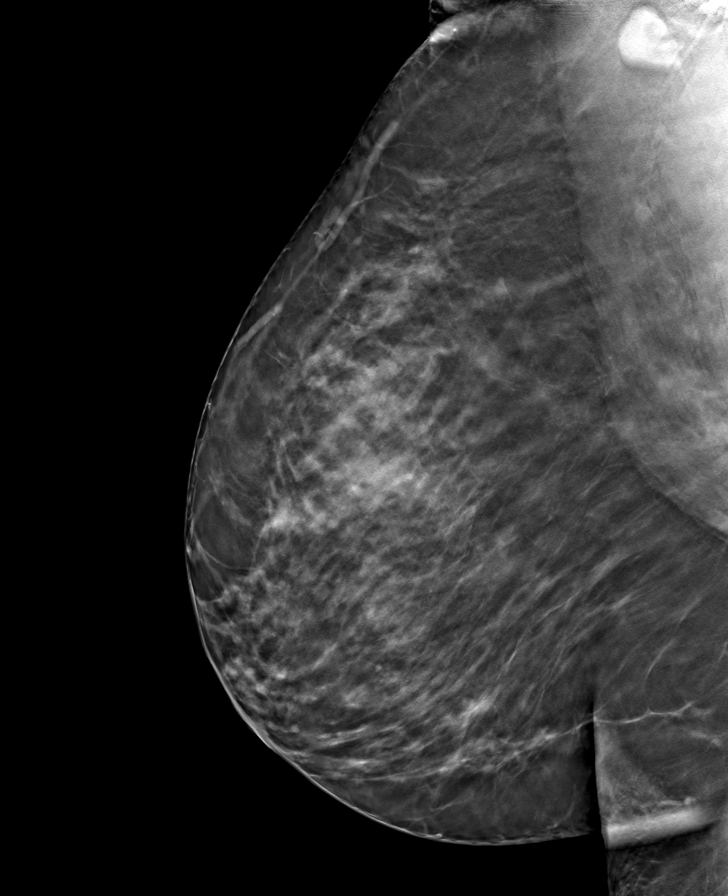

[L CC tomo · tomo slice 45/88.0]
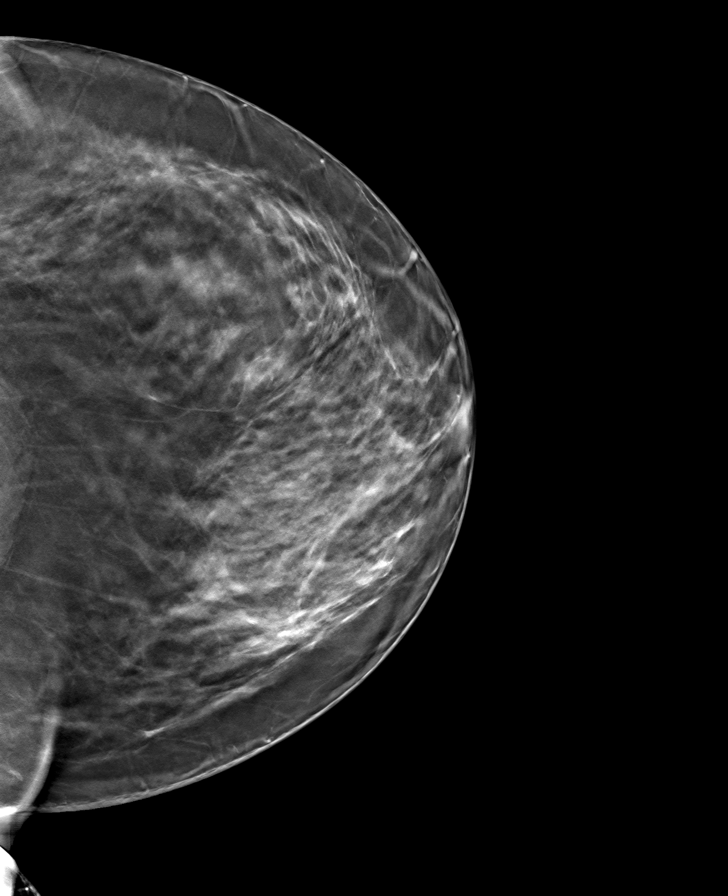

[R CC tomo · tomo slice 42/83.0]
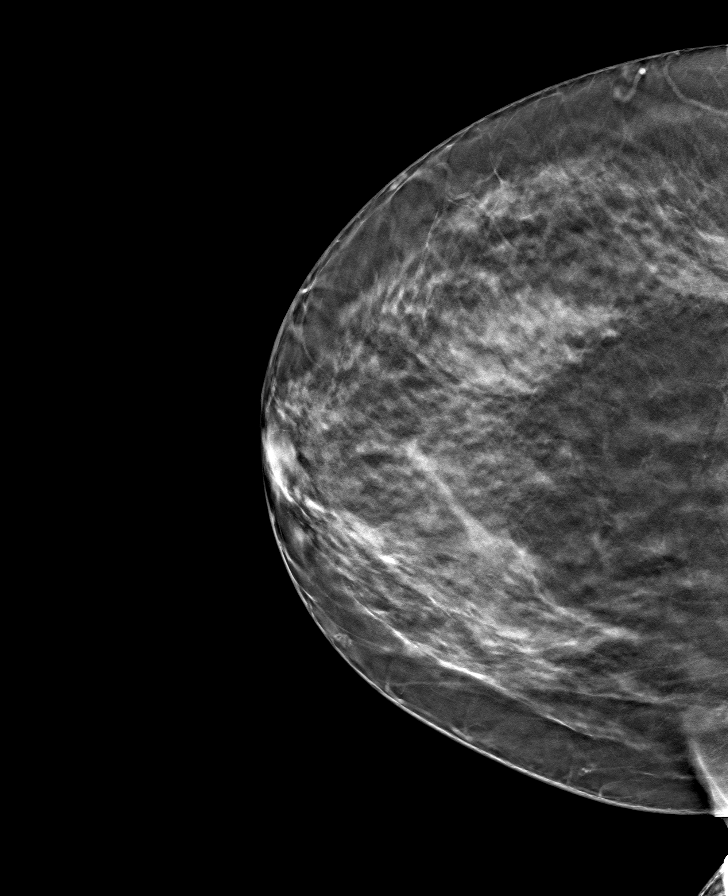

[8 of 24 positions shown; findings below may reference images not displayed]

ACR Breast Density Category c: The breast tissue is heterogeneously
dense, which may obscure small masses.
FINDINGS: There are no findings suspicious for malignancy. The images were
evaluated with computer-aided detection.
IMPRESSION: No mammographic evidence of malignancy. A result letter of this
screening mammogram will be mailed directly to the patient.

RECOMMENDATION:
Screening mammogram in one year. (Code:JF-W-WVL)

BI-RADS CATEGORY  1: Negative.

## 2021-04-16 ENCOUNTER — Ambulatory Visit (INDEPENDENT_AMBULATORY_CARE_PROVIDER_SITE_OTHER): Payer: BC Managed Care – PPO | Admitting: Family Medicine

## 2021-04-16 ENCOUNTER — Encounter (INDEPENDENT_AMBULATORY_CARE_PROVIDER_SITE_OTHER): Payer: Self-pay | Admitting: Family Medicine

## 2021-04-16 ENCOUNTER — Other Ambulatory Visit: Payer: Self-pay

## 2021-04-16 VITALS — BP 136/91 | HR 83 | Temp 98.7°F | Ht 67.0 in | Wt 263.0 lb

## 2021-04-16 DIAGNOSIS — Z9189 Other specified personal risk factors, not elsewhere classified: Secondary | ICD-10-CM | POA: Diagnosis not present

## 2021-04-16 DIAGNOSIS — E669 Obesity, unspecified: Secondary | ICD-10-CM | POA: Diagnosis not present

## 2021-04-16 DIAGNOSIS — R7303 Prediabetes: Secondary | ICD-10-CM | POA: Diagnosis not present

## 2021-04-16 DIAGNOSIS — Z6841 Body Mass Index (BMI) 40.0 and over, adult: Secondary | ICD-10-CM

## 2021-04-16 DIAGNOSIS — E559 Vitamin D deficiency, unspecified: Secondary | ICD-10-CM

## 2021-04-16 MED ORDER — TIRZEPATIDE 7.5 MG/0.5ML ~~LOC~~ SOAJ: 7.5000 mg | SUBCUTANEOUS | 0 refills | Status: DC

## 2021-04-16 MED ORDER — VITAMIN D (ERGOCALCIFEROL) 1.25 MG (50000 UNIT) PO CAPS: 50000.0000 [IU] | ORAL_CAPSULE | ORAL | 0 refills | Status: DC

## 2021-04-17 ENCOUNTER — Ambulatory Visit
Admission: RE | Admit: 2021-04-17 | Discharge: 2021-04-17 | Disposition: A | Discharge status: Home / Self Care | Payer: BC Managed Care – PPO | Source: Ambulatory Visit | Attending: Internal Medicine | Admitting: Internal Medicine

## 2021-04-17 DIAGNOSIS — Z1231 Encounter for screening mammogram for malignant neoplasm of breast: Secondary | ICD-10-CM

## 2021-04-17 NOTE — Progress Notes (Signed)
Chief Complaint:   OBESITY Gabriella Riley is here to discuss her progress with her obesity treatment plan along with follow-up of her obesity related diagnoses. Gabriella Riley is on the Category 3 Plan or keeping a food journal and adhering to recommended goals of 1400-1600 calories and 90+ grams of protein daily and states she is following her eating plan approximately 100% of the time. Gabriella Riley states she is at the gym/treadmill for 30-+45 minutes 2 times per week.  Today's visit was #: 11 Starting weight: 270 lbs Starting date: 09/20/2020 Today's weight: 262 lbs Today's date: 04/16/2021 Total lbs lost to date: 8 Total lbs lost since last in-office visit: 0  Interim History: Gabriella Riley has been working on getting back on track with her eating plan after the holidays. She notes some increase in polyphagia.  Subjective:   1. Pre-diabetes Gabriella Riley notes increased polyphagia on Mounjaro recently.   2. Vitamin D deficiency Gabriella Riley is stable on Vit D, and she requests a refill.  3. At risk for diabetes mellitus Gabriella Riley is at higher than average risk for developing diabetes due to obesity.   Assessment/Plan:   1. Pre-diabetes Gabriella Riley agreed to increase Mounjaro to 7.5 mg weekly with no refills. She will work on making sure she is not sabotaging her efforts.  - tirzepatide (MOUNJARO) 7.5 MG/0.5ML Pen; Inject 7.5 mg into the skin once a week.  Dispense: 6 mL; Refill: 0  2. Vitamin D deficiency We will refill prescription Vitamin D 50,000 IU every week for 1 month. Gabriella Riley will follow-up for routine testing of Vitamin D, at least 2-3 times per year to avoid over-replacement.  - Vitamin D, Ergocalciferol, (DRISDOL) 1.25 MG (50000 UNIT) CAPS capsule; Take 1 capsule (50,000 Units total) by mouth every 7 (seven) days.  Dispense: 4 capsule; Refill: 0  3. At risk for diabetes mellitus Good blood sugar control is important to decrease the likelihood of diabetic complications such as nephropathy, neuropathy, limb  loss, blindness, coronary artery disease, and death. Intensive lifestyle modification including diet, exercise and weight loss are the first line of treatment for diabetes.   4. Obesity with current BMI of 41.1 Gabriella Riley is currently in the action stage of change. As such, her goal is to continue with weight loss efforts. She has agreed to the Category 3 Plan and keeping a food journal and adhering to recommended goals of 350-550 calories and 35 grams of protein at supper daily.   Exercise goals: As is.  Behavioral modification strategies: increasing lean protein intake and meal planning and cooking strategies.  Gabriella Riley has agreed to follow-up with our clinic in 3 to 4 weeks. She was informed of the importance of frequent follow-up visits to maximize her success with intensive lifestyle modifications for her multiple health conditions.   Objective:   Blood pressure (!) 136/91, pulse 83, temperature 98.7 F (37.1 C), height 5\' 7"  (1.702 m), weight 263 lb (119.3 kg), SpO2 97 %. Body mass index is 41.19 kg/m.  General: Cooperative, alert, well developed, in no acute distress. HEENT: Conjunctivae and lids unremarkable. Cardiovascular: Regular rhythm.  Lungs: Normal work of breathing. Neurologic: No focal deficits.   Lab Results  Component Value Date   CREATININE 1.04 (H) 12/19/2020   BUN 13 12/19/2020   NA 138 12/19/2020   K 4.3 12/19/2020   CL 106 12/19/2020   CO2 22 12/19/2020   Lab Results  Component Value Date   ALT 9 12/19/2020   AST 14 12/19/2020   ALKPHOS 89 09/20/2020  BILITOT 0.4 12/19/2020   Lab Results  Component Value Date   HGBA1C 5.9 (H) 09/20/2020   HGBA1C 5.6 05/25/2018   Lab Results  Component Value Date   INSULIN 11.1 09/20/2020   Lab Results  Component Value Date   TSH 0.965 09/20/2020   Lab Results  Component Value Date   CHOL 186 09/20/2020   HDL 47 09/20/2020   LDLCALC 127 (H) 09/20/2020   TRIG 66 09/20/2020   CHOLHDL 3.1 09/28/2017   Lab  Results  Component Value Date   VD25OH 16.9 (L) 09/20/2020   VD25OH 11.3 (L) 09/28/2017   Lab Results  Component Value Date   WBC 9.6 12/19/2020   HGB 12.3 12/19/2020   HCT 40.7 12/19/2020   MCV 70.7 (L) 12/19/2020   PLT 329 12/19/2020   Lab Results  Component Value Date   IRON 25 (L) 09/20/2020   TIBC 335 09/20/2020   FERRITIN 34 09/20/2020   Attestation Statements:   Reviewed by clinician on day of visit: allergies, medications, problem list, medical history, surgical history, family history, social history, and previous encounter notes.   I, Trixie Dredge, am acting as transcriptionist for Dennard Nip, MD.  I have reviewed the above documentation for accuracy and completeness, and I agree with the above. -  Dennard Nip, MD

## 2021-04-22 ENCOUNTER — Other Ambulatory Visit (INDEPENDENT_AMBULATORY_CARE_PROVIDER_SITE_OTHER): Payer: Self-pay | Admitting: Family Medicine

## 2021-04-22 ENCOUNTER — Encounter (INDEPENDENT_AMBULATORY_CARE_PROVIDER_SITE_OTHER): Payer: Self-pay | Admitting: Family Medicine

## 2021-04-22 DIAGNOSIS — R7303 Prediabetes: Secondary | ICD-10-CM

## 2021-04-22 MED ORDER — TIRZEPATIDE 7.5 MG/0.5ML ~~LOC~~ SOAJ: 7.5000 mg | SUBCUTANEOUS | 0 refills | Status: DC

## 2021-04-22 NOTE — Telephone Encounter (Signed)
Dr.Beasley 

## 2021-05-07 ENCOUNTER — Ambulatory Visit (INDEPENDENT_AMBULATORY_CARE_PROVIDER_SITE_OTHER): Payer: BC Managed Care – PPO | Admitting: Family Medicine

## 2021-05-27 ENCOUNTER — Other Ambulatory Visit: Payer: Self-pay

## 2021-05-27 ENCOUNTER — Ambulatory Visit (INDEPENDENT_AMBULATORY_CARE_PROVIDER_SITE_OTHER): Payer: BC Managed Care – PPO | Admitting: Family Medicine

## 2021-05-27 VITALS — BP 140/94 | HR 75 | Temp 97.9°F | Ht 67.0 in | Wt 258.0 lb

## 2021-05-27 DIAGNOSIS — E669 Obesity, unspecified: Secondary | ICD-10-CM | POA: Diagnosis not present

## 2021-05-27 DIAGNOSIS — R7303 Prediabetes: Secondary | ICD-10-CM

## 2021-05-27 DIAGNOSIS — E559 Vitamin D deficiency, unspecified: Secondary | ICD-10-CM

## 2021-05-27 DIAGNOSIS — I10 Essential (primary) hypertension: Secondary | ICD-10-CM

## 2021-05-27 DIAGNOSIS — Z6841 Body Mass Index (BMI) 40.0 and over, adult: Secondary | ICD-10-CM

## 2021-05-27 MED ORDER — TIRZEPATIDE 7.5 MG/0.5ML ~~LOC~~ SOAJ: 7.5000 mg | SUBCUTANEOUS | 0 refills | Status: DC

## 2021-05-27 MED ORDER — VITAMIN D (ERGOCALCIFEROL) 1.25 MG (50000 UNIT) PO CAPS: 50000.0000 [IU] | ORAL_CAPSULE | ORAL | 0 refills | Status: DC

## 2021-05-27 MED ORDER — CHLORTHALIDONE 25 MG PO TABS: 12.5000 mg | ORAL_TABLET | Freq: Every day | ORAL | 0 refills | Status: DC

## 2021-05-28 ENCOUNTER — Ambulatory Visit (INDEPENDENT_AMBULATORY_CARE_PROVIDER_SITE_OTHER): Payer: BC Managed Care – PPO | Admitting: Obstetrics & Gynecology

## 2021-05-28 ENCOUNTER — Encounter: Payer: Self-pay | Admitting: Obstetrics & Gynecology

## 2021-05-28 ENCOUNTER — Other Ambulatory Visit (HOSPITAL_COMMUNITY)
Admission: RE | Admit: 2021-05-28 | Discharge: 2021-05-28 | Disposition: A | Discharge status: Home / Self Care | Payer: BC Managed Care – PPO | Source: Ambulatory Visit | Attending: Obstetrics & Gynecology | Admitting: Obstetrics & Gynecology

## 2021-05-28 VITALS — BP 161/104 | HR 73 | Wt 262.0 lb

## 2021-05-28 DIAGNOSIS — B3731 Acute candidiasis of vulva and vagina: Secondary | ICD-10-CM | POA: Insufficient documentation

## 2021-05-28 DIAGNOSIS — N898 Other specified noninflammatory disorders of vagina: Secondary | ICD-10-CM | POA: Diagnosis present

## 2021-05-28 DIAGNOSIS — Z1211 Encounter for screening for malignant neoplasm of colon: Secondary | ICD-10-CM

## 2021-05-28 DIAGNOSIS — Z01419 Encounter for gynecological examination (general) (routine) without abnormal findings: Secondary | ICD-10-CM | POA: Diagnosis present

## 2021-05-28 DIAGNOSIS — Z975 Presence of (intrauterine) contraceptive device: Secondary | ICD-10-CM | POA: Insufficient documentation

## 2021-05-28 NOTE — Progress Notes (Signed)
? ? ?GYNECOLOGY ANNUAL PREVENTATIVE CARE ENCOUNTER NOTE ? ?History:    ? Gabriella Riley is a 46 y.o. [phone] female here for a routine annual gynecologic exam.  Current complaints: itchy vaginal discharge for a few days, wants full testing done.   Denies abnormal vaginal bleeding, discharge, pelvic pain, problems with intercourse or other gynecologic concerns.  ?  ?Gynecologic History ?No LMP recorded. (Menstrual status: IUD). ?Contraception: Mirena IUD, placed 01/20/2020 ?Last Pap: 03/13/2020. Result was normal with negative HPV ?Last Mammogram: 04/17/2021.  Result was normal ? ?Obstetric History ?OB History  ?Gravida Para Term Preterm AB Living  ?'4 4 4 '$ 0 0 4  ?SAB IAB Ectopic Multiple Live Births  ?0 0 0 0 4  ?  ?# Outcome Date GA Lbr Len/2nd Weight Sex Delivery Anes PTL Lv  ?4 Term 08/22/10 [redacted]w[redacted]d 7 lb 1 oz (3.204 kg) F Vag-Spont   LIV  ?   Complications: Pre-eclampsia  ?3 Term 05/14/09 428w0d8 lb 3 oz (3.714 kg) M Vag-Spont   LIV  ?   Complications: Pre-eclampsia  ?2 Term 06/26/00 4062w0d lb 11 oz (3.033 kg) M Vag-Spont   LIV  ?1 Term 07/26/95 [id number] 9 oz (3.43 kg) F Vag-Spont   LIV  ? ? ?Past Medical History:  ?Diagnosis Date  ? Anemia   ? Arthritis   ? Back pain   ? Bilateral swelling of feet and ankles   ? Constipation   ? Food allergy   ? GERD (gastroesophageal reflux disease)   ? History of blood clots   ? Hyperlipidemia   ? Hypertension   ? Joint pain   ? Lactose intolerance   ? Other fatigue   ? Shortness of breath   ? Shortness of breath on exertion   ? Vitamin D deficiency   ? ? ?Past Surgical History:  ?Procedure Laterality Date  ? BREAST BIOPSY Left 2007  ? BREAST EXCISIONAL BIOPSY    ? TUBAL LIGATION    ? ? ?Current Outpatient Medications on File Prior to Visit  ?Medication Sig Dispense Refill  ? acetaminophen (TYLENOL) 500 MG tablet Take 500-1,000 mg by mouth every 6 (six) hours as needed (for pain.).    ? chlorthalidone (HYGROTON) 25 MG tablet Take 0.5 tablets (12.5 mg total) by mouth  daily. 15 tablet 0  ? clobetasol (TEMOVATE) 0.05 % external solution Apply 1 application topically 2 (two) times daily. 50 mL 0  ? Multiple Vitamins-Minerals (ADULT GUMMY PO) Take 2 tablets by mouth daily.    ? tirzepatide (MOUNJARO) 7.5 MG/0.5ML Pen Inject 7.5 mg into the skin once a week. 6 mL 0  ? Vitamin D, Ergocalciferol, (DRISDOL) 1.25 MG (50000 UNIT) CAPS capsule Take 1 capsule (50,000 Units total) by mouth every 7 (seven) days. 4 capsule 0  ? ?No current facility-administered medications on file prior to visit.  ? ? ?Allergies  ?Allergen Reactions  ? Shellfish Allergy   ? Latex Rash  ? ? ?Social History:  reports that she has never smoked. She has never used smokeless tobacco. She reports current alcohol use. She reports that she does not use drugs. ? ?Family History  ?Problem Relation Age of Onset  ? Obesity Father   ? Sleep apnea Father   ? Heart disease Father   ? Diabetes Father   ? Hyperlipidemia Father   ? Hypertension Father   ? Heart failure Father   ? Arthritis Maternal Grandmother   ? Heart disease Other   ?  Sleep apnea Other   ? Obesity Other   ? ? ?The following portions of the patient's history were reviewed and updated as appropriate: allergies, current medications, past family history, past medical history, past social history, past surgical history and problem list. ? ?Review of Systems ?Pertinent items noted in HPI and remainder of comprehensive ROS otherwise negative. ? ?Physical Exam:  ?BP (!) 161/104   Pulse 73   Wt 262 lb (118.8 kg)   BMI 41.04 kg/m?  ?CONSTITUTIONAL: Well-developed, well-nourished female in no acute distress.  ?HENT:  Normocephalic, atraumatic, External right and left ear normal.  ?EYES: Conjunctivae and EOM are normal. Pupils are equal, round, and reactive to light. No scleral icterus.  ?NECK: Normal range of motion, supple, no masses.  Normal thyroid.  ?SKIN: Skin is warm and dry. No rash noted. Not diaphoretic. No erythema. No pallor. ?MUSCULOSKELETAL: Normal  range of motion. No tenderness.  No cyanosis, clubbing, or edema. ?NEUROLOGIC: Alert and oriented to person, place, and time. Normal reflexes, muscle tone coordination.  ?PSYCHIATRIC: Normal mood and affect. Normal behavior. Normal judgment and thought content. ?CARDIOVASCULAR: Normal heart rate noted, regular rhythm ?RESPIRATORY: Clear to auscultation bilaterally. Effort and breath sounds normal, no problems with respiration noted. ?BREASTS: Symmetric in size. No masses, tenderness, skin changes, nipple drainage, or lymphadenopathy bilaterally. Performed in the presence of a chaperone. ?ABDOMEN: Soft, no distention noted.  No tenderness, rebound or guarding.  ?PELVIC: Normal appearing external genitalia and urethral meatus; normal appearing vaginal mucosa and cervix.  Scant yellow vaginal discharge noted, testing sample obtained. IUD strings seen about 3 cm in length outside cervix.  Pap smear obtained, very friable ectocervix, ameliorated by application of silver nitrate.  Normal uterine size, no other palpable masses, no uterine or adnexal tenderness.  Performed in the presence of a chaperone. ?  ?Assessment and Plan:  ?   ?1. Mirena IUD (intrauterine device) in place since 01/20/2020 ?No significant concerns. Placed for AUB.  ? ?2. Vaginal discharge ?- Cervicovaginal ancillary only done, will follow up results and manage accordingly. ? ?3. Encounter for screening colonoscopy ?Will be 46 soon, referral made to Gastroenterology for colonoscopy ?- Ambulatory referral to Gastroenterology ? ?4. Well woman exam with routine gynecological exam ?- Cytology - PAP ?Will follow up results of pap smear and manage accordingly. ?Mammogram is up to date ?Routine preventative health maintenance measures emphasized. ?Please refer to After Visit Summary for other counseling recommendations.  ?   ? ?Verita Schneiders, MD, FACOG ?Obstetrician Social research officer, government, Faculty Practice ?[facility] ? ?

## 2021-05-29 NOTE — Progress Notes (Signed)
? ? ? ?Chief Complaint:  ? ?OBESITY ?Gabriella Riley is here to discuss her progress with her obesity treatment plan along with follow-up of her obesity related diagnoses. Gabriella Riley is on the Category 3 Plan and keeping a food journal and adhering to recommended goals of 350-550 calories and 35 grams of protein at supper daily and states she is following her eating plan approximately 50% of the time. Gabriella Riley states she is walking for 35 minutes 2 times per week. ? ?Today's visit was #: 12 ?Starting weight: 270 lbs ?Starting date: 09/20/2020 ?Today's weight: 258 lbs ?Today's date: 05/27/2021 ?Total lbs lost to date: 12 ?Total lbs lost since last in-office visit: 5 ? ?Interim History: Gabriella Riley continues to do well with weight loss. Her hunger is controlled and she is skipping some meals at times. She will be traveling to [facility] for Spring break soon. ? ?Subjective:  ? ?1. Essential hypertension ?Gabriella Riley's blood pressure is elevated today. She is not on medications and she is working on diet, exercise, and weight loss, but she is at high risk of CAD. ? ?2. Vitamin D deficiency ?Gabriella Riley is stable on Vit D, but her level is not yet at goal. ? ?3. Pre-diabetes ?Gabriella Riley increased Mounjaro at her last visit. She notes decreased polyphagia with mild GI upset. ? ?Assessment/Plan:  ? ?1. Essential hypertension ?Maidie agreed to start chlorthalidone, and will continue working on healthy weight loss and exercise to improve blood pressure control. We will watch for signs of hypotension as she continues her lifestyle modifications. ? ?- chlorthalidone (HYGROTON) 25 MG tablet; Take 0.5 tablets (12.5 mg total) by mouth daily.  Dispense: 15 tablet; Refill: 0 ? ?2. Vitamin D deficiency ?We will refill prescription Vitamin D 50,000 IU every week for 1 month. Gabriella Riley will follow-up for routine testing of Vitamin D, at least 2-3 times per year to avoid over-replacement. ? ?- Vitamin D, Ergocalciferol, (DRISDOL) 1.25 MG (50000 UNIT) CAPS capsule; Take 1 capsule  (50,000 Units total) by mouth every 7 (seven) days.  Dispense: 4 capsule; Refill: 0 ? ?3. Pre-diabetes ?We will refill Mounjaro for 1 month. Gabriella Riley will continue to work on diet, exercise, and decreasing simple carbohydrates to help decrease the risk of diabetes.  ? ?- tirzepatide (MOUNJARO) 7.5 MG/0.5ML Pen; Inject 7.5 mg into the skin once a week.  Dispense: 6 mL; Refill: 0 ? ?4. Obesity with current BMI of 40.5 ?Gabriella Riley is currently in the action stage of change. As such, her goal is to continue with weight loss efforts. She has agreed to the Category 3 Plan.  ? ?Exercise goals: As is. ? ?Behavioral modification strategies: increasing lean protein intake and dealing with family or coworker sabotage. ? ?Gabriella Riley has agreed to follow-up with our clinic in 3 weeks. She was informed of the importance of frequent follow-up visits to maximize her success with intensive lifestyle modifications for her multiple health conditions.  ? ?Objective:  ? ?Blood pressure (!) 140/94, pulse 75, temperature 97.9 ?F (36.6 ?C), height '5\' 7"'$  (1.702 m), weight 258 lb (117 kg), SpO2 99 %. ?Body mass index is 40.41 kg/m?. ? ?General: Cooperative, alert, well developed, in no acute distress. ?HEENT: Conjunctivae and lids unremarkable. ?Cardiovascular: Regular rhythm.  ?Lungs: Normal work of breathing. ?Neurologic: No focal deficits.  ? ?Lab Results  ?Component Value Date  ? CREATININE 1.04 (H) 12/19/2020  ? BUN 13 12/19/2020  ? NA 138 12/19/2020  ? K 4.3 12/19/2020  ? CL 106 12/19/2020  ? CO2 22 12/19/2020  ? ?Lab Results  ?  Component Value Date  ? ALT 9 12/19/2020  ? AST 14 12/19/2020  ? ALKPHOS 89 09/20/2020  ? BILITOT 0.4 12/19/2020  ? ?Lab Results  ?Component Value Date  ? HGBA1C 5.9 (H) 09/20/2020  ? HGBA1C 5.6 05/25/2018  ? ?Lab Results  ?Component Value Date  ? INSULIN 11.1 09/20/2020  ? ?Lab Results  ?Component Value Date  ? TSH 0.965 09/20/2020  ? ?Lab Results  ?Component Value Date  ? CHOL 186 09/20/2020  ? HDL 47 09/20/2020  ?  LDLCALC 127 (H) 09/20/2020  ? TRIG 66 09/20/2020  ? CHOLHDL 3.1 09/28/2017  ? ?Lab Results  ?Component Value Date  ? VD25OH 16.9 (L) 09/20/2020  ? VD25OH 11.3 (L) 09/28/2017  ? ?Lab Results  ?Component Value Date  ? WBC 9.6 12/19/2020  ? HGB 12.3 12/19/2020  ? HCT 40.7 12/19/2020  ? MCV 70.7 (L) 12/19/2020  ? PLT 329 12/19/2020  ? ?Lab Results  ?Component Value Date  ? IRON 25 (L) 09/20/2020  ? TIBC 335 09/20/2020  ? FERRITIN 34 09/20/2020  ? ?Attestation Statements:  ? ?Reviewed by clinician on day of visit: allergies, medications, problem list, medical history, surgical history, family history, social history, and previous encounter notes. ? ? ?I, Trixie Dredge, am acting as transcriptionist for Dennard Nip, MD. ? ?I have reviewed the above documentation for accuracy and completeness, and I agree with the above. -  Dennard Nip, MD ? ? ?

## 2021-05-30 LAB — CERVICOVAGINAL ANCILLARY ONLY
Bacterial Vaginitis (gardnerella): NEGATIVE
Candida Glabrata: NEGATIVE
Candida Vaginitis: POSITIVE — AB
Chlamydia: NEGATIVE
Comment: NEGATIVE
Comment: NEGATIVE
Comment: NEGATIVE
Comment: NEGATIVE
Comment: NEGATIVE
Comment: NORMAL
Neisseria Gonorrhea: NEGATIVE
Trichomonas: NEGATIVE

## 2021-05-31 MED ORDER — FLUCONAZOLE 150 MG PO TABS: 150.0000 mg | ORAL_TABLET | Freq: Once | ORAL | 3 refills | Status: AC

## 2021-05-31 NOTE — Addendum Note (Signed)
Addended by: Verita Schneiders A on: 05/31/2021 11:40 AM ? ? Modules accepted: Orders ? ?

## 2021-06-03 LAB — CYTOLOGY - PAP
Comment: NEGATIVE
Diagnosis: NEGATIVE
High risk HPV: NEGATIVE

## 2021-06-18 ENCOUNTER — Ambulatory Visit (INDEPENDENT_AMBULATORY_CARE_PROVIDER_SITE_OTHER): Payer: BC Managed Care – PPO | Admitting: Family Medicine

## 2021-06-18 ENCOUNTER — Encounter (INDEPENDENT_AMBULATORY_CARE_PROVIDER_SITE_OTHER): Payer: Self-pay | Admitting: Family Medicine

## 2021-06-18 ENCOUNTER — Other Ambulatory Visit: Payer: Self-pay

## 2021-06-18 VITALS — BP 141/90 | HR 68 | Temp 98.6°F | Ht 67.0 in | Wt 254.0 lb

## 2021-06-18 DIAGNOSIS — E559 Vitamin D deficiency, unspecified: Secondary | ICD-10-CM | POA: Diagnosis not present

## 2021-06-18 DIAGNOSIS — I10 Essential (primary) hypertension: Secondary | ICD-10-CM

## 2021-06-18 DIAGNOSIS — E669 Obesity, unspecified: Secondary | ICD-10-CM | POA: Diagnosis not present

## 2021-06-18 DIAGNOSIS — E66813 Obesity, class 3: Secondary | ICD-10-CM

## 2021-06-18 DIAGNOSIS — R7303 Prediabetes: Secondary | ICD-10-CM | POA: Diagnosis not present

## 2021-06-18 DIAGNOSIS — E119 Type 2 diabetes mellitus without complications: Secondary | ICD-10-CM

## 2021-06-18 DIAGNOSIS — Z6839 Body mass index (BMI) 39.0-39.9, adult: Secondary | ICD-10-CM

## 2021-06-18 MED ORDER — TIRZEPATIDE 5 MG/0.5ML ~~LOC~~ SOAJ
5.0000 mg | SUBCUTANEOUS | 0 refills | Status: DC
Start: 2021-06-18 — End: 2021-07-16

## 2021-06-18 MED ORDER — CHLORTHALIDONE 25 MG PO TABS: 12.5000 mg | ORAL_TABLET | Freq: Every day | ORAL | 0 refills | Status: DC

## 2021-06-18 MED ORDER — VITAMIN D (ERGOCALCIFEROL) 1.25 MG (50000 UNIT) PO CAPS: 50000.0000 [IU] | ORAL_CAPSULE | ORAL | 0 refills | Status: DC

## 2021-06-19 ENCOUNTER — Other Ambulatory Visit (INDEPENDENT_AMBULATORY_CARE_PROVIDER_SITE_OTHER): Payer: Self-pay | Admitting: Family Medicine

## 2021-06-19 DIAGNOSIS — I10 Essential (primary) hypertension: Secondary | ICD-10-CM

## 2021-06-24 NOTE — Progress Notes (Signed)
? ? ? ?Chief Complaint:  ? ?OBESITY ?Artrice is here to discuss her progress with her obesity treatment plan along with follow-up of her obesity related diagnoses. Aviyanna is on the Category 3 Plan and states she is following her eating plan approximately 80% of the time. Elea states she is doing 0 minutes 0 times per week. ? ?Today's visit was #: 13 ?Starting weight: 270 lbs ?Starting date: 09/20/2020 ?Today's weight: 254 lbs ?Today's date: 06/18/2021 ?Total lbs lost to date: 90 ?Total lbs lost since last in-office visit: 4 ? ?Interim History: Kalaya has done well with weight loss on her plan. Her hunger is greatly decreasing, and maybe too much. She hasn't been feeling well and this has limited her exercise.  ? ?Subjective:  ? ?1. Pre-diabetes ?Kanon's Mounjaro was increased  to 7.5 mg at her last visit and she has increased nausea, and hasn't been able to eat the food on her plan. ? ?2. Vitamin D deficiency ?Prabhleen is stable on Vitamin D with no side effects noted.  ? ?3. Essential hypertension ?Emberlynn's blood pressure is mildly elevated today. She feels this may be due to nausea.  ? ?Assessment/Plan:  ? ?1. Pre-diabetes ?Rosalinda agreed to decrease Mounjaro to 5 mg (skip 1 dose before starting 5 mg again). Devera will continue to work on weight loss, exercise, and decreasing simple carbohydrates to help decrease the risk of diabetes.  ?.  ?- tirzepatide (MOUNJARO) 5 MG/0.5ML Pen; Inject 5 mg into the skin once a week.  Dispense: 2 mL; Refill: 0 ? ?2. Vitamin D deficiency ?We will refill prescription Vitamin D for 1 month. Kayelee will follow-up for routine testing of Vitamin D, at least 2-3 times per year to avoid over-replacement. ? ?- Vitamin D, Ergocalciferol, (DRISDOL) 1.25 MG (50000 UNIT) CAPS capsule; Take 1 capsule (50,000 Units total) by mouth every 7 (seven) days.  Dispense: 4 capsule; Refill: 0 ? ?3. Essential hypertension ?We will refill chlorthalidone for 1 month, and we will recheck Macon's blood  pressure at her next visit. ? ?- chlorthalidone (HYGROTON) 25 MG tablet; Take 0.5 tablets (12.5 mg total) by mouth daily.  Dispense: 15 tablet; Refill: 0 ? ?4. Obesity with current BMI of 39.9 ?Louetta is currently in the action stage of change. As such, her goal is to continue with weight loss efforts. She has agreed to the Category 3 Plan.  ? ?Exercise goals: All adults should avoid inactivity. Some physical activity is better than none, and adults who participate in any amount of physical activity gain some health benefits. ? ?Behavioral modification strategies: no skipping meals and travel eating strategies. ? ?Tequilla has agreed to follow-up with our clinic in 4 weeks. She was informed of the importance of frequent follow-up visits to maximize her success with intensive lifestyle modifications for her multiple health conditions.  ? ?Objective:  ? ?Blood pressure (!) 141/90, pulse 68, temperature 98.6 ?F (37 ?C), height '5\' 7"'$  (1.702 m), weight 254 lb (115.2 kg), SpO2 99 %. ?Body mass index is 39.78 kg/m?. ? ?General: Cooperative, alert, well developed, in no acute distress. ?HEENT: Conjunctivae and lids unremarkable. ?Cardiovascular: Regular rhythm.  ?Lungs: Normal work of breathing. ?Neurologic: No focal deficits.  ? ?Lab Results  ?Component Value Date  ? CREATININE 1.04 (H) 12/19/2020  ? BUN 13 12/19/2020  ? NA 138 12/19/2020  ? K 4.3 12/19/2020  ? CL 106 12/19/2020  ? CO2 22 12/19/2020  ? ?Lab Results  ?Component Value Date  ? ALT 9 12/19/2020  ?  AST 14 12/19/2020  ? ALKPHOS 89 09/20/2020  ? BILITOT 0.4 12/19/2020  ? ?Lab Results  ?Component Value Date  ? HGBA1C 5.9 (H) 09/20/2020  ? HGBA1C 5.6 05/25/2018  ? ?Lab Results  ?Component Value Date  ? INSULIN 11.1 09/20/2020  ? ?Lab Results  ?Component Value Date  ? TSH 0.965 09/20/2020  ? ?Lab Results  ?Component Value Date  ? CHOL 186 09/20/2020  ? HDL 47 09/20/2020  ? LDLCALC 127 (H) 09/20/2020  ? TRIG 66 09/20/2020  ? CHOLHDL 3.1 09/28/2017  ? ?Lab Results   ?Component Value Date  ? VD25OH 16.9 (L) 09/20/2020  ? VD25OH 11.3 (L) 09/28/2017  ? ?Lab Results  ?Component Value Date  ? WBC 9.6 12/19/2020  ? HGB 12.3 12/19/2020  ? HCT 40.7 12/19/2020  ? MCV 70.7 (L) 12/19/2020  ? PLT 329 12/19/2020  ? ?Lab Results  ?Component Value Date  ? IRON 25 (L) 09/20/2020  ? TIBC 335 09/20/2020  ? FERRITIN 34 09/20/2020  ? ?Attestation Statements:  ? ?Reviewed by clinician on day of visit: allergies, medications, problem list, medical history, surgical history, family history, social history, and previous encounter notes. ? ? ?I, Trixie Dredge, am acting as transcriptionist for Dennard Nip, MD. ? ?I have reviewed the above documentation for accuracy and completeness, and I agree with the above. -  Dennard Nip, MD ? ? ?

## 2021-07-01 ENCOUNTER — Ambulatory Visit (INDEPENDENT_AMBULATORY_CARE_PROVIDER_SITE_OTHER): Payer: BC Managed Care – PPO | Admitting: Pharmacist

## 2021-07-01 DIAGNOSIS — I10 Essential (primary) hypertension: Secondary | ICD-10-CM

## 2021-07-01 NOTE — Patient Instructions (Signed)
Monitor your home blood pressure . Our goal is less than 140/90 ? ?We recommend a blood pressure cuff that goes around your upper arm, as these are generally the most accurate.  ? ?To appropriately check your blood pressure, make sure you do the following:  ?1) Avoid caffeine, exercise, or tobacco products for 30 minutes before checking. ?2) Sit with your back supported in a flat-backed chair. Rest your arm on something flat (arm of the chair, table, etc). ?3) Sit still with your feet flat on the floor, resting, for at least 5 minutes.  ?4) Check your blood pressure. Take 1-2 readings.  ? ?Write down these readings and bring with you to any provider appointments. Bring your home blood pressure machine with you to a provider's office for accuracy comparison at least once a year. ? ? ?Thank you! ? ?Wallace Cullens, PharmD, BCACP ?Clinical Pharmacist ?[facility] ?[place] ?[phone] ? ? ?

## 2021-07-01 NOTE — Progress Notes (Signed)
?  Chronic Care Management  ? ?Outreach Note ? ?07/01/2021 ?Name: Gabriella Riley MRN: [id number] DOB: Aug 16, 1975 ? ?I connected with Michaell Cowing on '@date'$ @ by telephone outreach and verified that I am speaking with the correct person using two identifiers. ? ?Patient appearing on report for [place] Metric Hypertension Control due to last documented ambulatory blood pressure of 141/90 on 06/18/2021. No next appointment with PCP currently scheduled. ? ?Outreached patient to discuss hypertension control and medication management.  ? ?Outpatient Encounter Medications as of 07/01/2021  ?Medication Sig  ? acetaminophen (TYLENOL) 500 MG tablet Take 500-1,000 mg by mouth every 6 (six) hours as needed (for pain.).  ? chlorthalidone (HYGROTON) 25 MG tablet Take 0.5 tablets (12.5 mg total) by mouth daily.  ? clobetasol (TEMOVATE) 0.05 % external solution Apply 1 application topically 2 (two) times daily.  ? Multiple Vitamins-Minerals (ADULT GUMMY PO) Take 2 tablets by mouth daily.  ? tirzepatide (MOUNJARO) 5 MG/0.5ML Pen Inject 5 mg into the skin once a week.  ? Vitamin D, Ergocalciferol, (DRISDOL) 1.25 MG (50000 UNIT) CAPS capsule Take 1 capsule (50,000 Units total) by mouth every 7 (seven) days.  ? ?No facility-administered encounter medications on file as of 07/01/2021.  ? ?Lab Results  ?Component Value Date  ? CREATININE 1.04 (H) 12/19/2020  ? BUN 13 12/19/2020  ? NA 138 12/19/2020  ? K 4.3 12/19/2020  ? CL 106 12/19/2020  ? CO2 22 12/19/2020  ? ?BP Readings from Last 3 Encounters:  ?06/18/21 (!) 141/90  ?05/28/21 (!) 161/104  ?05/27/21 (!) 140/94  ? ?Pulse Readings from Last 3 Encounters:  ?06/18/21 68  ?05/28/21 73  ?05/27/21 75  ? ? ?Current medications: chlorthalidone 25 mg - 1/2 tablet (12.5 mg) daily (Reports started taking ~4 weeks ago as directed by Dr. Leafy Ro) ? ?Patient does not have an automated upper arm home BP machine ?Encourage patient to obtain a home upper arm blood pressure monitor ?Counsel on  taking measurements to confirm obtains correct cuff size ?Counsel on BP monitoring technique ? ?Reports has cut back on adding salt to her food ? ? ?Current physical activity: walks ~45 minutes/day x 2-3 days/week ? ? ?Assessment/Plan: ?- Currently uncontrolled ?- Reviewed goal blood pressure <140/90 ?- Counseled on long term microvascular and macrovascular complications of uncontrolled hypertension ?- Reviewed appropriate home BP monitoring technique (avoid caffeine, smoking, and exercise for 30 minutes before checking, rest for at least 5 minutes before taking BP, sit with feet flat on the floor and back against a hard surface, uncross legs, and rest arm on flat surface) ?- Reviewed to check blood pressure, document, and provide at next provider visit ?- Discussed dietary modifications, such as reduced salt intake, focus on whole grains, vegetables, lean proteins ? ? ?Follow Up Plan: CM Pharmacist will outreach to patient by telephone again on 08/05/2021 at 8:30 AM ? ?Wallace Cullens, PharmD, BCACP ?Clinical Pharmacist ?[facility] ?[phone] ? ?

## 2021-07-02 ENCOUNTER — Other Ambulatory Visit: Payer: Self-pay | Admitting: *Deleted

## 2021-07-02 MED ORDER — FLUCONAZOLE 150 MG PO TABS: 150.0000 mg | ORAL_TABLET | Freq: Once | ORAL | 3 refills | Status: AC

## 2021-07-13 ENCOUNTER — Other Ambulatory Visit (INDEPENDENT_AMBULATORY_CARE_PROVIDER_SITE_OTHER): Payer: Self-pay | Admitting: Family Medicine

## 2021-07-13 DIAGNOSIS — E559 Vitamin D deficiency, unspecified: Secondary | ICD-10-CM

## 2021-07-16 ENCOUNTER — Encounter (INDEPENDENT_AMBULATORY_CARE_PROVIDER_SITE_OTHER): Payer: Self-pay | Admitting: Family Medicine

## 2021-07-16 ENCOUNTER — Ambulatory Visit (INDEPENDENT_AMBULATORY_CARE_PROVIDER_SITE_OTHER): Payer: BC Managed Care – PPO | Admitting: Family Medicine

## 2021-07-16 VITALS — BP 129/89 | HR 68 | Temp 99.2°F | Ht 67.0 in | Wt 253.0 lb

## 2021-07-16 DIAGNOSIS — E559 Vitamin D deficiency, unspecified: Secondary | ICD-10-CM

## 2021-07-16 DIAGNOSIS — Z6839 Body mass index (BMI) 39.0-39.9, adult: Secondary | ICD-10-CM

## 2021-07-16 DIAGNOSIS — R7303 Prediabetes: Secondary | ICD-10-CM

## 2021-07-16 DIAGNOSIS — E669 Obesity, unspecified: Secondary | ICD-10-CM | POA: Diagnosis not present

## 2021-07-17 MED ORDER — VITAMIN D (ERGOCALCIFEROL) 1.25 MG (50000 UNIT) PO CAPS: 50000.0000 [IU] | ORAL_CAPSULE | ORAL | 0 refills | Status: DC

## 2021-07-17 MED ORDER — TIRZEPATIDE 7.5 MG/0.5ML ~~LOC~~ SOAJ: 7.5000 mg | SUBCUTANEOUS | 0 refills | Status: DC

## 2021-07-29 ENCOUNTER — Other Ambulatory Visit (INDEPENDENT_AMBULATORY_CARE_PROVIDER_SITE_OTHER): Payer: Self-pay | Admitting: Family Medicine

## 2021-07-29 DIAGNOSIS — I10 Essential (primary) hypertension: Secondary | ICD-10-CM

## 2021-07-29 NOTE — Progress Notes (Signed)
? ? ? ?Chief Complaint:  ? ?OBESITY ?Gabriella Riley is here to discuss her progress with her obesity treatment plan along with follow-up of her obesity related diagnoses. Gabriella Riley is on the Category 3 Plan and states she is following her eating plan approximately 0% of the time. Gabriella Riley states she is doing 0 minutes 0 times per week. ? ?Today's visit was #: 14 ?Starting weight: 270 lbs ?Starting date: 09/20/2020 ?Today's weight: 253 lbs ?Today's date: 07/16/2021 ?Total lbs lost to date: 21 ?Total lbs lost since last in-office visit: 1 ? ?Interim History: Gabriella Riley continues to do well with weight loss. Her exercise has slowed down but she is working on increasing her walking again. Her hunger has started to increase. ? ?Subjective:  ? ?1. Vitamin D deficiency ?Gabriella Riley is on Vitamin D, but her level is not yet at goal.  ? ?2. Pre-diabetes ?Gabriella Riley notes polyphagia has increased. She has no problems with Mounjaro. ? ?Assessment/Plan:  ? ?1. Vitamin D deficiency ?We will refill prescription Vitamin D for 1 month. Gabriella Riley will follow-up for routine testing of Vitamin D, at least 2-3 times per year to avoid over-replacement. ? ?- Vitamin D, Ergocalciferol, (DRISDOL) 1.25 MG (50000 UNIT) CAPS capsule; Take 1 capsule (50,000 Units total) by mouth every 7 (seven) days.  Dispense: 4 capsule; Refill: 0 ? ?2. Pre-diabetes ?Gabriella Riley agreed to increase Mounjaro to 7.5 mg, and we will refill for 1 month. We will follow up at her next visit. ? ?- tirzepatide (MOUNJARO) 7.5 MG/0.5ML Pen; Inject 7.5 mg into the skin once a week.  Dispense: 2 mL; Refill: 0 ? ?3. Obesity with current BMI of 39.7 ?Gabriella Riley is currently in the action stage of change. As such, her goal is to continue with weight loss efforts. She has agreed to the Category 3 Plan.  ? ?Behavioral modification strategies: increasing lean protein intake and meal planning and cooking strategies. ? ?Gabriella Riley has agreed to follow-up with our clinic in 4 weeks. She was informed of the importance of  frequent follow-up visits to maximize her success with intensive lifestyle modifications for her multiple health conditions.  ? ?Objective:  ? ?Blood pressure 129/89, pulse 68, temperature 99.2 ?F (37.3 ?C), height '5\' 7"'$  (1.702 m), weight 253 lb (114.8 kg), SpO2 100 %. ?Body mass index is 39.63 kg/m?. ? ?General: Cooperative, alert, well developed, in no acute distress. ?HEENT: Conjunctivae and lids unremarkable. ?Cardiovascular: Regular rhythm.  ?Lungs: Normal work of breathing. ?Neurologic: No focal deficits.  ? ?Lab Results  ?Component Value Date  ? CREATININE 1.04 (H) 12/19/2020  ? BUN 13 12/19/2020  ? NA 138 12/19/2020  ? K 4.3 12/19/2020  ? CL 106 12/19/2020  ? CO2 22 12/19/2020  ? ?Lab Results  ?Component Value Date  ? ALT 9 12/19/2020  ? AST 14 12/19/2020  ? ALKPHOS 89 09/20/2020  ? BILITOT 0.4 12/19/2020  ? ?Lab Results  ?Component Value Date  ? HGBA1C 5.9 (H) 09/20/2020  ? HGBA1C 5.6 05/25/2018  ? ?Lab Results  ?Component Value Date  ? INSULIN 11.1 09/20/2020  ? ?Lab Results  ?Component Value Date  ? TSH 0.965 09/20/2020  ? ?Lab Results  ?Component Value Date  ? CHOL 186 09/20/2020  ? HDL 47 09/20/2020  ? LDLCALC 127 (H) 09/20/2020  ? TRIG 66 09/20/2020  ? CHOLHDL 3.1 09/28/2017  ? ?Lab Results  ?Component Value Date  ? VD25OH 16.9 (L) 09/20/2020  ? VD25OH 11.3 (L) 09/28/2017  ? ?Lab Results  ?Component Value Date  ? WBC 9.6  12/19/2020  ? HGB 12.3 12/19/2020  ? HCT 40.7 12/19/2020  ? MCV 70.7 (L) 12/19/2020  ? PLT 329 12/19/2020  ? ?Lab Results  ?Component Value Date  ? IRON 25 (L) 09/20/2020  ? TIBC 335 09/20/2020  ? FERRITIN 34 09/20/2020  ? ?Attestation Statements:  ? ?Reviewed by clinician on day of visit: allergies, medications, problem list, medical history, surgical history, family history, social history, and previous encounter notes. ? ? ?I, Trixie Dredge, am acting as transcriptionist for Dennard Nip, MD. ? ?I have reviewed the above documentation for accuracy and completeness, and I agree with  the above. -  Dennard Nip, MD ? ? ?

## 2021-08-05 ENCOUNTER — Telehealth: Payer: BC Managed Care – PPO

## 2021-08-10 ENCOUNTER — Other Ambulatory Visit: Payer: Self-pay | Admitting: Internal Medicine

## 2021-08-12 NOTE — Telephone Encounter (Signed)
Requested medications are due for refill today.  yes  Requested medications are on the active medications list.  yes  Last refill. 03/21/2021  74m  Future visit scheduled.   no  Notes to clinic.  Medication refill is not delegated.    Requested Prescriptions  Pending Prescriptions Disp Refills   clobetasol (TEMOVATE) 0.05 % external solution [Pharmacy Med Name: CLOBETASOL 0.05% SOLUTION] 50 mL 0    Sig: APPLY TO AFFECTED AREA TWICE A DAY     Not Delegated - Dermatology:  Corticosteroids Failed - 08/10/2021 11:00 AM      Failed - This refill cannot be delegated      Passed - Valid encounter within last 12 months    Recent Outpatient Visits           [facility] month ago [facility]ential [place]   [facility]   5 months ago Allergic contact dermatitis due to cosmetics   [facility]   7 months [facility]   1 year ago [facility] left axilla   [facility] [place]

## 2021-08-15 ENCOUNTER — Ambulatory Visit (INDEPENDENT_AMBULATORY_CARE_PROVIDER_SITE_OTHER): Payer: BC Managed Care – PPO | Admitting: Family Medicine

## 2021-09-03 ENCOUNTER — Encounter (INDEPENDENT_AMBULATORY_CARE_PROVIDER_SITE_OTHER): Payer: Self-pay | Admitting: Family Medicine

## 2021-09-03 ENCOUNTER — Ambulatory Visit (INDEPENDENT_AMBULATORY_CARE_PROVIDER_SITE_OTHER): Payer: BC Managed Care – PPO | Admitting: Family Medicine

## 2021-09-03 VITALS — BP 142/95 | HR 63 | Temp 98.5°F | Ht 67.0 in | Wt 250.0 lb

## 2021-09-03 DIAGNOSIS — Z6839 Body mass index (BMI) 39.0-39.9, adult: Secondary | ICD-10-CM

## 2021-09-03 DIAGNOSIS — I10 Essential (primary) hypertension: Secondary | ICD-10-CM | POA: Diagnosis not present

## 2021-09-03 DIAGNOSIS — E559 Vitamin D deficiency, unspecified: Secondary | ICD-10-CM | POA: Diagnosis not present

## 2021-09-03 DIAGNOSIS — E669 Obesity, unspecified: Secondary | ICD-10-CM | POA: Diagnosis not present

## 2021-09-03 DIAGNOSIS — R7303 Prediabetes: Secondary | ICD-10-CM

## 2021-09-03 DIAGNOSIS — Z7985 Long-term (current) use of injectable non-insulin antidiabetic drugs: Secondary | ICD-10-CM

## 2021-09-03 MED ORDER — TIRZEPATIDE 7.5 MG/0.5ML ~~LOC~~ SOAJ: 7.5000 mg | SUBCUTANEOUS | 0 refills | Status: DC

## 2021-09-03 MED ORDER — VITAMIN D (ERGOCALCIFEROL) 1.25 MG (50000 UNIT) PO CAPS: 50000.0000 [IU] | ORAL_CAPSULE | ORAL | 0 refills | Status: DC

## 2021-09-03 MED ORDER — CHLORTHALIDONE 25 MG PO TABS: 25.0000 mg | ORAL_TABLET | Freq: Every day | ORAL | 0 refills | Status: DC

## 2021-09-04 ENCOUNTER — Ambulatory Visit: Payer: Self-pay

## 2021-09-04 ENCOUNTER — Ambulatory Visit
Admission: EM | Admit: 2021-09-04 | Discharge: 2021-09-04 | Disposition: A | Discharge status: Home / Self Care | Payer: BC Managed Care – PPO | Attending: Family Medicine | Admitting: Family Medicine

## 2021-09-04 DIAGNOSIS — L299 Pruritus, unspecified: Secondary | ICD-10-CM

## 2021-09-04 MED ORDER — PREDNISONE 20 MG PO TABS: 40.0000 mg | ORAL_TABLET | Freq: Every day | ORAL | 0 refills | Status: AC

## 2021-09-04 MED ORDER — IVERMECTIN 3 MG PO TABS: ORAL_TABLET | ORAL | 0 refills | Status: DC

## 2021-09-04 MED ORDER — PERMETHRIN 5 % EX CREA: TOPICAL_CREAM | CUTANEOUS | 0 refills | Status: DC

## 2021-09-04 NOTE — Progress Notes (Signed)
Chief Complaint:   OBESITY Early is here to discuss her progress with her obesity treatment plan along with follow-up of her obesity related diagnoses. Vista is on the Category 3 Plan and states she is following her eating plan approximately 20% of the time. Floreen states she is walking for 20 minutes 3 times per week.  Today's visit was #: 15 Starting weight: 270 lbs Starting date: 09/20/2020 Today's weight: 250 lbs Today's date: 09/03/2021 Total lbs lost to date: 20 Total lbs lost since last in-office visit: 3  Interim History: Latrece continues to do well with weight loss despite some increase in celebration eating. She tried to portion control and she did well overall. She will be traveling soon, and she is open to discussing travel rating strategies.   Subjective:   1. Essential hypertension Celicia's blood pressure is still elevated today.  She is on 12.5 mg of chlorothiazide with no side effects noted.  2. Pre-diabetes Annebelle is doing well on Mounjaro and her hunger is better controlled.  She is doing well with portion control.  3. Vitamin D deficiency Dontavia is stable on vitamin D, but her level is not yet at goal.  Assessment/Plan:   1. Essential hypertension Diannia agree to increase chlorothiazide to 25 mg daily with no refills.  We will recheck her blood pressure in 3 weeks at her next office visit.  We will recheck labs in 1 month.  - chlorthalidone (HYGROTON) 25 MG tablet; Take 1 tablet (25 mg total) by mouth daily.  Dispense: 30 tablet; Refill: 0  2. Pre-diabetes We will refill Mounjaro 7.5 mg weekly for 1 month.  We will recheck labs in 1 month.  - tirzepatide (MOUNJARO) 7.5 MG/0.5ML Pen; Inject 7.5 mg into the skin once a week.  Dispense: 2 mL; Refill: 0  3. Vitamin D deficiency We will refill prescription vitamin D 50,000 units weekly for 1 month.  We will recheck labs in 1 month.  - Vitamin D, Ergocalciferol, (DRISDOL) 1.25 MG (50000 UNIT) CAPS  capsule; Take 1 capsule (50,000 Units total) by mouth every 7 (seven) days.  Dispense: 4 capsule; Refill: 0  4. Obesity, Current BMI 39.3 Kasmira is currently in the action stage of change. As such, her goal is to continue with weight loss efforts. She has agreed to the Category 3 Plan.   We will recheck fasting labs at her next visit.  Exercise goals: As is.  Behavioral modification strategies: dealing with family or coworker sabotage, travel eating strategies, and celebration eating strategies.  Chanteria has agreed to follow-up with our clinic in 3 to 4 weeks. She was informed of the importance of frequent follow-up visits to maximize her success with intensive lifestyle modifications for her multiple health conditions.   Objective:   Blood pressure (!) 142/95, pulse 63, temperature 98.5 F (36.9 C), height '5\' 7"'$  (1.702 m), weight 250 lb (113.4 kg), SpO2 100 %. Body mass index is 39.16 kg/m.  General: Cooperative, alert, well developed, in no acute distress. HEENT: Conjunctivae and lids unremarkable. Cardiovascular: Regular rhythm.  Lungs: Normal work of breathing. Neurologic: No focal deficits.   Lab Results  Component Value Date   CREATININE 1.04 (H) 12/19/2020   BUN 13 12/19/2020   NA 138 12/19/2020   K 4.3 12/19/2020   CL 106 12/19/2020   CO2 22 12/19/2020   Lab Results  Component Value Date   ALT 9 12/19/2020   AST 14 12/19/2020   ALKPHOS 89 09/20/2020   BILITOT  0.4 12/19/2020   Lab Results  Component Value Date   HGBA1C 5.9 (H) 09/20/2020   HGBA1C 5.6 05/25/2018   Lab Results  Component Value Date   INSULIN 11.1 09/20/2020   Lab Results  Component Value Date   TSH 0.965 09/20/2020   Lab Results  Component Value Date   CHOL 186 09/20/2020   HDL 47 09/20/2020   LDLCALC 127 (H) 09/20/2020   TRIG 66 09/20/2020   CHOLHDL 3.1 09/28/2017   Lab Results  Component Value Date   VD25OH 16.9 (L) 09/20/2020   VD25OH 11.3 (L) 09/28/2017   Lab Results   Component Value Date   WBC 9.6 12/19/2020   HGB 12.3 12/19/2020   HCT 40.7 12/19/2020   MCV 70.7 (L) 12/19/2020   PLT 329 12/19/2020   Lab Results  Component Value Date   IRON 25 (L) 09/20/2020   TIBC 335 09/20/2020   FERRITIN 34 09/20/2020   Attestation Statements:   Reviewed by clinician on day of visit: allergies, medications, problem list, medical history, surgical history, family history, social history, and previous encounter notes.  Time spent on visit including pre-visit chart review and post-visit care and charting was 40 minutes.   I, Trixie Dredge, am acting as transcriptionist for Dennard Nip, MD.  I have reviewed the above documentation for accuracy and completeness, and I agree with the above. -  Dennard Nip, MD

## 2021-09-04 NOTE — ED Provider Notes (Signed)
[facility]    CSN: [id number] Arrival date & time: 09/04/21  1618      History   Chief Complaint Chief Complaint  Patient presents with   Rash    HPI Gabriella Riley is a 46 y.o. female.    Rash  Here for some itching and feeling like things are crawling on her in the last 2 to 3 days.  She has scattered pink raised bumps on her arms and trunk.  She mainly feels the crawling sensation at night.  She did find a little bug on her phone that was visible.  She is concerned about scabies.  No fever or upper respiratory symptoms or trouble breathing  Past Medical History:  Diagnosis Date   Anemia    Arthritis    Back pain    Bilateral swelling of feet and ankles    Constipation    Food allergy    GERD (gastroesophageal reflux disease)    History of blood clots    Hyperlipidemia    Hypertension    Joint pain    Lactose intolerance    Other fatigue    Shortness of breath    Shortness of breath on exertion    Vitamin D deficiency     Patient Active Problem List   Diagnosis Date Noted   Mirena IUD (intrauterine device) in place since 01/20/2020 05/28/2021   Prediabetes 12/19/2020   HLD (hyperlipidemia) 12/19/2020   Morbid obesity ([place]) 12/19/2020   Essential hypertension 10/04/2019   Menorrhagia with regular cycle 02/04/2018   Arthritis of both knees 01/02/2015    Past Surgical History:  Procedure Laterality Date   BREAST BIOPSY Left 2007   BREAST EXCISIONAL BIOPSY     TUBAL LIGATION      OB History     Gravida  4   Para  4   Term  4   Preterm  0   AB  0   Living  4      SAB  0   IAB  0   Ectopic  0   Multiple  0   Live Births  4            Home Medications    Prior to Admission medications   Medication Sig Start Date End Date Taking? Authorizing Provider  ivermectin (STROMECTOL) 3 MG TABS tablet Take 7 tablets today, then take a second dose in 1 week 09/04/21  Yes Reily Treloar, Gwenlyn Perking, MD  permethrin (ELIMITE) 5  % cream Apply to affected area once, and repeat in 1-2 weeks 09/04/21  Yes Liahna Brickner, Gwenlyn Perking, MD  predniSONE (DELTASONE) 20 MG tablet Take 2 tablets (40 mg total) by mouth daily with breakfast for 5 days. 09/04/21 09/09/21 Yes Zuleica Seith, Gwenlyn Perking, MD  acetaminophen (TYLENOL) 500 MG tablet Take 500-1,000 mg by mouth every 6 (six) hours as needed (for pain.).    [provider]  chlorthalidone (HYGROTON) 25 MG tablet Take 1 tablet (25 mg total) by mouth daily. 09/03/21   Dennard Nip D, MD  clobetasol (TEMOVATE) 0.05 % external solution Apply 1 application topically 2 (two) times daily. 03/21/21   Jearld Fenton, NP  Multiple Vitamins-Minerals (ADULT GUMMY PO) Take 2 tablets by mouth daily.    [provider]  tirzepatide Darcel Bayley) 7.5 MG/0.5ML Pen Inject 7.5 mg into the skin once a week. 09/03/21   Dennard Nip D, MD  Vitamin D, Ergocalciferol, (DRISDOL) 1.25 MG (50000 UNIT) CAPS capsule Take 1 capsule (50,000  Units total) by mouth every 7 (seven) days. 09/03/21   Starlyn Skeans, MD    Family History Family History  Problem Relation Age of Onset   Obesity Father    Sleep apnea Father    Heart disease Father    Diabetes Father    Hyperlipidemia Father    Hypertension Father    Heart failure Father    Arthritis Maternal Grandmother    Heart disease Other    Sleep apnea Other    Obesity Other     Social History Social History   Tobacco Use   Smoking status: Never   Smokeless tobacco: Never  Vaping Use   Vaping Use: Never used  Substance Use Topics   Alcohol use: Yes    Alcohol/week: 0.0 standard drinks of alcohol    Comment: social   Drug use: No     Allergies   Shellfish allergy and Latex   Review of Systems Review of Systems  Skin:  Positive for rash.     Physical Exam Triage Vital Signs ED Triage Vitals  Enc Vitals Group     BP 09/04/21 1640 (!) 149/97     Pulse Rate 09/04/21 1640 72     Resp 09/04/21 1640 17     Temp 09/04/21 1640 98.3 F  (36.8 C)     Temp Source 09/04/21 1640 Oral     SpO2 09/04/21 1640 98 %     Weight --      Height --      Head Circumference --      Peak Flow --      Pain Score 09/04/21 1639 0     Pain Loc --      Pain Edu? --      Excl. in [place]? --    No data found.  Updated Vital Signs BP (!) 149/97 (BP Location: Left Arm)   Pulse 72   Temp 98.3 F (36.8 C) (Oral)   Resp 17   SpO2 98%   Visual Acuity Right Eye Distance:   Left Eye Distance:   Bilateral Distance:    Right Eye Near:   Left Eye Near:    Bilateral Near:     Physical Exam Vitals reviewed.  Constitutional:      General: She is not in acute distress.    Appearance: She is not ill-appearing, toxic-appearing or diaphoretic.  Cardiovascular:     Rate and Rhythm: Normal rate and regular rhythm.     Heart sounds: No murmur heard. Pulmonary:     Effort: Pulmonary effort is normal. No respiratory distress.     Breath sounds: No stridor. No wheezing, rhonchi or rales.  Skin:    Coloration: Skin is not jaundiced or pale.     Comments: There are some raised pink bumps on her skin mostly around 3 to 4 mm in width by a centimeter in length.  They are on her arms and trunk and legs.  Neurological:     General: No focal deficit present.     Mental Status: She is alert and oriented to person, place, and time.  Psychiatric:        Behavior: Behavior normal.      UC Treatments / Results  Labs (all labs ordered are listed, but only abnormal results are displayed) Labs Reviewed - No data to display  EKG   Radiology No results found.  Procedures Procedures (including critical care time)  Medications Ordered in UC Medications - No data to  display  Initial Impression / Assessment and Plan / UC Course  I have reviewed the triage vital signs and the nursing notes.  Pertinent labs & imaging results that were available during my care of the patient were reviewed by me and considered in my medical decision making (see chart  for details).     I will treat for possible scabies, though I doubt.  I am printing a prescription for prednisone so that if she has not improved in 2 to 3 days she can fill that and take it.  She is going to spray insecticide in that room also Final Clinical Impressions(s) / UC Diagnoses   Final diagnoses:  Itching     Discharge Instructions      Use the permethrin today and then you can repeat it in 1 to 2 weeks  The ivermectin is taken 7 tablets today and then you repeat the dose in 1 week  If you are not any better in 2 to 3 days, then fill the prednisone prescription and take it as directed on the bottle     ED Prescriptions     Medication Sig Dispense Auth. Provider   ivermectin (STROMECTOL) 3 MG TABS tablet Take 7 tablets today, then take a second dose in 1 week 14 tablet Anagabriela Jokerst, Gwenlyn Perking, MD   permethrin (ELIMITE) 5 % cream Apply to affected area once, and repeat in 1-2 weeks 60 g Tripton Ned, Gwenlyn Perking, MD   predniSONE (DELTASONE) 20 MG tablet Take 2 tablets (40 mg total) by mouth daily with breakfast for 5 days. 10 tablet Windy Carina Gwenlyn Perking, MD      PDMP not reviewed this encounter.   Barrett Henle, MD 09/04/21 [phone]

## 2021-09-04 NOTE — Telephone Encounter (Signed)
   Chief Complaint: Rash arms, stomach, chest Symptoms: Itchy Frequency: Sunday Pertinent Negatives: Patient denies  Disposition: '[]'$ ED /'[x]'$ Urgent Care (no appt availability in office) / '[]'$ Appointment(In office/virtual)/ '[]'$  [facility]/ '[]'$ Home Care/ '[]'$ Refused Recommended Disposition /'[]'$ [facility]/ '[]'$  Follow-up with PCP Additional Notes:    Reason for Disposition  SEVERE itching (i.e., interferes with sleep, normal activities or school)  Answer Assessment - Initial Assessment Questions 1. APPEARANCE of RASH: "Describe the rash." (e.g., spots, blisters, raised areas Hives, pimples, skin peeling, scaly)     Hives, pimples 2. SIZE: "How big are the spots?" (e.g., tip of pen, eraser, coin; inches, centimeters)     Different sizes 3. LOCATION: "Where is the rash located?"     Arms, stomach, chest 4. COLOR: "What color is the rash?" (Note: It is difficult to assess rash color in people with darker-colored skin. When this situation occurs, simply ask the caller to describe what they see.)     No 5. ONSET: "When did the rash begin?"     Sunday 6. FEVER: "Do you have a fever?" If Yes, ask: "What is your temperature, how was it measured, and when did it start?"     No 7. ITCHING: "Does the rash itch?" If Yes, ask: "How bad is the itch?" (Scale 1-10; or mild, moderate, severe)     Severe 8. CAUSE: "What do you think is causing the rash?"     Unsure 9. MEDICINE FACTORS: "Have you started any new medicines within the last 2 weeks?" (e.g., antibiotics)      No 10. OTHER SYMPTOMS: "Do you have any other symptoms?" (e.g., dizziness, headache, sore throat, joint pain)       No 11. PREGNANCY: "Is there any chance you are pregnant?" "When was your last menstrual period?"       No  Protocols used: Rash or Redness - [facility]

## 2021-09-04 NOTE — ED Triage Notes (Signed)
Pt presents with rash & itchiness on different areas of her body since Monday.

## 2021-09-04 NOTE — Discharge Instructions (Addendum)
Use the permethrin today and then you can repeat it in 1 to 2 weeks  The ivermectin is taken 7 tablets today and then you repeat the dose in 1 week  If you are not any better in 2 to 3 days, then fill the prednisone prescription and take it as directed on the bottle

## 2021-09-26 ENCOUNTER — Other Ambulatory Visit (INDEPENDENT_AMBULATORY_CARE_PROVIDER_SITE_OTHER): Payer: Self-pay | Admitting: Family Medicine

## 2021-09-26 DIAGNOSIS — I10 Essential (primary) hypertension: Secondary | ICD-10-CM

## 2021-10-02 ENCOUNTER — Ambulatory Visit (INDEPENDENT_AMBULATORY_CARE_PROVIDER_SITE_OTHER): Payer: BC Managed Care – PPO | Admitting: Family Medicine

## 2021-10-14 ENCOUNTER — Ambulatory Visit (INDEPENDENT_AMBULATORY_CARE_PROVIDER_SITE_OTHER): Payer: BC Managed Care – PPO | Admitting: Family Medicine

## 2021-10-17 ENCOUNTER — Other Ambulatory Visit (INDEPENDENT_AMBULATORY_CARE_PROVIDER_SITE_OTHER): Payer: Self-pay | Admitting: Family Medicine

## 2021-10-17 DIAGNOSIS — I10 Essential (primary) hypertension: Secondary | ICD-10-CM

## 2021-10-30 ENCOUNTER — Encounter (INDEPENDENT_AMBULATORY_CARE_PROVIDER_SITE_OTHER): Payer: Self-pay

## 2021-12-24 ENCOUNTER — Ambulatory Visit: Payer: Self-pay

## 2021-12-24 NOTE — Telephone Encounter (Signed)
Summary: Left breast pain, swelling in lips   Pt reports that she has swelling in her lips, tip of her tongue and eyes are a little itchy, throat is not closed up. Left breast pain.   Best contact: [phone]      Chief Complaint: swelling lips, tip of tongue,  Symptoms: see above- no other sx Frequency: Saturday Pertinent Negatives: Patient denies rash, tongue swelling, difficulty breathing, fever, toothache, leg swelling Disposition: '[]'$ ED /'[]'$ Urgent Care (no appt availability in office) / '[x]'$ Appointment(In office/virtual)/ '[]'$  [facility]/ '[]'$ Home Care/ '[]'$ Refused Recommended Disposition /'[]'$ [facility]/ '[]'$  Follow-up with PCP Additional Notes: n/a  Reason for Disposition  [1] Mild face swelling (puffiness) AND [2] persists > 3 days  Answer Assessment - Initial Assessment Questions 1. ONSET: "When did the swelling start?" (e.g., minutes, hours, days)     Saturday left Sunday Monday on the right 2. LOCATION: "What part of the face is swollen?"     Lips, tip of tongue 3. SEVERITY: "How swollen is it?"     Noticeable  4. ITCHING: "Is there any itching?" If Yes, ask: "How much?"   (Scale 1-10; mild, moderate or severe)     no 5. PAIN: "Is the swelling painful to touch?" If Yes, ask: "How painful is it?"   (Scale 1-10; mild, moderate or severe)   - NONE (0): no pain   - MILD (1-3): doesn't interfere with normal activities    - MODERATE (4-7): interferes with normal activities or awakens from sleep    - SEVERE (8-10): excruciating pain, unable to do any normal activities      no 6. FEVER: "Do you have a fever?" If Yes, ask: "What is it, how was it measured, and when did it start?"      no 7. CAUSE: "What do you think is causing the face swelling?"     unsure 8. RECURRENT SYMPTOM: "Have you had face swelling before?" If Yes, ask: "When was the last time?" "What happened that time?"     no 9. OTHER SYMPTOMS: "Do you have any other symptoms?" (e.g., toothache, leg  swelling)     no 10. PREGNANCY: "Is there any chance you are pregnant?" "When was your last menstrual period?"       N/a  Protocols used: Face Swelling-A-AH

## 2021-12-26 ENCOUNTER — Ambulatory Visit: Payer: BC Managed Care – PPO | Admitting: Internal Medicine

## 2021-12-26 NOTE — Progress Notes (Deleted)
Subjective:    Patient ID: Gabriella Riley, female    DOB: 09-01-1975, 46 y.o.   MRN: [id number]  HPI  Patient presents to clinic today for follow-up of chronic conditions.  HTN: Her BP today is.  She is taking Chlorthalidone as prescribed.  ECG from 08/2020 reviewed.  OA: In her knees.  She is taking Tylenol OTC as needed with good relief of symptoms.  She does not follow with orthopedics.  Prediabetes: Her last A1c was 5.9%, 08/2020.  She is not taking any oral diabetic medication at this time.  She does not check her sugars.  HLD: Her last LDL was 127, triglycerides 66, 08/2020.  She is not taking any cholesterol-lowering medication at this time.  She tries to consume a low-fat diet.  She also reports swellings of her lips and tongue.  She noticed this.  Review of Systems     Past Medical History:  Diagnosis Date   Anemia    Arthritis    Back pain    Bilateral swelling of feet and ankles    Constipation    Food allergy    GERD (gastroesophageal reflux disease)    History of blood clots    Hyperlipidemia    Hypertension    Joint pain    Lactose intolerance    Other fatigue    Shortness of breath    Shortness of breath on exertion    Vitamin D deficiency     Current Outpatient Medications  Medication Sig Dispense Refill   acetaminophen (TYLENOL) 500 MG tablet Take 500-1,000 mg by mouth every 6 (six) hours as needed (for pain.).     chlorthalidone (HYGROTON) 25 MG tablet Take 1 tablet (25 mg total) by mouth daily. 30 tablet 0   clobetasol (TEMOVATE) 0.05 % external solution Apply 1 application topically 2 (two) times daily. 50 mL 0   ivermectin (STROMECTOL) 3 MG TABS tablet Take 7 tablets today, then take a second dose in 1 week 14 tablet 0   Multiple Vitamins-Minerals (ADULT GUMMY PO) Take 2 tablets by mouth daily.     permethrin (ELIMITE) 5 % cream Apply to affected area once, and repeat in 1-2 weeks 60 g 0   tirzepatide (MOUNJARO) 7.5 MG/0.5ML Pen Inject 7.5 mg  into the skin once a week. 2 mL 0   Vitamin D, Ergocalciferol, (DRISDOL) 1.25 MG (50000 UNIT) CAPS capsule Take 1 capsule (50,000 Units total) by mouth every 7 (seven) days. 4 capsule 0   No current facility-administered medications for this visit.    Allergies  Allergen Reactions   Shellfish Allergy    Latex Rash    Family History  Problem Relation Age of Onset   Obesity Father    Sleep apnea Father    Heart disease Father    Diabetes Father    Hyperlipidemia Father    Hypertension Father    Heart failure Father    Arthritis Maternal Grandmother    Heart disease Other    Sleep apnea Other    Obesity Other     Social History   Socioeconomic History   Marital status: Married    Spouse name: Will   Number of children: 4   Years of education: Not on file   Highest education level: Not on file  Occupational History   Occupation: Multimedia programmer    Employer: [employer]  Tobacco Use   Smoking status: Never   Smokeless tobacco: Never  Vaping Use  Vaping Use: Never used  Substance and Sexual Activity   Alcohol use: Yes    Alcohol/week: 0.0 standard drinks of alcohol    Comment: social   Drug use: No   Sexual activity: Yes    Birth control/protection: None, Surgical    Comment: tubal ligation   Other Topics Concern   Not on file  Social History Narrative   Not on file   Social Determinants of Health   Financial Resource Strain: Not on file  Food Insecurity: Not on file  Transportation Needs: Not on file  Physical Activity: Not on file  Stress: Not on file  Social Connections: Not on file  Intimate Partner Violence: Not on file     Constitutional: Denies fever, malaise, fatigue, headache or abrupt weight changes.  HEENT: Patient reports swelling of her lips and tongue.  Denies eye pain, eye redness, ear pain, ringing in the ears, wax buildup, runny nose, nasal congestion, bloody nose, or sore throat. Respiratory: Denies difficulty  breathing, shortness of breath, cough or sputum production.   Cardiovascular: Denies chest pain, chest tightness, palpitations or swelling in the hands or feet.  Gastrointestinal: Denies abdominal pain, bloating, constipation, diarrhea or blood in the stool.  GU: Denies urgency, frequency, pain with urination, burning sensation, blood in urine, odor or discharge. Musculoskeletal: Patient reports knee pain.  Denies decrease in range of motion, difficulty with gait, muscle pain or joint swelling.  Skin: Denies redness, rashes, lesions or ulcercations.  Neurological: Denies dizziness, difficulty with memory, difficulty with speech or problems with balance and coordination.  Psych: Denies anxiety, depression, SI/HI.  No other specific complaints in a complete review of systems (except as listed in HPI above).  Objective:   Physical Exam   There were no vitals taken for this visit. Wt Readings from Last 3 Encounters:  09/03/21 250 lb (113.4 kg)  07/16/21 253 lb (114.8 kg)  06/18/21 254 lb (115.2 kg)    General: Appears their stated age, well developed, well nourished in NAD. Skin: Warm, dry and intact. No rashes, lesions or ulcerations noted. HEENT: Head: normal shape and size; Eyes: sclera white, no icterus, conjunctiva pink, PERRLA and EOMs intact; Ears: Tm's gray and intact, normal light reflex; Nose: mucosa pink and moist, septum midline; Throat/Mouth: Teeth present, mucosa pink and moist, no exudate, lesions or ulcerations noted.  Neck:  Neck supple, trachea midline. No masses, lumps or thyromegaly present.  Cardiovascular: Normal rate and rhythm. S1,S2 noted.  No murmur, rubs or gallops noted. No JVD or BLE edema. No carotid bruits noted. Pulmonary/Chest: Normal effort and positive vesicular breath sounds. No respiratory distress. No wheezes, rales or ronchi noted.  Abdomen: Soft and nontender. Normal bowel sounds. No distention or masses noted. Liver, spleen and kidneys non  palpable. Musculoskeletal: Normal range of motion. No signs of joint swelling. No difficulty with gait.  Neurological: Alert and oriented. Cranial nerves II-XII grossly intact. Coordination normal.  Psychiatric: Mood and affect normal. Behavior is normal. Judgment and thought content normal.    BMET    Component Value Date/Time   NA 138 12/19/2020 1053   NA 140 09/20/2020 0844   K 4.3 12/19/2020 1053   CL 106 12/19/2020 1053   CO2 22 12/19/2020 1053   GLUCOSE 72 12/19/2020 1053   BUN 13 12/19/2020 1053   BUN 11 09/20/2020 0844   CREATININE 1.04 (H) 12/19/2020 1053   CALCIUM 9.3 12/19/2020 1053   GFRNONAA 80 09/28/2017 1005   GFRAA 92 09/28/2017 1005  Lipid Panel     Component Value Date/Time   CHOL 186 09/20/2020 0844   TRIG 66 09/20/2020 0844   HDL 47 09/20/2020 0844   CHOLHDL 3.1 09/28/2017 1005   LDLCALC 127 (H) 09/20/2020 0844    CBC    Component Value Date/Time   WBC 9.6 12/19/2020 1053   RBC 5.76 (H) 12/19/2020 1053   HGB 12.3 12/19/2020 1053   HGB 11.7 09/20/2020 0844   HCT 40.7 12/19/2020 1053   HCT 39.8 09/20/2020 0844   PLT 329 12/19/2020 1053   PLT 335 09/20/2020 0844   MCV 70.7 (L) 12/19/2020 1053   MCV 72 (L) 09/20/2020 0844   MCH 21.4 (L) 12/19/2020 1053   MCHC 30.2 (L) 12/19/2020 1053   RDW 16.4 (H) 12/19/2020 1053   RDW 18.1 (H) 09/20/2020 0844   LYMPHSABS 2.0 09/20/2020 0844   EOSABS 0.3 09/20/2020 0844   BASOSABS 0.0 09/20/2020 0844    Hgb A1C Lab Results  Component Value Date   HGBA1C 5.9 (H) 09/20/2020           Assessment & Plan:     RTC in 6 months for your annual exam Webb Silversmith, NP

## 2021-12-30 ENCOUNTER — Encounter: Payer: Self-pay | Admitting: Internal Medicine

## 2021-12-30 ENCOUNTER — Ambulatory Visit: Payer: BC Managed Care – PPO | Admitting: Internal Medicine

## 2021-12-30 VITALS — BP 134/88 | HR 80 | Temp 96.8°F | Wt 269.0 lb

## 2021-12-30 DIAGNOSIS — N644 Mastodynia: Secondary | ICD-10-CM

## 2021-12-30 DIAGNOSIS — L299 Pruritus, unspecified: Secondary | ICD-10-CM

## 2021-12-30 DIAGNOSIS — Z1211 Encounter for screening for malignant neoplasm of colon: Secondary | ICD-10-CM | POA: Diagnosis not present

## 2021-12-30 DIAGNOSIS — T783XXA Angioneurotic edema, initial encounter: Secondary | ICD-10-CM

## 2021-12-30 MED ORDER — METHYLPREDNISOLONE ACETATE 80 MG/ML IJ SUSP
80.0000 mg | Freq: Once | INTRAMUSCULAR | Status: AC
  Administered 2021-12-30: 80 mg via INTRAMUSCULAR

## 2021-12-30 MED ORDER — ALBUTEROL SULFATE HFA 108 (90 BASE) MCG/ACT IN AERS: 2.0000 | INHALATION_SPRAY | Freq: Four times a day (QID) | RESPIRATORY_TRACT | 0 refills | Status: AC | PRN

## 2021-12-30 NOTE — Addendum Note (Signed)
Addended by: Ashley Royalty E on: 12/30/2021 10:20 AM   Modules accepted: Orders

## 2021-12-30 NOTE — Progress Notes (Signed)
Subjective:    Patient ID: Gabriella Riley, female    DOB: Mar 18, 1976, 46 y.o.   MRN: [id number]  HPI  Patient presents to clinic today with complaint of swelling of her lips,the tip of her tongue, and generalized itching.  She reports this occurred last week. She is not sure what caused this to occur. She did not have any difficulty breathing, shortness of breath or chest pain. She took Benadryl with complete resolution of symptoms. She reports she did wear a new mouth guard which she thinks may have been contributing to her symptoms. She has never had this happen before. She denies changes in soaps, lotions or detergents. No one in her home has similar symptoms. She denies new pets in the home.  She also reports left breast tingling and burning.  She reports this started about 1 month ago.  She reports it is constant but seems worse if she lays on that side.  She has tried wearing a bra at night to see if this helps but then she ends up having to take the bra off.  She has not noticed any changes in the skin of the left breast.  She denies nipple discharge.  Her last mammogram was 03/2021.    Review of Systems  Past Medical History:  Diagnosis Date   Anemia    Arthritis    Back pain    Bilateral swelling of feet and ankles    Constipation    Food allergy    GERD (gastroesophageal reflux disease)    History of blood clots    Hyperlipidemia    Hypertension    Joint pain    Lactose intolerance    Other fatigue    Shortness of breath    Shortness of breath on exertion    Vitamin D deficiency     Current Outpatient Medications  Medication Sig Dispense Refill   acetaminophen (TYLENOL) 500 MG tablet Take 500-1,000 mg by mouth every 6 (six) hours as needed (for pain.).     chlorthalidone (HYGROTON) 25 MG tablet Take 1 tablet (25 mg total) by mouth daily. 30 tablet 0   clobetasol (TEMOVATE) 0.05 % external solution Apply 1 application topically 2 (two) times daily. 50 mL 0    ivermectin (STROMECTOL) 3 MG TABS tablet Take 7 tablets today, then take a second dose in 1 week 14 tablet 0   Multiple Vitamins-Minerals (ADULT GUMMY PO) Take 2 tablets by mouth daily.     permethrin (ELIMITE) 5 % cream Apply to affected area once, and repeat in 1-2 weeks 60 g 0   tirzepatide (MOUNJARO) 7.5 MG/0.5ML Pen Inject 7.5 mg into the skin once a week. 2 mL 0   Vitamin D, Ergocalciferol, (DRISDOL) 1.25 MG (50000 UNIT) CAPS capsule Take 1 capsule (50,000 Units total) by mouth every 7 (seven) days. 4 capsule 0   No current facility-administered medications for this visit.    Allergies  Allergen Reactions   Shellfish Allergy    Latex Rash    Family History  Problem Relation Age of Onset   Obesity Father    Sleep apnea Father    Heart disease Father    Diabetes Father    Hyperlipidemia Father    Hypertension Father    Heart failure Father    Arthritis Maternal Grandmother    Heart disease Other    Sleep apnea Other    Obesity Other     Social History   Socioeconomic History   Marital  status: Married    Spouse name: Will   Number of children: 4   Years of education: Not on file   Highest education level: Not on file  Occupational History   Occupation: Multimedia programmer    Employer: [employer]  Tobacco Use   Smoking status: Never   Smokeless tobacco: Never  Vaping Use   Vaping Use: Never used  Substance and Sexual Activity   Alcohol use: Yes    Alcohol/week: 0.0 standard drinks of alcohol    Comment: social   Drug use: No   Sexual activity: Yes    Birth control/protection: None, Surgical    Comment: tubal ligation   Other Topics Concern   Not on file  Social History Narrative   Not on file   Social Determinants of Health   Financial Resource Strain: Not on file  Food Insecurity: Not on file  Transportation Needs: Not on file  Physical Activity: Not on file  Stress: Not on file  Social Connections: Not on file  Intimate  Partner Violence: Not on file     Constitutional: Denies fever, malaise, fatigue, headache or abrupt weight changes.  HEENT: Patient reports swelling of lips and tongue.  Denies eye pain, eye redness, ear pain, ringing in the ears, wax buildup, runny nose, nasal congestion, bloody nose, or sore throat. Respiratory: Denies difficulty breathing, shortness of breath, cough or sputum production.   Cardiovascular: Denies chest pain, chest tightness, palpitations or swelling in the hands or feet.  Skin: Denies redness, rashes, lesions or ulcercations.  Neurological: Patient reports numbness and burning of her left breast.  Denies dizziness, difficulty with memory, difficulty with speech or problems with balance and coordination.    No other specific complaints in a complete review of systems (except as listed in HPI above).     Objective:   Physical Exam  BP 134/88 (BP Location: Left Arm, Patient Position: Sitting, Cuff Size: Large)   Pulse 80   Temp (!) 96.8 F (36 C) (Temporal)   Wt 269 lb (122 kg)   SpO2 100%   BMI 42.13 kg/m   Wt Readings from Last 3 Encounters:  09/03/21 250 lb (113.4 kg)  07/16/21 253 lb (114.8 kg)  06/18/21 254 lb (115.2 kg)    General: Appears her stated age, obese, in NAD. Skin: Warm, dry and intact. No lumps, masses or rashes noted of the left breast. HEENT: Head: normal shape and size; Eyes: sclera white, no icterus, conjunctiva pink, PERRLA and EOMs intact; Throat/Mouth: Teeth present, mucosa pink and moist, no exudate, lesions or ulcerations noted.  Neck:  Neck supple, trachea midline. No masses, lumps or thyromegaly present.  Cardiovascular: Normal rate and rhythm. S1,S2 noted.  No murmur, rubs or gallops noted.  Pulmonary/Chest: Normal effort and positive vesicular breath sounds. No respiratory distress. No wheezes, rales or ronchi noted.  Musculoskeletal: No difficulty with gait.  Neurological: Alert and oriented.   BMET    Component Value  Date/Time   NA 138 12/19/2020 1053   NA 140 09/20/2020 0844   K 4.3 12/19/2020 1053   CL 106 12/19/2020 1053   CO2 22 12/19/2020 1053   GLUCOSE 72 12/19/2020 1053   BUN 13 12/19/2020 1053   BUN 11 09/20/2020 0844   CREATININE 1.04 (H) 12/19/2020 1053   CALCIUM 9.3 12/19/2020 1053   GFRNONAA 80 09/28/2017 1005   GFRAA 92 09/28/2017 1005    Lipid Panel     Component Value Date/Time   CHOL 186  09/20/2020 0844   TRIG 66 09/20/2020 0844   HDL 47 09/20/2020 0844   CHOLHDL 3.1 09/28/2017 1005   LDLCALC 127 (H) 09/20/2020 0844    CBC    Component Value Date/Time   WBC 9.6 12/19/2020 1053   RBC 5.76 (H) 12/19/2020 1053   HGB 12.3 12/19/2020 1053   HGB 11.7 09/20/2020 0844   HCT 40.7 12/19/2020 1053   HCT 39.8 09/20/2020 0844   PLT 329 12/19/2020 1053   PLT 335 09/20/2020 0844   MCV 70.7 (L) 12/19/2020 1053   MCV 72 (L) 09/20/2020 0844   MCH 21.4 (L) 12/19/2020 1053   MCHC 30.2 (L) 12/19/2020 1053   RDW 16.4 (H) 12/19/2020 1053   RDW 18.1 (H) 09/20/2020 0844   LYMPHSABS 2.0 09/20/2020 0844   EOSABS 0.3 09/20/2020 0844   BASOSABS 0.0 09/20/2020 0844    Hgb A1C Lab Results  Component Value Date   HGBA1C 5.9 (H) 09/20/2020            Assessment & Plan:   Angioedema, Pruritus:  She stopped wearing the mouthguard and symptoms have not recurred 80 mg Depo-Medrol IM for pruritus Encouraged Zyrtec or Benadryl OTC as needed for itching Referral to allergy placed per her request for allergy testing  Left Breast Pain:  We will obtain diagnostic mammogram of both breast We will obtain ultrasound of left breast  Schedule an appointment for your annual exam Webb Silversmith, NP

## 2021-12-30 NOTE — Patient Instructions (Signed)
Angioedema Angioedema is swelling in the body. The swelling can occur in any part of the body. It can affect any part of the body, including the legs, hands, genitals, face, mouth, lips, and organs. It may cause itchy, red, swollen areas of skin (hives) to form. This condition may: Happen only one time. Happen more than one time. It can also stop at any time. Keep coming back for a number of years. Someday it may stop. What are the causes? This condition may be caused by: Foods, such as milk, eggs, shellfish, wheat, or nuts. Medicines, such as ACE inhibitors, antibiotics, birth control pills, dyes used in X-rays or NSAIDs such as ibuprofen. Hereditary angioedema (HAE) is passed from parent to child. Symptoms can occur because of: Illness. Infection. Stress. Changes in hormones. Exercise. Minor surgery. Dental work. In some cases, the cause of this condition may not be known. What increases the risk? You are more likely to have HAE if you have family members with this condition. What are the signs or symptoms? Symptoms of this condition include: Swollen skin. Itchy, red, swollen areas of skin. Pain, pressure, or tenderness in the affected area. Swollen eyelids, face, lips, or tongue. Trouble drinking, swallowing, or fully closing the mouth. Being hoarse or having a sore throat. Wheezing. Trouble breathing. If your organs are affected, you may: Feel like vomiting. Have pain in your belly (abdomen). Vomit or have watery poop (diarrhea). Have trouble swallowing. Have trouble peeing. How is this treated? To treat this condition, you may be told: To avoid things that cause attacks (triggers). These include foods or things that cause allergies. To stop medicines that cause the condition. To take medicines to treat the condition. In very bad cases, a breathing tube or a machine that helps with breathing (ventilator) may be used. Follow these instructions at home:  Take all  medicines only as told by your doctor. If you were given medicines to treat allergies, always carry them with you. Wear a medical bracelet as told by your doctor. Avoid the things that cause attacks. These may include: Foods. Things in your environment (such as pollen). Stress. Exercise. Avoid all medicines that caused the attacks. Talk to your doctor before you have kids. Some types of this condition may be passed from parent to child. Where to find more information American Academy of [employer]ergy [employer]hma & Immunology: www.aaaai.org Contact a doctor if: You have another attack. Your attacks happen more often, even after you take steps to prevent them. Your attacks are worse every time they occur. You are thinking about having kids. Get help right away if: Your mouth, tongue, or lips get very swollen. Your swelling gets worse. You have trouble breathing or swallowing. You have trouble talking. You have chest pain. You feel dizzy. You feel light-headed. You faint. These symptoms may be an emergency. Get help right away. Call your local emergency services (911 in the [place] Do not wait to see if the symptoms will go away. Do not drive yourself to the hospital. Summary Angioedema is swelling in the body. Angioedema can be caused by the food you eat or the medicines you take. Avoid the things that cause your attacks. These can be food, medicines, or things in your environment. If you were given medicines for allergies, always carry them with you. Get help right away if your mouth, tongue, or lips get swollen. Also, get help right away if you have trouble breathing or swallowing. This information is not intended to replace advice given   to you by your health care provider. Make sure you discuss any questions you have with your health care provider. Document Revised: 07/11/2020 Document Reviewed: 07/11/2020 Elsevier Patient Education  2023 [employer]  

## 2022-01-08 ENCOUNTER — Other Ambulatory Visit: Payer: BC Managed Care – PPO

## 2022-01-10 ENCOUNTER — Encounter: Payer: Self-pay | Admitting: Gastroenterology

## 2022-01-13 ENCOUNTER — Ambulatory Visit
Admission: RE | Admit: 2022-01-13 | Discharge: 2022-01-13 | Disposition: A | Discharge status: Home / Self Care | Payer: BC Managed Care – PPO | Source: Ambulatory Visit | Attending: Internal Medicine | Admitting: Internal Medicine

## 2022-01-13 ENCOUNTER — Ambulatory Visit: Admission: RE | Admit: 2022-01-13 | Payer: BC Managed Care – PPO | Source: Ambulatory Visit

## 2022-01-13 DIAGNOSIS — N644 Mastodynia: Secondary | ICD-10-CM

## 2022-01-27 ENCOUNTER — Ambulatory Visit (AMBULATORY_SURGERY_CENTER): Payer: BC Managed Care – PPO

## 2022-01-27 VITALS — Ht 67.0 in | Wt 266.0 lb

## 2022-01-27 DIAGNOSIS — Z1211 Encounter for screening for malignant neoplasm of colon: Secondary | ICD-10-CM

## 2022-01-27 MED ORDER — NA SULFATE-K SULFATE-MG SULF 17.5-3.13-1.6 GM/177ML PO SOLN: 1.0000 | Freq: Once | ORAL | 0 refills | Status: AC

## 2022-01-27 NOTE — Progress Notes (Signed)

## 2022-01-31 ENCOUNTER — Ambulatory Visit (INDEPENDENT_AMBULATORY_CARE_PROVIDER_SITE_OTHER): Payer: BC Managed Care – PPO | Admitting: Internal Medicine

## 2022-01-31 ENCOUNTER — Encounter: Payer: Self-pay | Admitting: Internal Medicine

## 2022-01-31 VITALS — BP 134/86 | HR 67 | Temp 96.9°F | Ht 67.0 in | Wt 266.0 lb

## 2022-01-31 DIAGNOSIS — Z6841 Body Mass Index (BMI) 40.0 and over, adult: Secondary | ICD-10-CM

## 2022-01-31 DIAGNOSIS — R7303 Prediabetes: Secondary | ICD-10-CM

## 2022-01-31 DIAGNOSIS — Z0001 Encounter for general adult medical examination with abnormal findings: Secondary | ICD-10-CM | POA: Diagnosis not present

## 2022-01-31 NOTE — Patient Instructions (Signed)

## 2022-01-31 NOTE — Assessment & Plan Note (Signed)
Encourage diet and exercise for weight loss Advised her to consider B12/hCG injections versus phentermine/Qsymia

## 2022-01-31 NOTE — Progress Notes (Signed)
Subjective:    Patient ID: Gabriella Riley, female    DOB: 1975/04/09, 46 y.o.   MRN: [id number]  HPI  Patient presents to clinic today for her annual exam.  Flu: Never Tetanus: 06/2017 COVID: Pfizer x3 Pap smear: 05/2021 Mammogram: 12/2021 Colon screening: Never, scheduled 11/30 Vision screening: annually Dentist: biannually  Diet: She does eat meat. She consumes fruits and veggies. She does eat some fried foods. She drinks mostly water, soda Exercise: None  Review of Systems     Past Medical History:  Diagnosis Date   Anemia    Arthritis    Back pain    Bilateral swelling of feet and ankles    Constipation    Food allergy    GERD (gastroesophageal reflux disease)    History of blood clots    Hyperlipidemia    Hypertension    Joint pain    Lactose intolerance    Other fatigue    Shortness of breath    Shortness of breath on exertion    Vitamin D deficiency     Current Outpatient Medications  Medication Sig Dispense Refill   acetaminophen (TYLENOL) 500 MG tablet Take 500-1,000 mg by mouth every 6 (six) hours as needed (for pain.). (Patient not taking: Reported on 01/27/2022)     albuterol (VENTOLIN HFA) 108 (90 Base) MCG/ACT inhaler Inhale 2 puffs into the lungs every 6 (six) hours as needed for wheezing or shortness of breath. 8 g 0   chlorthalidone (HYGROTON) 25 MG tablet Take 1 tablet (25 mg total) by mouth daily. 30 tablet 0   Multiple Vitamins-Minerals (ADULT GUMMY PO) Take 2 tablets by mouth daily.     Vitamin D, Ergocalciferol, (DRISDOL) 1.25 MG (50000 UNIT) CAPS capsule Take 1 capsule (50,000 Units total) by mouth every 7 (seven) days. 4 capsule 0   No current facility-administered medications for this visit.    Allergies  Allergen Reactions   Shellfish Allergy    Latex Rash    Family History  Problem Relation Age of Onset   Obesity Father    Sleep apnea Father    Heart disease Father    Diabetes Father    Hyperlipidemia Father     Hypertension Father    Heart failure Father    Arthritis Maternal Grandmother    Heart disease Other    Sleep apnea Other    Obesity Other    Colon cancer Neg Hx    Colon polyps Neg Hx    Esophageal cancer Neg Hx    Rectal cancer Neg Hx    Stomach cancer Neg Hx     Social History   Socioeconomic History   Marital status: Married    Spouse name: Will   Number of children: 4   Years of education: Not on file   Highest education level: Not on file  Occupational History   Occupation: Multimedia programmer    Employer: [employer]  Tobacco Use   Smoking status: Never   Smokeless tobacco: Never  Vaping Use   Vaping Use: Never used  Substance and Sexual Activity   Alcohol use: Yes    Alcohol/week: 0.0 standard drinks of alcohol    Comment: social   Drug use: No   Sexual activity: Yes    Birth control/protection: None, Surgical    Comment: tubal ligation   Other Topics Concern   Not on file  Social History Narrative   Not on file   Social Determinants of Health  Financial Resource Strain: Not on file  Food Insecurity: Not on file  Transportation Needs: Not on file  Physical Activity: Not on file  Stress: Not on file  Social Connections: Not on file  Intimate Partner Violence: Not on file     Constitutional: Denies fever, malaise, fatigue, headache or abrupt weight changes.  HEENT: Denies eye pain, eye redness, ear pain, ringing in the ears, wax buildup, runny nose, nasal congestion, bloody nose, or sore throat. Respiratory: Denies difficulty breathing, shortness of breath, cough or sputum production.   Cardiovascular: Denies chest pain, chest tightness, palpitations or swelling in the hands or feet.  Gastrointestinal: Pt reports reflux. Denies abdominal pain, bloating, constipation, diarrhea or blood in the stool.  GU: Denies urgency, frequency, pain with urination, burning sensation, blood in urine, odor or discharge. Musculoskeletal: Patient  reports knee pain.  Denies decrease in range of motion, difficulty with gait, muscle pain or joint swelling.  Skin: Denies redness, rashes, lesions or ulcercations.  Neurological: Denies dizziness, difficulty with memory, difficulty with speech or problems with balance and coordination.  Psych: Pt reports anxiety. Denies depression, SI/HI.  No other specific complaints in a complete review of systems (except as listed in HPI above).  Objective:   Physical Exam  BP 134/86 (BP Location: Left Arm, Patient Position: Sitting, Cuff Size: Large)   Pulse 67   Temp (!) 96.9 F (36.1 C) (Temporal)   Ht _0  (1.702 m)   Wt 266 lb (120.7 kg)   SpO2 100%   BMI 41.66 kg/m   Wt Readings from Last 3 Encounters:  01/27/22 266 lb (120.7 kg)  12/30/21 269 lb (122 kg)  09/03/21 250 lb (113.4 kg)    General: Appears her stated age, obese, in NAD. Skin: Warm, dry and intact. No rashes noted. HEENT: Head: normal shape and size; Eyes: sclera white, no icterus, conjunctiva pink, PERRLA and EOMs intact;  Neck:  Neck supple, trachea midline. No masses, lumps or thyromegaly present.  Cardiovascular: Normal rate and rhythm. S1,S2 noted.  No murmur, rubs or gallops noted. No JVD or BLE edema.  Pulmonary/Chest: Normal effort and positive vesicular breath sounds. No respiratory distress. No wheezes, rales or ronchi noted.  Abdomen: Normal bowel sounds.  Musculoskeletal: Strength 5/5 BUE/BLE.  No difficulty with gait.  Neurological: Alert and oriented. Cranial nerves II-XII grossly intact. Coordination normal.  Psychiatric: Mood and affect normal. Behavior is normal. Judgment and thought content normal.     BMET    Component Value Date/Time   NA 138 12/19/2020 1053   NA 140 09/20/2020 0844   K 4.3 12/19/2020 1053   CL 106 12/19/2020 1053   CO2 22 12/19/2020 1053   GLUCOSE 72 12/19/2020 1053   BUN 13 12/19/2020 1053   BUN 11 09/20/2020 0844   CREATININE 1.04 (H) 12/19/2020 1053   CALCIUM 9.3  12/19/2020 1053   GFRNONAA 80 09/28/2017 1005   GFRAA 92 09/28/2017 1005    Lipid Panel     Component Value Date/Time   CHOL 186 09/20/2020 0844   TRIG 66 09/20/2020 0844   HDL 47 09/20/2020 0844   CHOLHDL 3.1 09/28/2017 1005   LDLCALC 127 (H) 09/20/2020 0844    CBC    Component Value Date/Time   WBC 9.6 12/19/2020 1053   RBC 5.76 (H) 12/19/2020 1053   HGB 12.3 12/19/2020 1053   HGB 11.7 09/20/2020 0844   HCT 40.7 12/19/2020 1053   HCT 39.8 09/20/2020 0844   PLT 329 12/19/2020 1053  PLT 335 09/20/2020 0844   MCV 70.7 (L) 12/19/2020 1053   MCV 72 (L) 09/20/2020 0844   MCH 21.4 (L) 12/19/2020 1053   MCHC 30.2 (L) 12/19/2020 1053   RDW 16.4 (H) 12/19/2020 1053   RDW 18.1 (H) 09/20/2020 0844   LYMPHSABS 2.0 09/20/2020 0844   EOSABS 0.3 09/20/2020 0844   BASOSABS 0.0 09/20/2020 0844    Hgb A1C Lab Results  Component Value Date   HGBA1C 5.9 (H) 09/20/2020           Assessment & Plan:   Preventative Health Maintenance:  She declines flu shots Tetanus UTD Encouraged her to get her COVID booster Pap smear UTD Mammogram UTD Colonoscopy already scheduled Encouraged her to consume a balanced diet and exercise regimen Advised her to see an eye doctor and dentist annually We will check CBC, c-Met, lipid and A1c today  RTC in 6 months, follow-up chronic conditions Webb Silversmith, NP

## 2022-02-01 LAB — CBC
HCT: 39.1 % (ref 35.0–45.0)
Hemoglobin: 12.2 g/dL (ref 11.7–15.5)
MCH: 23 pg — ABNORMAL LOW (ref 27.0–33.0)
MCHC: 31.2 g/dL — ABNORMAL LOW (ref 32.0–36.0)
MCV: 73.6 fL — ABNORMAL LOW (ref 80.0–100.0)
MPV: 12.4 fL (ref 7.5–12.5)
Platelets: 280 10*3/uL (ref 140–400)
RBC: 5.31 10*6/uL — ABNORMAL HIGH (ref 3.80–5.10)
RDW: 15 % (ref 11.0–15.0)
WBC: 10 10*3/uL (ref 3.8–10.8)

## 2022-02-01 LAB — LIPID PANEL
Cholesterol: 189 mg/dL (ref <200)
HDL: 54 mg/dL (ref >=50)
LDL Cholesterol (Calc): 120 mg/dL (calc) — ABNORMAL HIGH
Non-HDL Cholesterol (Calc): 135 mg/dL (calc) — ABNORMAL HIGH (ref <130)
Total CHOL/HDL Ratio: 3.5 (calc) (ref <5.0)
Triglycerides: 48 mg/dL (ref <150)

## 2022-02-01 LAB — HEMOGLOBIN A1C
Hgb A1c MFr Bld: 5.7 % of total Hgb — ABNORMAL HIGH (ref <5.7)
Mean Plasma Glucose: 117 mg/dL
eAG (mmol/L): 6.5 mmol/L

## 2022-02-01 LAB — COMPLETE METABOLIC PANEL WITH GFR
AG Ratio: 1.3 (calc) (ref 1.0–2.5)
ALT: 13 U/L (ref 6–29)
AST: 11 U/L (ref 10–35)
Albumin: 3.7 g/dL (ref 3.6–5.1)
Alkaline phosphatase (APISO): 75 U/L (ref 31–125)
BUN: 17 mg/dL (ref 7–25)
CO2: 28 mmol/L (ref 20–32)
Calcium: 9.2 mg/dL (ref 8.6–10.2)
Chloride: 105 mmol/L (ref 98–110)
Creat: 0.92 mg/dL (ref 0.50–0.99)
Globulin: 2.9 g/dL (calc) (ref 1.9–3.7)
Glucose, Bld: 78 mg/dL (ref 65–99)
Potassium: 4.3 mmol/L (ref 3.5–5.3)
Sodium: 139 mmol/L (ref 135–146)
Total Bilirubin: 0.3 mg/dL (ref 0.2–1.2)
Total Protein: 6.6 g/dL (ref 6.1–8.1)
eGFR: 78 mL/min/{1.73_m2} (ref >=60)

## 2022-02-12 ENCOUNTER — Encounter: Payer: Self-pay | Admitting: Gastroenterology

## 2022-02-16 ENCOUNTER — Encounter: Payer: Self-pay | Admitting: Certified Registered Nurse Anesthetist

## 2022-02-18 ENCOUNTER — Encounter: Payer: Self-pay | Admitting: Internal Medicine

## 2022-02-18 ENCOUNTER — Ambulatory Visit (INDEPENDENT_AMBULATORY_CARE_PROVIDER_SITE_OTHER): Payer: BC Managed Care – PPO | Admitting: Internal Medicine

## 2022-02-18 VITALS — BP 138/90 | HR 63 | Temp 98.1°F | Resp 16 | Ht 66.93 in | Wt 265.6 lb

## 2022-02-18 DIAGNOSIS — L501 Idiopathic urticaria: Secondary | ICD-10-CM

## 2022-02-18 DIAGNOSIS — H1013 Acute atopic conjunctivitis, bilateral: Secondary | ICD-10-CM | POA: Diagnosis not present

## 2022-02-18 DIAGNOSIS — J3089 Other allergic rhinitis: Secondary | ICD-10-CM | POA: Diagnosis not present

## 2022-02-18 DIAGNOSIS — J302 Other seasonal allergic rhinitis: Secondary | ICD-10-CM

## 2022-02-18 MED ORDER — FEXOFENADINE HCL 180 MG PO TABS: 180.0000 mg | ORAL_TABLET | Freq: Two times a day (BID) | ORAL | 5 refills | Status: DC

## 2022-02-18 MED ORDER — FAMOTIDINE 20 MG PO TABS: 20.0000 mg | ORAL_TABLET | Freq: Two times a day (BID) | ORAL | 5 refills | Status: DC

## 2022-02-18 MED ORDER — OLOPATADINE HCL 0.2 % OP SOLN
1.0000 [drp] | Freq: Every day | OPHTHALMIC | 5 refills | Status: DC | PRN
Start: 2022-02-18 — End: 2022-06-17

## 2022-02-18 MED ORDER — FLUTICASONE PROPIONATE 50 MCG/ACT NA SUSP: 2.0000 | Freq: Every day | NASAL | 5 refills | Status: DC

## 2022-02-18 NOTE — Patient Instructions (Addendum)
Idiopathic Urticaria (Hives): - Start Allegra '180mg'$  daily if the hives return. - If no improvement in 1-2 weeks, increase to Allegra '180mg'$  twice daily.   -If no improvement in 1-2 weeks, add Pepcid '20mg'$  twice daily and continue Allegra '180mg'$  twice daily. -If still no improvement, increase to Allegra '360mg'$  twice daily and Pepcid '40mg'$  twice daily.   Rhinitis: - Positive skin test 01/2022: trees, ragweed, dust mite - Avoidance measures discussed. - Use nasal saline rinses before nose sprays such as with Neilmed Sinus Rinse.  Use distilled water.   - Use Flonase 2 sprays each nostril daily. Aim upward and outward. - Use Allegra '180mg'$  daily.  - For eyes, use Olopatadine or Ketotifen 1 eye drops daily as needed.  Available over the counter, if not covered by insurance.  - Consider allergy shots as long term control of your symptoms by teaching your immune system to be more tolerant of your allergy triggers  ALLERGEN AVOIDANCE MEASURES   Dust Mites Use central air conditioning and heat; and change the filter monthly.  Pleated filters work better than mesh filters.  Electrostatic filters may also be used; wash the filter monthly.  Window air conditioners may be used, but do not clean the air as well as a central air conditioner.  Change or wash the filter monthly. Keep windows closed.  Do not use attic fans.   Encase the mattress, box springs and pillows with zippered, dust proof covers. Wash the bed linens in hot water weekly.   Remove carpet, especially from the bedroom. Remove stuffed animals, throw pillows, dust ruffles, heavy drapes and other items that collect dust from the bedroom. Do not use a humidifier.   Use wood, vinyl or leather furniture instead of cloth furniture in the bedroom. Keep the indoor humidity at 30 - 40%.  Monitor with a humidity gauge.  Pollen Avoidance Pollen levels are highest during the mid-day and afternoon.  Consider this when planning outdoor  activities. Avoid being outside when the grass is being mowed, or wear a mask if the pollen-allergic person must be the one to mow the grass. Keep the windows closed to keep pollen outside of the home. Use an air conditioner to filter the air. Take a shower, wash hair, and change clothing after working or playing outdoors during pollen season.

## 2022-02-18 NOTE — Progress Notes (Signed)
NEW PATIENT  Date of Service/Encounter:  02/18/22  Consult requested by: Jearld Fenton, NP   Subjective:   Gabriella Riley (DOB: [birth year]) is a 46 y.o. female who presents to the clinic on 02/18/2022 with a chief complaint of Angioedema (Cause unknown.) and Urticaria .    History obtained from: chart review and patient.   Hives/Angioedema Reports onset around early October.  She was not ill at the time.  No new products or medications.  She did use a new latex free mouthguard but since then has stopped it and is still having swelling/hives.  Reports the swelling is usually of her lips and either R or L side and either upper or bottom lip.  The hives are itchy, red and swollen; no scarring or pain.  Last episode of hives was 3rd week of November; they are happening about 1x/week and last about a day.  She uses benadryl PRN which does help reduce the intensity.  She did take one dose of Zyrtec but it did not help as much.   Rhinitis:  Started in the past few years.  Symptoms include: nasal congestion, rhinorrhea, post nasal drainage, sneezing, watery eyes, and itchy eyes  Occurs seasonally-Fall/Spring Potential triggers: not sure Treatments tried:  none Previous allergy testing: yes but it was in childhood and she can't recall the results History of reflux/heartburn: no History of chronic sinusitis or sinus surgery: no  Past Medical History: Past Medical History:  Diagnosis Date   Anemia    Angio-edema    Arthritis    Back pain    Bilateral swelling of feet and ankles    Constipation    Food allergy    GERD (gastroesophageal reflux disease)    History of blood clots    Hyperlipidemia    Hypertension    Joint pain    Lactose intolerance    Other fatigue    Shortness of breath    Shortness of breath on exertion    Urticaria    Vitamin D deficiency     Past Surgical History: Past Surgical History:  Procedure Laterality Date   BREAST BIOPSY Left 2007   BREAST  EXCISIONAL BIOPSY     TUBAL LIGATION      Family History: Family History  Problem Relation Age of Onset   Allergic rhinitis Mother    Healthy Mother    Obesity Father    Sleep apnea Father    Heart disease Father    Diabetes Father    Hyperlipidemia Father    Hypertension Father    Heart failure Father    Healthy Sister    Prostate cancer Brother    Heart attack Brother    Arthritis Maternal Grandmother    Heart disease Other    Sleep apnea Other    Obesity Other    Colon cancer Neg Hx    Colon polyps Neg Hx    Esophageal cancer Neg Hx    Rectal cancer Neg Hx    Stomach cancer Neg Hx     Social History:  Lives in a 2006 year house Flooring in bedroom: Charity fundraiser Pets: none Tobacco use/exposure: none Job: Environmental consultant principal   Medication List:  Allergies as of 02/18/2022       Reactions   Shellfish Allergy    Latex Rash        Medication List        Accurate as of February 18, 2022  5:42 PM. If you have any questions,  ask your nurse or doctor.          acetaminophen 500 MG tablet Commonly known as: TYLENOL Take 500-1,000 mg by mouth every 6 (six) hours as needed (for pain.).   ADULT GUMMY PO Take 2 tablets by mouth daily.   albuterol 108 (90 Base) MCG/ACT inhaler Commonly known as: VENTOLIN HFA Inhale 2 puffs into the lungs every 6 (six) hours as needed for wheezing or shortness of breath.   chlorthalidone 25 MG tablet Commonly known as: HYGROTON Take 1 tablet (25 mg total) by mouth daily.   famotidine 20 MG tablet Commonly known as: Pepcid Take 1 tablet (20 mg total) by mouth 2 (two) times daily. Started by: Larose Kells, MD   fexofenadine 180 MG tablet Commonly known as: Allegra Allergy Take 1 tablet (180 mg total) by mouth in the morning and at bedtime. Started by: Larose Kells, MD   fluticasone 50 MCG/ACT nasal spray Commonly known as: FLONASE Place 2 sprays into both nostrils daily. Started by: Larose Kells, MD   Olopatadine  HCl 0.2 % Soln Apply 1 drop to eye daily as needed. Started by: Larose Kells, MD   Vitamin D (Ergocalciferol) 1.25 MG (50000 UNIT) Caps capsule Commonly known as: DRISDOL Take 1 capsule (50,000 Units total) by mouth every 7 (seven) days.         REVIEW OF SYSTEMS: Pertinent positives and negatives discussed in HPI.   Objective:   Physical Exam: BP (!) 138/90   Pulse 63   Temp 98.1 F (36.7 C)   Resp 16   Ht 5' 6.93" (1.7 m)   Wt 265 lb 9.6 oz (120.5 kg)   SpO2 100%   BMI 41.69 kg/m  Body mass index is 41.69 kg/m. GEN: alert, well developed HEENT: clear conjunctiva, TM grey and translucent, nose with + inferior turbinate hypertrophy, pink nasal mucosa, slight clear rhinorrhea, + cobblestoning HEART: regular rate and rhythm, no murmur LUNGS: clear to auscultation bilaterally, no coughing, unlabored respiration ABDOMEN: soft, non distended  SKIN: no rashes or lesions  Reviewed:  12/30/2021: PCP visit for angioedema; given depo medrol IM; zyrtec or benadryl PRN; allergy referral  Skin Testing:  Skin prick testing was placed, which includes aeroallergens/foods, histamine control, and saline control.  Verbal consent was obtained prior to placing test.  Patient tolerated procedure well.  Allergy testing results were read and interpreted by myself, documented by clinical staff. Adequate positive and negative control.  Results discussed with patient/family.  Airborne Adult Perc - 02/18/22 1457     Time Antigen Placed 1502    Allergen Manufacturer Lavella Hammock    Location Back    Number of Test 59    1. Control-Buffer 50% Glycerol Negative    2. Control-Histamine 1 mg/ml 3+    3. Albumin saline Negative    4. Crestwood Negative    5. [place] Negative    6. Johnson Negative    7. Grill Blue Negative    8. Meadow Fescue Negative    9. Perennial Rye Negative    10. Sweet Vernal Negative    11. Timothy Negative    12. Cocklebur Negative    13. Burweed Marshelder Negative     14. Ragweed, short Negative    15. Ragweed, Giant Negative    16. Plantain,  English Negative    17. Lamb's Quarters Negative    18. Sheep Sorrell Negative    19. Rough Pigweed Negative    20. Skeet Simmer Elder, Rough Negative  21. Mugwort, Common Negative    22. Ash mix Negative    23. Birch mix Negative    24. Beech American Negative    25. Box, Elder Negative    26. Cedar, red Negative    27. Cottonwood, [place] Negative    28. Elm mix Negative    29. Hickory Negative    30. Maple mix Negative    31. Oak, [place] mix Negative    32. Pecan Pollen Negative    33. Pine mix Negative    34. Sycamore Eastern Negative    35. Coleman, Black Pollen Negative    36. Alternaria alternata Negative    37. Cladosporium Herbarum Negative    38. Aspergillus mix Negative    39. Penicillium mix Negative    40. Bipolaris sorokiniana (Helminthosporium) Negative    41. Drechslera spicifera (Curvularia) Negative    42. Mucor plumbeus Negative    43. Fusarium moniliforme Negative    44. Aureobasidium pullulans (pullulara) Negative    45. Rhizopus oryzae Negative    46. Botrytis cinera Negative    47. Epicoccum nigrum Negative    48. Phoma betae Negative    49. Candida Albicans Negative    50. Trichophyton mentagrophytes Negative    51. Mite, D Farinae  5,000 AU/ml Negative    52. Mite, D Pteronyssinus  5,000 AU/ml Negative    53. Cat Hair 10,000 BAU/ml Negative    54.  Dog Epithelia Negative    55. Mixed Feathers Negative    56. Horse Epithelia Negative    57. Cockroach, German Negative    58. Mouse Negative    59. Tobacco Leaf Negative             Intradermal - 02/18/22 1637     Time Antigen Placed 1612    Allergen Manufacturer Lavella Hammock    Location Back    Number of Test 15    Control Negative    Guatemala Negative    Johnson Negative    7 Grass Negative    Ragweed mix 3+    Weed mix Negative    Tree mix 3+    Mold 1 Negative    Mold 2 Negative    Mold 3 Negative    Mold 4  Negative    Cat Negative    Dog Negative    Cockroach Negative    Mite mix 2+               Assessment:   1. Idiopathic urticaria   2. Seasonal and perennial allergic rhinitis   3. Allergic conjunctivitis of both eyes     Plan/Recommendations:  Idiopathic Urticaria/Angioedema (Hives & Swelling): - Start Allegra '180mg'$  daily if the hives return. - If no improvement in 1-2 weeks, increase to Allegra '180mg'$  twice daily.   -If no improvement in 1-2 weeks, add Pepcid '20mg'$  twice daily and continue Allegra '180mg'$  twice daily. -If still no improvement, increase to Allegra '360mg'$  twice daily and Pepcid '40mg'$  twice daily.   Allergic Rhinitis Allergic Conjunctivitis  - Positive skin test 01/2022: trees, ragweed, dust mite - Avoidance measures discussed. - Use nasal saline rinses before nose sprays such as with Neilmed Sinus Rinse.  Use distilled water.   - Use Flonase 2 sprays each nostril daily. Aim upward and outward. - Use Allegra '180mg'$  daily.  - For eyes, use Olopatadine or Ketotifen 1 eye drops daily as needed.  Available over the counter, if not covered by insurance.  - Consider allergy shots  as long term control of your symptoms by teaching your immune system to be more tolerant of your allergy triggers  Return in about 6 weeks (around 04/01/2022).  Harlon Flor, MD [facility]

## 2022-02-20 ENCOUNTER — Ambulatory Visit (AMBULATORY_SURGERY_CENTER): Payer: BC Managed Care – PPO | Admitting: Gastroenterology

## 2022-02-20 ENCOUNTER — Encounter: Payer: Self-pay | Admitting: Gastroenterology

## 2022-02-20 VITALS — BP 145/92 | HR 63 | Temp 98.4°F | Resp 17 | Ht 67.0 in | Wt 266.0 lb

## 2022-02-20 DIAGNOSIS — K635 Polyp of colon: Secondary | ICD-10-CM | POA: Diagnosis not present

## 2022-02-20 DIAGNOSIS — D128 Benign neoplasm of rectum: Secondary | ICD-10-CM | POA: Diagnosis not present

## 2022-02-20 DIAGNOSIS — Z1211 Encounter for screening for malignant neoplasm of colon: Secondary | ICD-10-CM | POA: Diagnosis present

## 2022-02-20 DIAGNOSIS — D123 Benign neoplasm of transverse colon: Secondary | ICD-10-CM | POA: Diagnosis not present

## 2022-02-20 DIAGNOSIS — K621 Rectal polyp: Secondary | ICD-10-CM | POA: Diagnosis not present

## 2022-02-20 MED ORDER — SODIUM CHLORIDE 0.9 % IV SOLN: 500.0000 mL | Freq: Once | INTRAVENOUS | Status: DC

## 2022-02-20 NOTE — Op Note (Signed)
[facility] Patient Name: Gabriella Riley Procedure Date: 02/20/2022 7:57 AM MRN: [id number] Endoscopist: Mallie Mussel L. Loletha Carrow , MD, [id number] Age: 46 Referring MD:  Date of Birth: [birth year] Gender: Female Account #: [id number] Procedure:                Colonoscopy Indications:              Screening for colorectal malignant neoplasm, This                            is the patient's first colonoscopy Medicines:                Monitored Anesthesia Care Procedure:                Pre-Anesthesia Assessment:                           - Prior to the procedure, a History and Physical                            was performed, and patient medications and                            allergies were reviewed. The patient's tolerance of                            previous anesthesia was also reviewed. The risks                            and benefits of the procedure and the sedation                            options and risks were discussed with the patient.                            All questions were answered, and informed consent                            was obtained. Prior Anticoagulants: The patient has                            taken no anticoagulant or antiplatelet agents. ASA                            Grade Assessment: III - A patient with severe                            systemic disease. After reviewing the risks and                            benefits, the patient was deemed in satisfactory                            condition to undergo the procedure.  After obtaining informed consent, the colonoscope                            was passed under direct vision. Throughout the                            procedure, the patient's blood pressure, pulse, and                            oxygen saturations were monitored continuously. The                            CF [id number] #[id number] was introduced through the anus                            and advanced to  the the cecum, identified by                            appendiceal orifice and ileocecal valve. The                            colonoscopy was performed without difficulty. The                            patient tolerated the procedure well. The quality                            of the bowel preparation was excellent. The                            ileocecal valve, appendiceal orifice, and rectum                            were photographed. The bowel preparation used was                            SUPREP. Scope In: 8:02:01 AM Scope Out: 8:16:38 AM Scope Withdrawal Time: 0 hours 11 minutes 59 seconds  Total Procedure Duration: 0 hours 14 minutes 37 seconds  Findings:                 The perianal and digital rectal examinations were                            normal.                           Two sessile polyps were found in the transverse                            colon. The polyps were 5 to 6 mm in size with mucus                            caps. These polyps were removed with a cold snare.  Resection and retrieval were complete.                           A diminutive polyp was found in the rectum. The                            polyp was semi-sessile. The polyp was removed with                            a cold snare. Resection and retrieval were complete.                           Repeat examination of right colon under NBI                            performed.                           A few diverticula were found in the left colon.                           The exam was otherwise without abnormality on                            direct and retroflexion views. Complications:            No immediate complications. Estimated Blood Loss:     Estimated blood loss was minimal. Impression:               - Two 5 to 6 mm polyps in the transverse colon,                            removed with a cold snare. Resected and retrieved.                           - One  diminutive polyp in the rectum, removed with                            a cold snare. Resected and retrieved.                           - Diverticulosis in the left colon.                           - The examination was otherwise normal on direct                            and retroflexion views. Recommendation:           - Patient has a contact number available for                            emergencies. The signs and symptoms of potential  delayed complications were discussed with the                            patient. Return to normal activities tomorrow.                            Written discharge instructions were provided to the                            patient.                           - Resume previous diet.                           - Continue present medications.                           - Await pathology results.                           - Repeat colonoscopy is recommended for                            surveillance. The colonoscopy date will be                            determined after pathology results from today's                            exam become available for review. Nimah Uphoff L. Loletha Carrow, MD 02/20/2022 8:20:26 AM This report has been signed electronically.

## 2022-02-20 NOTE — Progress Notes (Signed)
History and Physical:  This patient presents for endoscopic testing for: Encounter Diagnosis  Name Primary?   Special screening for malignant neoplasms, colon Yes    Average risk for CRC - first screening exam. Patient denies chronic abdominal pain, rectal bleeding, constipation or diarrhea.   Patient is otherwise without complaints or active issues today.   Past Medical History: Past Medical History:  Diagnosis Date   Anemia    Angio-edema    Arthritis    Back pain    Bilateral swelling of feet and ankles    Constipation    Food allergy    GERD (gastroesophageal reflux disease)    History of blood clots    Hyperlipidemia    Hypertension    Joint pain    Lactose intolerance    Other fatigue    Shortness of breath    Shortness of breath on exertion    Urticaria    Vitamin D deficiency      Past Surgical History: Past Surgical History:  Procedure Laterality Date   BREAST BIOPSY Left 2007   BREAST EXCISIONAL BIOPSY     TUBAL LIGATION      Allergies: Allergies  Allergen Reactions   Shellfish Allergy    Latex Rash    Outpatient Meds: Current Outpatient Medications  Medication Sig Dispense Refill   Multiple Vitamins-Minerals (ADULT GUMMY PO) Take 2 tablets by mouth daily.     acetaminophen (TYLENOL) 500 MG tablet Take 500-1,000 mg by mouth every 6 (six) hours as needed (for pain.).     albuterol (VENTOLIN HFA) 108 (90 Base) MCG/ACT inhaler Inhale 2 puffs into the lungs every 6 (six) hours as needed for wheezing or shortness of breath. 8 g 0   chlorthalidone (HYGROTON) 25 MG tablet Take 1 tablet (25 mg total) by mouth daily. 30 tablet 0   famotidine (PEPCID) 20 MG tablet Take 1 tablet (20 mg total) by mouth 2 (two) times daily. 40 tablet 5   fexofenadine (ALLEGRA ALLERGY) 180 MG tablet Take 1 tablet (180 mg total) by mouth in the morning and at bedtime. 60 tablet 5   fluticasone (FLONASE) 50 MCG/ACT nasal spray Place 2 sprays into both nostrils daily. 16 g 5    Olopatadine HCl 0.2 % SOLN Apply 1 drop to eye daily as needed. 2.5 mL 5   Vitamin D, Ergocalciferol, (DRISDOL) 1.25 MG (50000 UNIT) CAPS capsule Take 1 capsule (50,000 Units total) by mouth every 7 (seven) days. 4 capsule 0   Current Facility-Administered Medications  Medication Dose Route Frequency Provider Last Rate Last Admin   0.9 %  sodium chloride infusion  500 mL Intravenous Once Doran Stabler, MD          ___________________________________________________________________ Objective   Exam:  BP 123/77   Pulse 72   Temp 98.4 F (36.9 C)   Ht '5\' 7"'$  (1.702 m)   Wt 266 lb (120.7 kg)   SpO2 99%   BMI 41.66 kg/m   CV: regular , S1/S2 Resp: clear to auscultation bilaterally, normal RR and effort noted GI: soft, no tenderness, with active bowel sounds.   Assessment: Encounter Diagnosis  Name Primary?   Special screening for malignant neoplasms, colon Yes     Plan: Colonoscopy  The benefits and risks of the planned procedure were described in detail with the patient or (when appropriate) their health care proxy.  Risks were outlined as including, but not limited to, bleeding, infection, perforation, adverse medication reaction leading to cardiac or pulmonary  decompensation, pancreatitis (if ERCP).  The limitation of incomplete mucosal visualization was also discussed.  No guarantees or warranties were given.    The patient is appropriate for an endoscopic procedure in the ambulatory setting.   - Wilfrid Lund, MD

## 2022-02-20 NOTE — Progress Notes (Signed)
Sedate, gd SR, tolerated procedure well, VSS, report to RN 

## 2022-02-20 NOTE — Progress Notes (Signed)
Pt's states no medical or surgical changes since previsit or office visit. 

## 2022-02-20 NOTE — Patient Instructions (Signed)
Handout on polyps and diverticulosis given to patient.  Await pathology results.  Resume previous diet and continue present medications.  Repeat colonoscopy for surveillance will be determined based off of pathology results.   YOU HAD AN ENDOSCOPIC PROCEDURE TODAY AT THE [facility]:   Refer to the procedure report that was given to you for any specific questions about what was found during the examination.  If the procedure report does not answer your questions, please call your gastroenterologist to clarify.  If you requested that your care partner not be given the details of your procedure findings, then the procedure report has been included in a sealed envelope for you to review at your convenience later.  YOU SHOULD EXPECT: Some feelings of bloating in the abdomen. Passage of more gas than usual.  Walking can help get rid of the air that was put into your GI tract during the procedure and reduce the bloating. If you had a lower endoscopy (such as a colonoscopy or flexible sigmoidoscopy) you may notice spotting of blood in your stool or on the toilet paper. If you underwent a bowel prep for your procedure, you may not have a normal bowel movement for a few days.  Please Note:  You might notice some irritation and congestion in your nose or some drainage.  This is from the oxygen used during your procedure.  There is no need for concern and it should clear up in a day or so.  SYMPTOMS TO REPORT IMMEDIATELY:  Following lower endoscopy (colonoscopy or flexible sigmoidoscopy):  Excessive amounts of blood in the stool  Significant tenderness or worsening of abdominal pains  Swelling of the abdomen that is new, acute  Fever of 100F or higher  For urgent or emergent issues, a gastroenterologist can be reached at any hour by calling [phone]. Do not use MyChart messaging for urgent concerns.    DIET:  We do recommend a small meal at first, but then you may proceed to your  regular diet.  Drink plenty of fluids but you should avoid alcoholic beverages for 24 hours.  ACTIVITY:  You should plan to take it easy for the rest of today and you should NOT DRIVE or use heavy machinery until tomorrow (because of the sedation medicines used during the test).    FOLLOW UP: Our staff will call the number listed on your records the next business day following your procedure.  We will call around 7:15- 8:00 am to check on you and address any questions or concerns that you may have regarding the information given to you following your procedure. If we do not reach you, we will leave a message.     If any biopsies were taken you will be contacted by phone or by letter within the next 1-3 weeks.  Please call us at [phone] if you have not heard about the biopsies in 3 weeks.    SIGNATURES/CONFIDENTIALITY: You and/or your care partner have signed paperwork which will be entered into your electronic medical record.  These signatures attest to the fact that that the information above on your After Visit Summary has been reviewed and is understood.  Full responsibility of the confidentiality of this discharge information lies with you and/or your care-partner. 

## 2022-02-20 NOTE — Progress Notes (Signed)
Called to room to assist during endoscopic procedure.  Patient ID and intended procedure confirmed with present staff. Received instructions for my participation in the procedure from the performing physician.  

## 2022-02-21 ENCOUNTER — Telehealth: Payer: Self-pay

## 2022-02-21 NOTE — Telephone Encounter (Signed)
Attempted f/u call. No answer, left VM. 

## 2022-02-26 ENCOUNTER — Encounter: Payer: Self-pay | Admitting: Gastroenterology

## 2022-03-04 ENCOUNTER — Other Ambulatory Visit: Payer: Self-pay | Admitting: Internal Medicine

## 2022-03-04 DIAGNOSIS — Z1231 Encounter for screening mammogram for malignant neoplasm of breast: Secondary | ICD-10-CM

## 2022-04-01 ENCOUNTER — Ambulatory Visit: Payer: BC Managed Care – PPO | Admitting: Internal Medicine

## 2022-04-19 ENCOUNTER — Ambulatory Visit
Admission: RE | Admit: 2022-04-19 | Discharge: 2022-04-19 | Disposition: A | Discharge status: Home / Self Care | Payer: BC Managed Care – PPO | Source: Ambulatory Visit | Attending: Internal Medicine | Admitting: Internal Medicine

## 2022-04-19 VITALS — BP 150/107 | HR 80 | Temp 98.5°F | Resp 16

## 2022-04-19 DIAGNOSIS — J069 Acute upper respiratory infection, unspecified: Secondary | ICD-10-CM | POA: Insufficient documentation

## 2022-04-19 DIAGNOSIS — Z20822 Contact with and (suspected) exposure to covid-19: Secondary | ICD-10-CM | POA: Insufficient documentation

## 2022-04-19 MED ORDER — BENZONATATE 100 MG PO CAPS: 100.0000 mg | ORAL_CAPSULE | Freq: Three times a day (TID) | ORAL | 0 refills | Status: DC | PRN

## 2022-04-19 MED ORDER — FLUTICASONE PROPIONATE 50 MCG/ACT NA SUSP
1.0000 | Freq: Every day | NASAL | 0 refills | Status: DC
Start: 2022-04-19 — End: 2022-06-17

## 2022-04-19 MED ORDER — CETIRIZINE HCL 10 MG PO TABS: 10.0000 mg | ORAL_TABLET | Freq: Every day | ORAL | 0 refills | Status: DC

## 2022-04-19 NOTE — ED Provider Notes (Signed)
[facility]    CSN: [id number] Arrival date & time: 04/19/22  0940      History   Chief Complaint Chief Complaint  Patient presents with   Cough    HPI Gabriella Riley is a 47 y.o. female.   Patient presents with cough, ear pain, sore throat, nasal congestion, sweats, chills that started about 2 to 3 days ago.  Patient reports that she was exposed to COVID-19 at her workplace.  Although, she took a COVID test at home that was negative.  Denies chest pain, shortness of breath, nausea, vomiting, diarrhea, abdominal pain.  Has taken DayQuil and NyQuil with minimal improvement in symptoms.  Reports history of asthma but has not had to use albuterol inhaler since being sick.  Patient does not smoke cigarettes.   Cough   Past Medical History:  Diagnosis Date   Anemia    Angio-edema    Arthritis    Back pain    Bilateral swelling of feet and ankles    Constipation    Food allergy    GERD (gastroesophageal reflux disease)    History of blood clots    Hyperlipidemia    Hypertension    Joint pain    Lactose intolerance    Other fatigue    Shortness of breath    Shortness of breath on exertion    Urticaria    Vitamin D deficiency     Patient Active Problem List   Diagnosis Date Noted   Prediabetes 12/19/2020   HLD (hyperlipidemia) 12/19/2020   Class 3 severe obesity due to excess calories with body mass index (BMI) of 40.0 to 44.9 in adult ([place]) 12/19/2020   Essential hypertension 10/04/2019   Arthritis of both knees 01/02/2015    Past Surgical History:  Procedure Laterality Date   BREAST BIOPSY Left 2007   BREAST EXCISIONAL BIOPSY     TUBAL LIGATION      OB History     Gravida  4   Para  4   Term  4   Preterm  0   AB  0   Living  4      SAB  0   IAB  0   Ectopic  0   Multiple  0   Live Births  4            Home Medications    Prior to Admission medications   Medication Sig Start Date End Date Taking? Authorizing  Provider  benzonatate (TESSALON) 100 MG capsule Take 1 capsule (100 mg total) by mouth every 8 (eight) hours as needed for cough. 04/19/22  Yes Caitlyn Buchanan, Michele Rockers, FNP  cetirizine (ZYRTEC) 10 MG tablet Take 1 tablet (10 mg total) by mouth daily. 04/19/22  Yes Mahir Prabhakar, Hildred Alamin E, FNP  fluticasone (FLONASE) 50 MCG/ACT nasal spray Place 1 spray into both nostrils daily. 04/19/22  Yes Wynter Isaacs, Hildred Alamin E, FNP  acetaminophen (TYLENOL) 500 MG tablet Take 500-1,000 mg by mouth every 6 (six) hours as needed (for pain.).    [provider]  albuterol (VENTOLIN HFA) 108 (90 Base) MCG/ACT inhaler Inhale 2 puffs into the lungs every 6 (six) hours as needed for wheezing or shortness of breath. 12/30/21   Jearld Fenton, NP  chlorthalidone (HYGROTON) 25 MG tablet Take 1 tablet (25 mg total) by mouth daily. 09/03/21   Dennard Nip D, MD  famotidine (PEPCID) 20 MG tablet Take 1 tablet (20 mg total) by mouth 2 (two) times daily. 02/18/22  Larose Kells, MD  Multiple Vitamins-Minerals (ADULT GUMMY PO) Take 2 tablets by mouth daily.    [provider]  Olopatadine HCl 0.2 % SOLN Apply 1 drop to eye daily as needed. 02/18/22   Larose Kells, MD  Vitamin D, Ergocalciferol, (DRISDOL) 1.25 MG (50000 UNIT) CAPS capsule Take 1 capsule (50,000 Units total) by mouth every 7 (seven) days. 09/03/21   Starlyn Skeans, MD    Family History Family History  Problem Relation Age of Onset   Allergic rhinitis Mother    Healthy Mother    Obesity Father    Sleep apnea Father    Heart disease Father    Diabetes Father    Hyperlipidemia Father    Hypertension Father    Heart failure Father    Healthy Sister    Prostate cancer Brother    Heart attack Brother    Arthritis Maternal Grandmother    Heart disease Other    Sleep apnea Other    Obesity Other    Colon cancer Neg Hx    Colon polyps Neg Hx    Esophageal cancer Neg Hx    Rectal cancer Neg Hx    Stomach cancer Neg Hx     Social History Social History    Tobacco Use   Smoking status: Never    Passive exposure: Never   Smokeless tobacco: Never  Vaping Use   Vaping Use: Never used  Substance Use Topics   Alcohol use: Yes    Alcohol/week: 0.0 standard drinks of alcohol    Comment: social   Drug use: No     Allergies   Shellfish allergy and Latex   Review of Systems Review of Systems Per HPI  Physical Exam Triage Vital Signs ED Triage Vitals [04/19/22 1011]  Enc Vitals Group     BP (!) 150/107     Pulse Rate 80     Resp 16     Temp 98.5 F (36.9 C)     Temp Source Oral     SpO2 97 %     Weight      Height      Head Circumference      Peak Flow      Pain Score 8     Pain Loc      Pain Edu?      Excl. in [place]?    No data found.  Updated Vital Signs BP (!) 150/107 (BP Location: Left Arm)   Pulse 80   Temp 98.5 F (36.9 C) (Oral)   Resp 16   SpO2 97%   Visual Acuity Right Eye Distance:   Left Eye Distance:   Bilateral Distance:    Right Eye Near:   Left Eye Near:    Bilateral Near:     Physical Exam Constitutional:      General: She is not in acute distress.    Appearance: Normal appearance. She is not toxic-appearing or diaphoretic.  HENT:     Head: Normocephalic and atraumatic.     Right Ear: Tympanic membrane and ear canal normal.     Left Ear: Tympanic membrane and ear canal normal.     Nose: Congestion present.     Mouth/Throat:     Mouth: Mucous membranes are moist.     Pharynx: No posterior oropharyngeal erythema.  Eyes:     Extraocular Movements: Extraocular movements intact.     Conjunctiva/sclera: Conjunctivae normal.     Pupils: Pupils are equal, round, and  reactive to light.  Cardiovascular:     Rate and Rhythm: Normal rate and regular rhythm.     Pulses: Normal pulses.     Heart sounds: Normal heart sounds.  Pulmonary:     Effort: Pulmonary effort is normal. No respiratory distress.     Breath sounds: Normal breath sounds. No stridor. No wheezing, rhonchi or rales.  Abdominal:      General: Abdomen is flat. Bowel sounds are normal.     Palpations: Abdomen is soft.  Musculoskeletal:        General: Normal range of motion.     Cervical back: Normal range of motion.  Skin:    General: Skin is warm and dry.  Neurological:     General: No focal deficit present.     Mental Status: She is alert and oriented to person, place, and time. Mental status is at baseline.  Psychiatric:        Mood and Affect: Mood normal.        Behavior: Behavior normal.      UC Treatments / Results  Labs (all labs ordered are listed, but only abnormal results are displayed) Labs Reviewed  SARS CORONAVIRUS 2 (TAT 6-24 HRS)    EKG   Radiology No results found.  Procedures Procedures (including critical care time)  Medications Ordered in UC Medications - No data to display  Initial Impression / Assessment and Plan / UC Course  I have reviewed the triage vital signs and the nursing notes.  Pertinent labs & imaging results that were available during my care of the patient were reviewed by me and considered in my medical decision making (see chart for details).     Patient presents with symptoms likely from a viral upper respiratory infection.  Highly suspicious for COVID-19 given patient exposure.  Do not suspect underlying cardiopulmonary process. Symptoms seem unlikely related to ACS, CHF or COPD exacerbations, pneumonia, pneumothorax. Patient is nontoxic appearing and not in need of emergent medical intervention.  COVID test pending.  Flu testing deferred given limited resources here in urgent care for testing.  Rapid strep testing deferred given appearance of posterior pharynx on exam and do not think it is necessary.  Recommended symptom control with medications and supportive care.  Patient sent prescriptions.  She has mildly elevated blood pressure with recheck being similar.  She denies history of hypertension and does not take medication for it.  She is asymptomatic  regarding blood pressure so do not think emergent evaluation is necessary.  Patient to monitor blood pressure at home and follow-up if it remains elevated.  Return if symptoms fail to improve in 1-2 weeks or you develop shortness of breath, chest pain, severe headache. Patient states understanding and is agreeable.  Discharged with PCP followup.  Final Clinical Impressions(s) / UC Diagnoses   Final diagnoses:  Viral upper respiratory tract infection with cough  Close exposure to COVID-19 virus     Discharge Instructions      It appears that you have a viral upper respiratory infection which should run its course and self resolve with symptomatic treatment as we discussed.  I have prescribed you 3 different medications including a nasal spray to help alleviate fluid in ears and symptoms.  COVID test is pending.  We will call if it is positive.  Please follow-up if symptoms persist or worsen.    ED Prescriptions     Medication Sig Dispense Auth. Provider   fluticasone (FLONASE) 50 MCG/ACT nasal spray Place  1 spray into both nostrils daily. 16 g Oswaldo Conroy E, [place]   cetirizine (ZYRTEC) 10 MG tablet Take 1 tablet (10 mg total) by mouth daily. 30 tablet [place], [place] E, [place]   benzonatate (TESSALON) 100 MG capsule Take 1 capsule (100 mg total) by mouth every 8 (eight) hours as needed for cough. 21 capsule [place], Michele Rockers, [place]      PDMP not reviewed this encounter.   Teodora Medici, [place] 04/19/22 1126

## 2022-04-19 NOTE — Discharge Instructions (Signed)
It appears that you have a viral upper respiratory infection which should run its course and self resolve with symptomatic treatment as we discussed.  I have prescribed you 3 different medications including a nasal spray to help alleviate fluid in ears and symptoms.  COVID test is pending.  We will call if it is positive.  Please follow-up if symptoms persist or worsen.

## 2022-04-19 NOTE — ED Triage Notes (Signed)
Pt c/o sore throat, sweating, eyes watering, cough, nasal congestion, ear aches,   Onset ~ Thursday

## 2022-04-20 LAB — SARS CORONAVIRUS 2 (TAT 6-24 HRS): SARS Coronavirus 2: POSITIVE — AB

## 2022-04-30 ENCOUNTER — Ambulatory Visit
Admission: RE | Admit: 2022-04-30 | Discharge: 2022-04-30 | Disposition: A | Discharge status: Home / Self Care | Payer: BC Managed Care – PPO | Source: Ambulatory Visit | Attending: Internal Medicine | Admitting: Internal Medicine

## 2022-04-30 DIAGNOSIS — Z1231 Encounter for screening mammogram for malignant neoplasm of breast: Secondary | ICD-10-CM

## 2022-05-02 ENCOUNTER — Other Ambulatory Visit: Payer: Self-pay | Admitting: Internal Medicine

## 2022-05-02 DIAGNOSIS — R928 Other abnormal and inconclusive findings on diagnostic imaging of breast: Secondary | ICD-10-CM

## 2022-05-06 ENCOUNTER — Ambulatory Visit
Admission: RE | Admit: 2022-05-06 | Discharge: 2022-05-06 | Disposition: A | Discharge status: Home / Self Care | Payer: BC Managed Care – PPO | Source: Ambulatory Visit | Attending: Internal Medicine | Admitting: Internal Medicine

## 2022-05-06 DIAGNOSIS — R928 Other abnormal and inconclusive findings on diagnostic imaging of breast: Secondary | ICD-10-CM

## 2022-05-19 ENCOUNTER — Ambulatory Visit: Payer: BC Managed Care – PPO | Admitting: Podiatry

## 2022-05-26 ENCOUNTER — Ambulatory Visit: Payer: BC Managed Care – PPO | Admitting: Podiatry

## 2022-06-16 ENCOUNTER — Ambulatory Visit: Payer: BC Managed Care – PPO | Admitting: Podiatry

## 2022-06-16 ENCOUNTER — Ambulatory Visit (INDEPENDENT_AMBULATORY_CARE_PROVIDER_SITE_OTHER): Payer: BC Managed Care – PPO

## 2022-06-16 DIAGNOSIS — S92345A Nondisplaced fracture of fourth metatarsal bone, left foot, initial encounter for closed fracture: Secondary | ICD-10-CM | POA: Diagnosis not present

## 2022-06-16 DIAGNOSIS — M778 Other enthesopathies, not elsewhere classified: Secondary | ICD-10-CM

## 2022-06-16 DIAGNOSIS — R52 Pain, unspecified: Secondary | ICD-10-CM

## 2022-06-16 DIAGNOSIS — M21619 Bunion of unspecified foot: Secondary | ICD-10-CM

## 2022-06-16 MED ORDER — MELOXICAM 15 MG PO TABS: 15.0000 mg | ORAL_TABLET | Freq: Every day | ORAL | 0 refills | Status: DC | PRN
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
## 2022-06-17 ENCOUNTER — Ambulatory Visit: Payer: BC Managed Care – PPO | Admitting: Internal Medicine

## 2022-06-17 ENCOUNTER — Other Ambulatory Visit: Payer: Self-pay

## 2022-06-17 ENCOUNTER — Encounter: Payer: Self-pay | Admitting: Internal Medicine

## 2022-06-17 VITALS — BP 142/100 | HR 70 | Temp 98.3°F | Resp 20 | Wt 275.9 lb

## 2022-06-17 DIAGNOSIS — J302 Other seasonal allergic rhinitis: Secondary | ICD-10-CM | POA: Diagnosis not present

## 2022-06-17 DIAGNOSIS — L501 Idiopathic urticaria: Secondary | ICD-10-CM | POA: Diagnosis not present

## 2022-06-17 DIAGNOSIS — J3089 Other allergic rhinitis: Secondary | ICD-10-CM | POA: Diagnosis not present

## 2022-06-17 MED ORDER — FAMOTIDINE 20 MG PO TABS: 20.0000 mg | ORAL_TABLET | Freq: Two times a day (BID) | ORAL | 5 refills | Status: AC | PRN

## 2022-06-17 MED ORDER — FEXOFENADINE HCL 180 MG PO TABS: 180.0000 mg | ORAL_TABLET | Freq: Two times a day (BID) | ORAL | 5 refills | Status: AC | PRN

## 2022-06-17 MED ORDER — AZELASTINE HCL 0.1 % NA SOLN: 1.0000 | Freq: Two times a day (BID) | NASAL | 5 refills | Status: DC | PRN

## 2022-06-17 MED ORDER — OLOPATADINE HCL 0.2 % OP SOLN: 1.0000 [drp] | Freq: Every day | OPHTHALMIC | 5 refills | Status: DC | PRN

## 2022-06-17 MED ORDER — FLUTICASONE PROPIONATE 50 MCG/ACT NA SUSP: 2.0000 | Freq: Every day | NASAL | 5 refills | Status: AC

## 2022-06-17 NOTE — Progress Notes (Signed)
FOLLOW UP Date of Service/Encounter:  06/17/22   Subjective:  Gabriella Riley (DOB: [birth year]) is a 47 y.o. female who returns to the [facility] on 06/17/2022 for follow up for idiopathic urticaria/angioedema.   History obtained from: chart review and patient. Last visit was with me on February 18, 2022 for lip swelling and hives responsive to Benadryl.  Also with rhinoconjunctivitis.  Skin prick testing at that time was positive for trees ragweed and dust mite.  Started on Allegra/Pepcid and Flonase/olopatadine as needed.  Hives/Angioedema: Since last visit, she has been taking Allegra 180 mg daily.  Denies any issues with lip swelling.  Still does have hives about 2 times a week that are short lasting especially if she takes the medications.  She has not started the Pepcid that or increase the dosing of the Allegra.  She does wonder if there are any other causes for these hives and swelling.  Rhinoconjunctivitis: Reports her allergies are not doing too bad.  Does intermittently have runny nose and congestion during spring time she is using her Flonase daily but has not required the eyedrops.  Also on Allegra once a day.  Past Medical History: Past Medical History:  Diagnosis Date   Anemia    Angio-edema    Arthritis    Back pain    Bilateral swelling of feet and ankles    Constipation    Food allergy    GERD (gastroesophageal reflux disease)    History of blood clots    Hyperlipidemia    Hypertension    Joint pain    Lactose intolerance    Other fatigue    Shortness of breath    Shortness of breath on exertion    Urticaria    Vitamin D deficiency     Objective:  BP (!) 142/100 (BP Location: Left Arm, Cuff Size: Large)   Pulse 70   Temp 98.3 F (36.8 C) (Temporal)   Resp 20   Wt 275 lb 14.4 oz (125.1 kg)   SpO2 98%   BMI 43.21 kg/m  Body mass index is 43.21 kg/m. Physical Exam: GEN: alert, well developed HEENT: clear conjunctiva, TM grey and  translucent, nose with moderate inferior turbinate hypertrophy, pink nasal mucosa, clear rhinorrhea, no cobblestoning HEART: regular rate and rhythm, no murmur LUNGS: clear to auscultation bilaterally, no coughing, unlabored respiration SKIN: no rashes or lesions  Assessment:   1. Idiopathic urticaria   2. Seasonal and perennial allergic rhinitis     Plan/Recommendations:  Idiopathic Urticaria/Angioedema (Hives/swelling): -Uncontrolled, will obtain labs to identify any triggers.  Also discussed escalation of antihistamines as listed below. - At this time etiology of hives and swelling is unknown. Hives can be caused by a variety of different triggers including illness/infection, exercise, pressure, vibrations, extremes of temperature to name a few however majority of the time there is no identifiable trigger.  -We will obtain labs to rule out inflammatory/autoimmune/alpha gal/mast cell causes. -Continue Allegra 180mg  daily.   -If hives are still occurring in about 1 week, increase to Allegra 180mg  twice daily.   -If hives are still occurring in about 1 week, add Pepcid 20mg  twice daily and continue Allegra 180mg  twice daily. -If still no improvement, increase to Allegra 360mg  twice daily and Pepcid 40mg  twice daily.  Allergic Rhinitis: -Not well-controlled especially during springtime despite use of INCS.  As we are going into the Spring season discussed addition of INAH as needed. - Positive skin test 01/2022: trees, ragweed,  dust mite - Avoidance measures discussed. - Use nasal saline rinses before nose sprays such as with Neilmed Sinus Rinse bottle.  Use distilled water.   - Use Flonase 2 sprays each nostril daily. Aim upward and outward. - Use Azelastine 1-2 sprays each nostril daily as needed for runny nose, congestion, drainage, sneezing. Aim upward and outward. - Use Allegra 180mg  daily.  - For eyes, use Olopatadine or Ketotifen 1 eye drops daily as needed.  Available over the  counter, if not covered by insurance.  - Consider allergy shots as long term control of your symptoms by teaching your immune system to be more tolerant of your allergy triggers    Return in about 3 months (around 09/17/2022).  Harlon Flor, MD [facility] [place]

## 2022-06-17 NOTE — Patient Instructions (Addendum)
Idiopathic Urticaria/Angioedema (Hives/swelling): - At this time etiology of hives and swelling is unknown. Hives can be caused by a variety of different triggers including illness/infection, exercise, pressure, vibrations, extremes of temperature to name a few however majority of the time there is no identifiable trigger.  -We will obtain labs to rule out inflammatory/autoimmune/alpha gal/mast cell causes. -Continue Allegra 180mg  daily.   -If hives are still occurring in about 1 week, increase to Allegra 180mg  twice daily.   -If hives are still occurring in about 1 week, add Pepcid 20mg  twice daily and continue Allegra 180mg  twice daily. -If still no improvement, increase to Allegra 360mg  twice daily and Pepcid 40mg  twice daily.  Allergic Rhinitis: - Positive skin test 01/2022: trees, ragweed, dust mite - Avoidance measures discussed. - Use nasal saline rinses before nose sprays such as with Neilmed Sinus Rinse bottle.  Use distilled water.   - Use Flonase 2 sprays each nostril daily. Aim upward and outward. - Use Azelastine 1-2 sprays each nostril daily as needed for runny nose, congestion, drainage, sneezing. Aim upward and outward. - Use Allegra 180mg  daily.  - For eyes, use Olopatadine or Ketotifen 1 eye drops daily as needed.  Available over the counter, if not covered by insurance.  - Consider allergy shots as long term control of your symptoms by teaching your immune system to be more tolerant of your allergy triggers

## 2022-06-24 LAB — TRYPTASE: Tryptase: 7.1 ug/L (ref 2.2–13.2)

## 2022-06-24 LAB — CMP14+EGFR
ALT: 10 IU/L (ref 0–32)
AST: 10 IU/L (ref 0–40)
Albumin/Globulin Ratio: 1.5 (ref 1.2–2.2)
Albumin: 3.8 g/dL — ABNORMAL LOW (ref 3.9–4.9)
Alkaline Phosphatase: 93 IU/L (ref 44–121)
BUN/Creatinine Ratio: 12 (ref 9–23)
BUN: 11 mg/dL (ref 6–24)
Bilirubin Total: 0.2 mg/dL (ref 0.0–1.2)
CO2: 23 mmol/L (ref 20–29)
Calcium: 9.1 mg/dL (ref 8.7–10.2)
Chloride: 106 mmol/L (ref 96–106)
Creatinine, Ser: 0.91 mg/dL (ref 0.57–1.00)
Globulin, Total: 2.6 g/dL (ref 1.5–4.5)
Glucose: 90 mg/dL (ref 70–99)
Potassium: 4 mmol/L (ref 3.5–5.2)
Sodium: 143 mmol/L (ref 134–144)
Total Protein: 6.4 g/dL (ref 6.0–8.5)
eGFR: 79 mL/min/{1.73_m2} (ref >59)

## 2022-06-24 LAB — ALPHA-GAL PANEL
Allergen Lamb IgE: <0.1 kU/L
Beef IgE: <0.1 kU/L
IgE (Immunoglobulin E), Serum: 316 IU/mL (ref 6–495)
O215-IgE Alpha-Gal: <0.1 kU/L
Pork IgE: <0.1 kU/L

## 2022-06-24 LAB — TSH+FREE T4
Free T4: 1.21 ng/dL (ref 0.82–1.77)
TSH: 1 u[IU]/mL (ref 0.450–4.500)

## 2022-06-24 LAB — CBC WITH DIFFERENTIAL/PLATELET
Basophils Absolute: 0 10*3/uL (ref 0.0–0.2)
Basos: 1 %
EOS (ABSOLUTE): 0.2 10*3/uL (ref 0.0–0.4)
Eos: 3 %
Hematocrit: 42.3 % (ref 34.0–46.6)
Hemoglobin: 12.8 g/dL (ref 11.1–15.9)
Immature Grans (Abs): 0 10*3/uL (ref 0.0–0.1)
Immature Granulocytes: 0 %
Lymphocytes Absolute: 2 10*3/uL (ref 0.7–3.1)
Lymphs: 23 %
MCH: 23.2 pg — ABNORMAL LOW (ref 26.6–33.0)
MCHC: 30.3 g/dL — ABNORMAL LOW (ref 31.5–35.7)
MCV: 77 fL — ABNORMAL LOW (ref 79–97)
Monocytes Absolute: 0.5 10*3/uL (ref 0.1–0.9)
Monocytes: 6 %
Neutrophils Absolute: 6.1 10*3/uL (ref 1.4–7.0)
Neutrophils: 67 %
Platelets: 286 10*3/uL (ref 150–450)
RBC: 5.51 x10E6/uL — ABNORMAL HIGH (ref 3.77–5.28)
RDW: 14.4 % (ref 11.7–15.4)
WBC: 8.8 10*3/uL (ref 3.4–10.8)

## 2022-06-24 LAB — ANA W/REFLEX: Anti Nuclear Antibody (ANA): NEGATIVE

## 2022-06-24 LAB — C3 AND C4
Complement C3, Serum: 165 mg/dL (ref 82–167)
Complement C4, Serum: 43 mg/dL — ABNORMAL HIGH (ref 12–38)

## 2022-06-24 LAB — CHRONIC URTICARIA: cu index: 11.7 — ABNORMAL HIGH (ref <10)

## 2022-06-30 ENCOUNTER — Encounter: Payer: Self-pay | Admitting: Obstetrics & Gynecology

## 2022-06-30 ENCOUNTER — Ambulatory Visit (INDEPENDENT_AMBULATORY_CARE_PROVIDER_SITE_OTHER): Payer: BC Managed Care – PPO | Admitting: Obstetrics & Gynecology

## 2022-06-30 ENCOUNTER — Other Ambulatory Visit (HOSPITAL_COMMUNITY)
Admission: RE | Admit: 2022-06-30 | Discharge: 2022-06-30 | Disposition: A | Discharge status: Home / Self Care | Payer: BC Managed Care – PPO | Source: Ambulatory Visit | Attending: Obstetrics & Gynecology | Admitting: Obstetrics & Gynecology

## 2022-06-30 VITALS — BP 151/90 | HR 91 | Ht 67.0 in | Wt 276.0 lb

## 2022-06-30 DIAGNOSIS — Z113 Encounter for screening for infections with a predominantly sexual mode of transmission: Secondary | ICD-10-CM | POA: Diagnosis present

## 2022-06-30 DIAGNOSIS — Z01419 Encounter for gynecological examination (general) (routine) without abnormal findings: Secondary | ICD-10-CM | POA: Diagnosis present

## 2022-06-30 NOTE — Progress Notes (Signed)
GYNECOLOGY ANNUAL PREVENTATIVE CARE ENCOUNTER NOTE  History:     Gabriella Riley is a 47 y.o. [phone] female here for a routine annual gynecologic exam.  Current complaints: no GYN concerns.  Desires annual STI screen.  Denies abnormal vaginal bleeding, discharge, pelvic pain, problems with intercourse or other gynecologic concerns.    Gynecologic History No LMP recorded. (Menstrual status: IUD). Contraception: Mirena IUD Last Pap: 05/28/2021. Result was normal with negative HPV Last Mammogram: 04/30/2022.  Result was normal Last Colonoscopy: 02/20/2022.  Result was benign  Obstetric History OB History  Gravida Para Term Preterm AB Living  4 4 4  0 0 4  SAB IAB Ectopic Multiple Live Births  0 0 0 0 4    # Outcome Date GA Lbr Len/2nd Weight Sex Delivery Anes PTL Lv  4 Term 08/22/10 [redacted]w[redacted]d  7 lb 1 oz (3.204 kg) F Vag-Spont   LIV     Complications: Pre-eclampsia  3 Term 05/14/09 [redacted]w[redacted]d  8 lb 3 oz (3.714 kg) M Vag-Spont   LIV     Complications: Pre-eclampsia  2 Term 06/26/00 [redacted]w[redacted]d  6 lb 11 oz (3.033 kg) M Vag-Spont   LIV  1 Term 07/26/95 [redacted]w[redacted]d  7 lb 9 oz (3.43 kg) F Vag-Spont   LIV    Past Medical History:  Diagnosis Date   Anemia    Angio-edema    Arthritis    Back pain    Bilateral swelling of feet and ankles    Constipation    Food allergy    GERD (gastroesophageal reflux disease)    History of blood clots    Hyperlipidemia    Hypertension    Joint pain    Lactose intolerance    Other fatigue    Shortness of breath    Shortness of breath on exertion    Urticaria    Vitamin D deficiency     Past Surgical History:  Procedure Laterality Date   BREAST BIOPSY Left 2007   BREAST EXCISIONAL BIOPSY     TUBAL LIGATION      Current Outpatient Medications on File Prior to Visit  Medication Sig Dispense Refill   acetaminophen (TYLENOL) 500 MG tablet Take 500-1,000 mg by mouth every 6 (six) hours as needed (for pain.).     albuterol (VENTOLIN HFA) 108 (90 Base) MCG/ACT  inhaler Inhale 2 puffs into the lungs every 6 (six) hours as needed for wheezing or shortness of breath. 8 g 0   azelastine (ASTELIN) 0.1 % nasal spray Place 1 spray into both nostrils 2 (two) times daily as needed for rhinitis or allergies. Use in each nostril as directed 30 mL 5   cetirizine (ZYRTEC) 10 MG tablet Take 1 tablet (10 mg total) by mouth daily. 30 tablet 0   famotidine (PEPCID) 20 MG tablet Take 1 tablet (20 mg total) by mouth 2 (two) times daily as needed for heartburn or indigestion. 60 tablet 5   fexofenadine (ALLEGRA ALLERGY) 180 MG tablet Take 1 tablet (180 mg total) by mouth 2 (two) times daily as needed. 60 tablet 5   fluticasone (FLONASE) 50 MCG/ACT nasal spray Place 2 sprays into both nostrils daily. 16 g 5   meloxicam (MOBIC) 15 MG tablet Take 1 tablet (15 mg total) by mouth daily as needed for pain. 30 tablet 0   Multiple Vitamins-Minerals (ADULT GUMMY PO) Take 2 tablets by mouth daily.     Olopatadine HCl 0.2 % SOLN Apply 1 drop to eye daily as  needed. 2.5 mL 5   Vitamin D, Ergocalciferol, (DRISDOL) 1.25 MG (50000 UNIT) CAPS capsule Take 1 capsule (50,000 Units total) by mouth every 7 (seven) days. 4 capsule 0   benzonatate (TESSALON) 100 MG capsule Take 1 capsule (100 mg total) by mouth every 8 (eight) hours as needed for cough. (Patient not taking: Reported on 06/17/2022) 21 capsule 0   chlorthalidone (HYGROTON) 25 MG tablet Take 1 tablet (25 mg total) by mouth daily. (Patient not taking: Reported on 06/17/2022) 30 tablet 0   No current facility-administered medications on file prior to visit.    Allergies  Allergen Reactions   Shellfish Allergy    Latex Rash    Social History:  reports that she has never smoked. She has never been exposed to tobacco smoke. She has never used smokeless tobacco. She reports current alcohol use. She reports that she does not use drugs.  Family History  Problem Relation Age of Onset   Allergic rhinitis Mother    Healthy Mother     Obesity Father    Sleep apnea Father    Heart disease Father    Diabetes Father    Hyperlipidemia Father    Hypertension Father    Heart failure Father    Healthy Sister    Prostate cancer Brother    Heart attack Brother    Arthritis Maternal Grandmother    Heart disease Other    Sleep apnea Other    Obesity Other    Colon cancer Neg Hx    Colon polyps Neg Hx    Esophageal cancer Neg Hx    Rectal cancer Neg Hx    Stomach cancer Neg Hx     The following portions of the patient's history were reviewed and updated as appropriate: allergies, current medications, past family history, past medical history, past social history, past surgical history and problem list.  Review of Systems Pertinent items noted in HPI and remainder of comprehensive ROS otherwise negative.  Physical Exam:  BP (!) 151/90   Pulse 91   Ht 5\' 7"  (1.702 m)   Wt 276 lb (125.2 kg)   BMI 43.23 kg/m  CONSTITUTIONAL: Well-developed, well-nourished female in no acute distress.  HENT:  Normocephalic, atraumatic, External right and left ear normal.  EYES: Conjunctivae and EOM are normal. Pupils are equal, round, and reactive to light. No scleral icterus.  NECK: Normal range of motion, supple, no masses.  Normal thyroid.  SKIN: Skin is warm and dry. No rash noted. Not diaphoretic. No erythema. No pallor. MUSCULOSKELETAL: Normal range of motion. No tenderness.  No cyanosis, clubbing, or edema. NEUROLOGIC: Alert and oriented to person, place, and time. Normal reflexes, muscle tone coordination.  PSYCHIATRIC: Normal mood and affect. Normal behavior. Normal judgment and thought content. CARDIOVASCULAR: Normal heart rate noted, regular rhythm RESPIRATORY: Clear to auscultation bilaterally. Effort and breath sounds normal, no problems with respiration noted. BREASTS: Symmetric in size. No masses, tenderness, skin changes, nipple drainage, or lymphadenopathy bilaterally. Performed in the presence of a chaperone. ABDOMEN:  Soft, no distention noted.  No tenderness, rebound or guarding.  PELVIC: Normal appearing external genitalia and urethral meatus; normal appearing vaginal mucosa and cervix.  IUD strings seen.  No abnormal vaginal discharge noted.  Pap smear obtained.  Normal uterine size, no other palpable masses, no uterine or adnexal tenderness.  Performed in the presence of a chaperone.   Assessment and Plan:     1. Routine screening for STI (sexually transmitted infection) STI screen done, will  follow up results and manage accordingly. - Cytology - PAP - RPR+HBsAg+HCVAb+HIV  2. Well woman exam with routine gynecological exam - Cytology - PAP Will follow up results of pap smear and manage accordingly. Mammogram and colon cancer screening are up to date. Routine preventative health maintenance measures emphasized. Please refer to After Visit Summary for other counseling recommendations.      Jaynie CollinsUGONNA  Bayan Hedstrom, MD, FACOG Obstetrician & Gynecologist, [facility], [facility]

## 2022-07-01 LAB — RPR+HBSAG+HCVAB+...
HIV Screen 4th Generation wRfx: NONREACTIVE
Hep C Virus Ab: NONREACTIVE
Hepatitis B Surface Ag: NEGATIVE
RPR Ser Ql: REACTIVE — AB

## 2022-07-01 LAB — RPR, QUANT+TP ABS (REFLEX)
Rapid Plasma Reagin, Quant: 1:1 {titer} — ABNORMAL HIGH
T Pallidum Abs: REACTIVE — AB

## 2022-07-02 ENCOUNTER — Encounter: Payer: Self-pay | Admitting: Obstetrics & Gynecology

## 2022-07-02 ENCOUNTER — Telehealth: Payer: Self-pay | Admitting: *Deleted

## 2022-07-02 DIAGNOSIS — A539 Syphilis, unspecified: Secondary | ICD-10-CM

## 2022-07-02 HISTORY — DX: Syphilis, unspecified: A53.9

## 2022-07-02 NOTE — Telephone Encounter (Signed)
Called pt again and was able to get a hold of her. Discussed RPR results and recommendations. Will get pt set up at [facility] for PCN, will get that scheduled and reach back out to her in the morning.  Dr Shawnie Pons also helped answer patients questions.

## 2022-07-02 NOTE — Telephone Encounter (Signed)
Attempted to return pts call, left message to call back

## 2022-07-02 NOTE — Progress Notes (Signed)
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  Negative testing for other STIs, also needs to let partner(s) know so the partner(s) can get testing and treatment. Patient and sex partner(s) should abstain from unprotected sexual activity for at least seven days after everyone receives appropriate treatment.  She will need to be [facility] to [facility].  Please call to inform patient of results and recommendations, and advise to pick up prescription and take as directed.  Please advise patient to practice safe sex at all times.    Jaynie Collins, MD

## 2022-07-03 LAB — CYTOLOGY - PAP
Chlamydia: NEGATIVE
Comment: NEGATIVE
Comment: NEGATIVE
Comment: NEGATIVE
Comment: NORMAL
Diagnosis: NEGATIVE
High risk HPV: NEGATIVE
Neisseria Gonorrhea: NEGATIVE
Trichomonas: NEGATIVE

## 2022-07-08 ENCOUNTER — Ambulatory Visit (INDEPENDENT_AMBULATORY_CARE_PROVIDER_SITE_OTHER): Payer: BC Managed Care – PPO

## 2022-07-08 ENCOUNTER — Other Ambulatory Visit: Payer: Self-pay

## 2022-07-08 VITALS — BP 149/94 | HR 70 | Wt 279.3 lb

## 2022-07-08 DIAGNOSIS — I10 Essential (primary) hypertension: Secondary | ICD-10-CM

## 2022-07-08 DIAGNOSIS — A539 Syphilis, unspecified: Secondary | ICD-10-CM | POA: Diagnosis not present

## 2022-07-08 MED ORDER — CHLORTHALIDONE 25 MG PO TABS: 25.0000 mg | ORAL_TABLET | Freq: Every day | ORAL | 0 refills | Status: DC

## 2022-07-08 MED ORDER — PENICILLIN G BENZATHINE 1200000 UNIT/2ML IM SUSY
2.4000 10*6.[IU] | PREFILLED_SYRINGE | Freq: Once | INTRAMUSCULAR | Status: AC
  Administered 2022-07-08: 2.4 10*6.[IU] via INTRAMUSCULAR

## 2022-07-08 NOTE — Progress Notes (Signed)
Caryl Pina here for Bicillin Injection following positive RPR. Patient reports divorce approx 15 years prior due to an STD, which she believes was syphilis. No records available. Spoke with Elfredia Nevins with [facility] who states records have been requested from practice in [place]. Recommendation is to begin series of 3 weekly injections. If prior syphilis infection proven, pt may discontinue treatment. Our office will contact Elfredia Nevins 07/14/22 to inquire about records.  History of chronic htn. BP today 157/102 and 149/94. Also elevated at GYN annual exam 06/30/22. Not currently taking prescribed chlorthalidone. Denies side effects of medication. Not taking because she would prefer to make lifestyle changes and take less medication. Reviewed with Crissie Reese who recommends patient resume chlorthalidone and follow up with PCP as scheduled (08/01/22). Provider recommends BMP be drawn at that visit. Reviewed with patient who is agreeable to plan and will restart medication.  Marjo Bicker, RN 07/08/2022  10:28 AM

## 2022-07-09 ENCOUNTER — Other Ambulatory Visit: Payer: Self-pay | Admitting: *Deleted

## 2022-07-09 MED ORDER — FLUCONAZOLE 150 MG PO TABS: 150.0000 mg | ORAL_TABLET | Freq: Once | ORAL | 3 refills | Status: AC

## 2022-07-16 ENCOUNTER — Other Ambulatory Visit: Payer: Self-pay | Admitting: Podiatry

## 2022-07-16 ENCOUNTER — Ambulatory Visit (INDEPENDENT_AMBULATORY_CARE_PROVIDER_SITE_OTHER): Payer: BC Managed Care – PPO

## 2022-07-16 ENCOUNTER — Other Ambulatory Visit: Payer: Self-pay

## 2022-07-16 VITALS — BP 167/98 | HR 69 | Ht 67.0 in | Wt 272.5 lb

## 2022-07-16 DIAGNOSIS — I10 Essential (primary) hypertension: Secondary | ICD-10-CM

## 2022-07-16 DIAGNOSIS — A539 Syphilis, unspecified: Secondary | ICD-10-CM

## 2022-07-16 MED ORDER — PENICILLIN G BENZATHINE 1200000 UNIT/2ML IM SUSY
2.4000 10*6.[IU] | PREFILLED_SYRINGE | Freq: Once | INTRAMUSCULAR | Status: AC
  Administered 2022-07-16: 2.4 10*6.[IU] via INTRAMUSCULAR

## 2022-07-16 NOTE — Progress Notes (Signed)
Gabriella Riley here for  Bicillin   Injection.  Injection administered without complication. Patient will return in one week for next injection. Pt tolerated well. Pt states has not gotten records or call from [facility]. Will try and contact today for past hx of syphilis. Pt is scheduled for last BCN injection on 07/23/22.  BP elevated today in office. Pt states just started retaking BP meds last week from PCP on Thursday. Did take BP med this morning. Denies any s/s of HTN. Pt encouraged to call PCP to inform of elevated results today in office. Pt agreeable to plan of care.    [name], RN 07/16/2022  9:59 AM

## 2022-07-23 ENCOUNTER — Ambulatory Visit (INDEPENDENT_AMBULATORY_CARE_PROVIDER_SITE_OTHER): Payer: BC Managed Care – PPO

## 2022-07-23 ENCOUNTER — Other Ambulatory Visit: Payer: Self-pay

## 2022-07-23 VITALS — BP 138/86 | HR 72 | Ht 67.0 in | Wt 274.7 lb

## 2022-07-23 DIAGNOSIS — A539 Syphilis, unspecified: Secondary | ICD-10-CM

## 2022-07-23 MED ORDER — PENICILLIN G BENZATHINE 1200000 UNIT/2ML IM SUSY
2.4000 10*6.[IU] | PREFILLED_SYRINGE | Freq: Once | INTRAMUSCULAR | Status: AC
  Administered 2022-07-23: 2.4 10*6.[IU] via INTRAMUSCULAR

## 2022-07-23 NOTE — Progress Notes (Signed)
Pt here today for 3rd dose RPR treatment.  Bicillin 2.4 million units in L and R upper outer quadrant.  Pt tolerated well. Pt advised to continue with treatment plan as her provider has recommended.  Pt verbalized understanding with no further questions.   Leonette Nutting  07/23/22

## 2022-07-31 ENCOUNTER — Ambulatory Visit: Payer: BC Managed Care – PPO | Admitting: Podiatry

## 2022-08-01 ENCOUNTER — Ambulatory Visit: Payer: BC Managed Care – PPO | Admitting: Internal Medicine

## 2022-08-04 ENCOUNTER — Ambulatory Visit: Payer: BC Managed Care – PPO | Admitting: Internal Medicine

## 2022-08-11 ENCOUNTER — Ambulatory Visit: Payer: BC Managed Care – PPO | Admitting: Internal Medicine

## 2022-08-11 ENCOUNTER — Encounter: Payer: Self-pay | Admitting: Internal Medicine

## 2022-08-11 VITALS — BP 136/84 | HR 69 | Temp 96.6°F | Wt 281.0 lb

## 2022-08-11 DIAGNOSIS — R7303 Prediabetes: Secondary | ICD-10-CM | POA: Diagnosis not present

## 2022-08-11 DIAGNOSIS — I10 Essential (primary) hypertension: Secondary | ICD-10-CM | POA: Diagnosis not present

## 2022-08-11 DIAGNOSIS — E7800 Pure hypercholesterolemia, unspecified: Secondary | ICD-10-CM | POA: Diagnosis not present

## 2022-08-11 DIAGNOSIS — G4700 Insomnia, unspecified: Secondary | ICD-10-CM | POA: Insufficient documentation

## 2022-08-11 DIAGNOSIS — M170 Bilateral primary osteoarthritis of knee: Secondary | ICD-10-CM | POA: Diagnosis not present

## 2022-08-11 DIAGNOSIS — Z6841 Body Mass Index (BMI) 40.0 and over, adult: Secondary | ICD-10-CM

## 2022-08-11 DIAGNOSIS — F5101 Primary insomnia: Secondary | ICD-10-CM

## 2022-08-11 MED ORDER — TRAZODONE HCL 50 MG PO TABS: 25.0000 mg | ORAL_TABLET | Freq: Every evening | ORAL | 0 refills | Status: DC | PRN

## 2022-08-11 NOTE — Progress Notes (Signed)
Subjective:    Patient ID: Gabriella Riley, female    DOB: 27-Sep-1975, 47 y.o.   MRN: [id number]  HPI  Patient presents to clinic today for 45-month follow-up of chronic conditions.  HTN: Her BP today is 136/84.  She is taking Chlorthalidone as prescribed.  ECG from 08/2020 reviewed.  HLD: Her last LDL was 120, triglycerides 48, 01/2022.  She is not taking any cholesterol-lowering medication at this time.  She has been trying to consume a low-fat diet.  Prediabetes: Her last A1c was 5.7, 01/2022.  She is not taking any oral diabetic medications time.  She does not check her sugars.  OA: Mainly in her knees.  She takes Tylenol as needed with some relief of symptoms.  She is not currently following with orthopedics.  GERD: Triggered by tomato based foods, spicy foods.  She denies breakthrough on Famotidine.  There is no upper GI on file.  Insomnia: She has trouble falling asleep and staying asleep. She has failed Melatonin and a sleep aid OTC. There is no sleep study on file.   Review of Systems     Past Medical History:  Diagnosis Date   Anemia    Angio-edema    Arthritis    Back pain    Bilateral swelling of feet and ankles    Constipation    Food allergy    GERD (gastroesophageal reflux disease)    History of blood clots    Hyperlipidemia    Hypertension    Joint pain    Lactose intolerance    Other fatigue    Shortness of breath    Shortness of breath on exertion    Syphilis 07/02/2022   Urticaria    Vitamin D deficiency     Current Outpatient Medications  Medication Sig Dispense Refill   acetaminophen (TYLENOL) 500 MG tablet Take 500-1,000 mg by mouth every 6 (six) hours as needed (for pain.).     albuterol (VENTOLIN HFA) 108 (90 Base) MCG/ACT inhaler Inhale 2 puffs into the lungs every 6 (six) hours as needed for wheezing or shortness of breath. 8 g 0   azelastine (ASTELIN) 0.1 % nasal spray Place 1 spray into both nostrils 2 (two) times daily as needed for  rhinitis or allergies. Use in each nostril as directed 30 mL 5   cetirizine (ZYRTEC) 10 MG tablet Take 1 tablet (10 mg total) by mouth daily. 30 tablet 0   chlorthalidone (HYGROTON) 25 MG tablet Take 1 tablet (25 mg total) by mouth daily. 30 tablet 0   famotidine (PEPCID) 20 MG tablet Take 1 tablet (20 mg total) by mouth 2 (two) times daily as needed for heartburn or indigestion. 60 tablet 5   fexofenadine (ALLEGRA ALLERGY) 180 MG tablet Take 1 tablet (180 mg total) by mouth 2 (two) times daily as needed. 60 tablet 5   fluconazole (DIFLUCAN) 150 MG tablet Take 150 mg by mouth.     fluticasone (FLONASE) 50 MCG/ACT nasal spray Place 2 sprays into both nostrils daily. 16 g 5   Multiple Vitamins-Minerals (ADULT GUMMY PO) Take 2 tablets by mouth daily.     Olopatadine HCl 0.2 % SOLN Apply 1 drop to eye daily as needed. 2.5 mL 5   Vitamin D, Ergocalciferol, (DRISDOL) 1.25 MG (50000 UNIT) CAPS capsule Take 1 capsule (50,000 Units total) by mouth every 7 (seven) days. 4 capsule 0   No current facility-administered medications for this visit.    Allergies  Allergen Reactions  Shellfish Allergy    Latex Rash    Family History  Problem Relation Age of Onset   Allergic rhinitis Mother    Healthy Mother    Obesity Father    Sleep apnea Father    Heart disease Father    Diabetes Father    Hyperlipidemia Father    Hypertension Father    Heart failure Father    Healthy Sister    Prostate cancer Brother    Heart attack Brother    Arthritis Maternal Grandmother    Heart disease Other    Sleep apnea Other    Obesity Other    Colon cancer Neg Hx    Colon polyps Neg Hx    Esophageal cancer Neg Hx    Rectal cancer Neg Hx    Stomach cancer Neg Hx     Social History   Socioeconomic History   Marital status: Married    Spouse name: Will   Number of children: 4   Years of education: Not on file   Highest education level: Not on file  Occupational History   Occupation: Government social research officer    Employer: [employer]  Tobacco Use   Smoking status: Never    Passive exposure: Never   Smokeless tobacco: Never  Vaping Use   Vaping Use: Never used  Substance and Sexual Activity   Alcohol use: Yes    Alcohol/week: 0.0 standard drinks of alcohol    Comment: social   Drug use: No   Sexual activity: Yes    Birth control/protection: Surgical, I.U.D.    Comment: tubal ligation   Other Topics Concern   Not on file  Social History Narrative   Not on file   Social Determinants of Health   Financial Resource Strain: Not on file  Food Insecurity: No Food Insecurity (07/16/2022)   Hunger Vital Sign    Worried About Running Out of Food in the Last Year: Never true    Ran Out of Food in the Last Year: Never true  Transportation Needs: No Transportation Needs (07/16/2022)   PRAPARE - Administrator, Civil Service (Medical): No    Lack of Transportation (Non-Medical): No  Physical Activity: Not on file  Stress: Not on file  Social Connections: Not on file  Intimate Partner Violence: Not on file     Constitutional: Denies fever, malaise, fatigue, headache or abrupt weight changes.  HEENT: Denies eye pain, eye redness, ear pain, ringing in the ears, wax buildup, runny nose, nasal congestion, bloody nose, or sore throat. Respiratory: Denies difficulty breathing, shortness of breath, cough or sputum production.   Cardiovascular: Denies chest pain, chest tightness, palpitations or swelling in the hands or feet.  Gastrointestinal: Denies abdominal pain, bloating, constipation, diarrhea or blood in the stool.  GU: Denies urgency, frequency, pain with urination, burning sensation, blood in urine, odor or discharge. Musculoskeletal: Patient reports knee pain.  Denies decrease in range of motion, difficulty with gait, muscle pain or joint swelling.  Skin: Denies redness, rashes, lesions or ulcercations.  Neurological: Pt reports insomnia. Denies dizziness,  difficulty with memory, difficulty with speech or problems with balance and coordination.  Psych: Denies anxiety, depression, SI/HI.  No other specific complaints in a complete review of systems (except as listed in HPI above).  Objective:   Physical Exam  BP 136/84 (BP Location: Right Arm, Patient Position: Sitting, Cuff Size: Large)   Pulse 69   Temp (!) 96.6 F (35.9 C) (Temporal)  Wt 281 lb (127.5 kg)   LMP  (LMP Unknown)   SpO2 100%   BMI 44.01 kg/m   Wt Readings from Last 3 Encounters:  07/23/22 274 lb 11.2 oz (124.6 kg)  07/16/22 272 lb 8 oz (123.6 kg)  07/08/22 279 lb 4.8 oz (126.7 kg)    General: Appears her stated age, obese, in NAD. Skin: Warm, dry and intact.  HEENT: Head: normal shape and size; Eyes: sclera white, no icterus, conjunctiva pink, PERRLA and EOMs intact;  Cardiovascular: Normal rate and rhythm. S1,S2 noted.  No murmur, rubs or gallops noted. No JVD.  Trace BLE edema. Pulmonary/Chest: Normal effort and positive vesicular breath sounds. No respiratory distress. No wheezes, rales or ronchi noted.  Abdomen:  Normal bowel sounds.  Neurological: Alert and oriented. Coordination normal.      BMET    Component Value Date/Time   NA 143 06/17/2022 1113   K 4.0 06/17/2022 1113   CL 106 06/17/2022 1113   CO2 23 06/17/2022 1113   GLUCOSE 90 06/17/2022 1113   GLUCOSE 78 01/31/2022 0906   BUN 11 06/17/2022 1113   CREATININE 0.91 06/17/2022 1113   CREATININE 0.92 01/31/2022 0906   CALCIUM 9.1 06/17/2022 1113   GFRNONAA 80 09/28/2017 1005   GFRAA 92 09/28/2017 1005    Lipid Panel     Component Value Date/Time   CHOL 189 01/31/2022 0906   CHOL 186 09/20/2020 0844   TRIG 48 01/31/2022 0906   HDL 54 01/31/2022 0906   HDL 47 09/20/2020 0844   CHOLHDL 3.5 01/31/2022 0906   LDLCALC 120 (H) 01/31/2022 0906    CBC    Component Value Date/Time   WBC 8.8 06/17/2022 1113   WBC 10.0 01/31/2022 0906   RBC 5.51 (H) 06/17/2022 1113   RBC 5.31 (H)  01/31/2022 0906   HGB 12.8 06/17/2022 1113   HCT 42.3 06/17/2022 1113   PLT 286 06/17/2022 1113   MCV 77 (L) 06/17/2022 1113   MCH 23.2 (L) 06/17/2022 1113   MCH 23.0 (L) 01/31/2022 0906   MCHC 30.3 (L) 06/17/2022 1113   MCHC 31.2 (L) 01/31/2022 0906   RDW 14.4 06/17/2022 1113   LYMPHSABS 2.0 06/17/2022 1113   EOSABS 0.2 06/17/2022 1113   BASOSABS 0.0 06/17/2022 1113    Hgb A1C Lab Results  Component Value Date   HGBA1C 5.7 (H) 01/31/2022           Assessment & Plan:      RTC in 6 months for annual exam Nicki Reaper, NP

## 2022-08-11 NOTE — Assessment & Plan Note (Signed)
Encourage weight loss as this can produce joint pain Okay to continue Tylenol OTC as needed

## 2022-08-11 NOTE — Assessment & Plan Note (Signed)
Will trial Qsymia 

## 2022-08-11 NOTE — Assessment & Plan Note (Addendum)
Controlled on chlorthalidone Reinforced DASH diet and exercise for weight loss C-Met today

## 2022-08-11 NOTE — Assessment & Plan Note (Signed)
C-Met and lipid profile today Encouraged her to consume low-fat diet 

## 2022-08-11 NOTE — Assessment & Plan Note (Signed)
Will trial trazodone Consider sleep study if symptoms persist

## 2022-08-11 NOTE — Assessment & Plan Note (Signed)
A1c today °Encouraged low-carb diet and exercise for weight loss °

## 2022-08-11 NOTE — Patient Instructions (Signed)

## 2022-08-12 ENCOUNTER — Encounter: Payer: Self-pay | Admitting: Internal Medicine

## 2022-08-12 DIAGNOSIS — E7800 Pure hypercholesterolemia, unspecified: Secondary | ICD-10-CM

## 2022-08-12 LAB — LIPID PANEL
Cholesterol: 195 mg/dL (ref <200)
HDL: 52 mg/dL (ref >=50)
LDL Cholesterol (Calc): 127 mg/dL (calc) — ABNORMAL HIGH
Non-HDL Cholesterol (Calc): 143 mg/dL (calc) — ABNORMAL HIGH (ref <130)
Total CHOL/HDL Ratio: 3.8 (calc) (ref <5.0)
Triglycerides: 67 mg/dL (ref <150)

## 2022-08-12 LAB — HEMOGLOBIN A1C
Hgb A1c MFr Bld: 5.9 % of total Hgb — ABNORMAL HIGH (ref <5.7)
Mean Plasma Glucose: 123 mg/dL
eAG (mmol/L): 6.8 mmol/L

## 2022-08-12 LAB — CBC
HCT: 38.4 % (ref 35.0–45.0)
Hemoglobin: 11.8 g/dL (ref 11.7–15.5)
MCH: 23.2 pg — ABNORMAL LOW (ref 27.0–33.0)
MCHC: 30.7 g/dL — ABNORMAL LOW (ref 32.0–36.0)
MCV: 75.4 fL — ABNORMAL LOW (ref 80.0–100.0)
MPV: 12 fL (ref 7.5–12.5)
Platelets: 278 10*3/uL (ref 140–400)
RBC: 5.09 10*6/uL (ref 3.80–5.10)
RDW: 14.5 % (ref 11.0–15.0)
WBC: 11.6 10*3/uL — ABNORMAL HIGH (ref 3.8–10.8)

## 2022-08-12 LAB — COMPLETE METABOLIC PANEL WITH GFR
AG Ratio: 1.3 (calc) (ref 1.0–2.5)
ALT: 11 U/L (ref 6–29)
AST: 12 U/L (ref 10–35)
Albumin: 3.8 g/dL (ref 3.6–5.1)
Alkaline phosphatase (APISO): 82 U/L (ref 31–125)
BUN/Creatinine Ratio: 18 (calc) (ref 6–22)
BUN: 20 mg/dL (ref 7–25)
CO2: 27 mmol/L (ref 20–32)
Calcium: 9.5 mg/dL (ref 8.6–10.2)
Chloride: 105 mmol/L (ref 98–110)
Creat: 1.09 mg/dL — ABNORMAL HIGH (ref 0.50–0.99)
Globulin: 2.9 g/dL (calc) (ref 1.9–3.7)
Glucose, Bld: 79 mg/dL (ref 65–139)
Potassium: 4 mmol/L (ref 3.5–5.3)
Sodium: 140 mmol/L (ref 135–146)
Total Bilirubin: 0.3 mg/dL (ref 0.2–1.2)
Total Protein: 6.7 g/dL (ref 6.1–8.1)
eGFR: 64 mL/min/{1.73_m2} (ref >=60)

## 2022-08-12 MED ORDER — SIMVASTATIN 10 MG PO TABS: 10.0000 mg | ORAL_TABLET | Freq: Every day | ORAL | 1 refills | Status: DC

## 2022-08-14 ENCOUNTER — Ambulatory Visit: Payer: BC Managed Care – PPO | Admitting: Podiatry

## 2022-08-14 ENCOUNTER — Ambulatory Visit (INDEPENDENT_AMBULATORY_CARE_PROVIDER_SITE_OTHER): Payer: BC Managed Care – PPO

## 2022-08-14 DIAGNOSIS — S92345D Nondisplaced fracture of fourth metatarsal bone, left foot, subsequent encounter for fracture with routine healing: Secondary | ICD-10-CM | POA: Diagnosis not present

## 2022-08-14 DIAGNOSIS — M19072 Primary osteoarthritis, left ankle and foot: Secondary | ICD-10-CM | POA: Diagnosis not present

## 2022-08-14 DIAGNOSIS — M778 Other enthesopathies, not elsewhere classified: Secondary | ICD-10-CM | POA: Diagnosis not present

## 2022-08-14 MED ORDER — CELECOXIB 100 MG PO CAPS: 100.0000 mg | ORAL_CAPSULE | Freq: Two times a day (BID) | ORAL | 1 refills | Status: DC

## 2022-08-14 NOTE — Progress Notes (Signed)
Subjective: Chief Complaint  Patient presents with   Foot Pain    Pt stated that she is still having pain and swelling with her left foot     States that she is still having issues with the left foot after sustaining a fall on 02/21/2022.  She has continued pain and swelling particular after being on her feet a lot.  Objective: AAO x3, NAD DP/PT pulses palpable bilaterally, CRT less than 3 seconds Still discomfort noted on the dorsal aspect of left foot along the third, fourth metatarsal bases and there is localized edema.  Flexor, extensor tendons appear to be intact.  MMT 5/5. No pain with calf compression, swelling, warmth, erythema  Assessment: Concern for fourth metatarsal base fracture, arthritis  Plan: -All treatment options discussed with the patient including all alternatives, risks, complications.  -Repeat x-rays were obtained of the left foot.  3 views of the foot were obtained.  Joint space narrowing along the third metatarsal cuneiform joint. -Given her ongoing pain and swelling I have recommended MRI of the left foot which is ordered today.  Also given her pain recommended immobilization in a cam boot which is since today. -Celebrex -Patient encouraged to call the office with any questions, concerns, change in symptoms.   Vivi Barrack DPM

## 2022-08-14 NOTE — Patient Instructions (Signed)
I have ordered left foot MRI. If you do not hear for them about scheduling within the next 1 week, or you have any questions please give [place] a call at [phone].

## 2022-08-15 ENCOUNTER — Ambulatory Visit: Payer: BC Managed Care – PPO | Admitting: Podiatry

## 2022-08-21 ENCOUNTER — Ambulatory Visit: Payer: BC Managed Care – PPO | Admitting: Podiatry

## 2022-08-24 ENCOUNTER — Other Ambulatory Visit: Payer: BC Managed Care – PPO

## 2022-08-25 ENCOUNTER — Other Ambulatory Visit: Payer: Self-pay | Admitting: Family Medicine

## 2022-08-25 DIAGNOSIS — I10 Essential (primary) hypertension: Secondary | ICD-10-CM

## 2022-09-04 ENCOUNTER — Encounter: Payer: Self-pay | Admitting: Podiatry

## 2022-09-06 ENCOUNTER — Ambulatory Visit
Admission: RE | Admit: 2022-09-06 | Discharge: 2022-09-06 | Disposition: A | Discharge status: Home / Self Care | Payer: BC Managed Care – PPO | Source: Ambulatory Visit | Attending: Podiatry | Admitting: Podiatry

## 2022-09-06 DIAGNOSIS — M778 Other enthesopathies, not elsewhere classified: Secondary | ICD-10-CM

## 2022-09-08 ENCOUNTER — Encounter: Payer: Self-pay | Admitting: Internal Medicine

## 2022-09-08 DIAGNOSIS — I10 Essential (primary) hypertension: Secondary | ICD-10-CM

## 2022-09-16 ENCOUNTER — Encounter: Payer: Self-pay | Admitting: Podiatry

## 2022-09-25 ENCOUNTER — Other Ambulatory Visit: Payer: Self-pay | Admitting: Internal Medicine

## 2022-09-26 NOTE — Telephone Encounter (Signed)
Can we please schedule her for an injection and inserts? Thanks!

## 2022-09-27 MED ORDER — SIMVASTATIN 10 MG PO TABS: 10.0000 mg | ORAL_TABLET | Freq: Every day | ORAL | 1 refills | Status: DC

## 2022-09-27 MED ORDER — TRAZODONE HCL 50 MG PO TABS: 25.0000 mg | ORAL_TABLET | Freq: Every evening | ORAL | 0 refills | Status: DC | PRN

## 2022-09-27 MED ORDER — CHLORTHALIDONE 25 MG PO TABS: 25.0000 mg | ORAL_TABLET | Freq: Every day | ORAL | 1 refills | Status: DC

## 2022-09-30 ENCOUNTER — Ambulatory Visit: Payer: BC Managed Care – PPO | Admitting: Internal Medicine

## 2022-10-02 ENCOUNTER — Ambulatory Visit: Payer: BC Managed Care – PPO | Admitting: Podiatry

## 2022-12-02 ENCOUNTER — Ambulatory Visit: Payer: BC Managed Care – PPO | Admitting: Internal Medicine

## 2022-12-02 ENCOUNTER — Encounter: Payer: Self-pay | Admitting: Internal Medicine

## 2022-12-02 VITALS — BP 116/78 | HR 66 | Temp 96.0°F | Ht 67.0 in | Wt 280.0 lb

## 2022-12-02 DIAGNOSIS — R7303 Prediabetes: Secondary | ICD-10-CM

## 2022-12-02 DIAGNOSIS — E559 Vitamin D deficiency, unspecified: Secondary | ICD-10-CM

## 2022-12-02 DIAGNOSIS — E66813 Obesity, class 3: Secondary | ICD-10-CM

## 2022-12-02 DIAGNOSIS — Z6841 Body Mass Index (BMI) 40.0 and over, adult: Secondary | ICD-10-CM

## 2022-12-02 DIAGNOSIS — M170 Bilateral primary osteoarthritis of knee: Secondary | ICD-10-CM | POA: Diagnosis not present

## 2022-12-02 DIAGNOSIS — E7800 Pure hypercholesterolemia, unspecified: Secondary | ICD-10-CM

## 2022-12-02 DIAGNOSIS — I10 Essential (primary) hypertension: Secondary | ICD-10-CM

## 2022-12-02 MED ORDER — NALTREXONE-BUPROPION HCL ER 8-90 MG PO TB12: ORAL_TABLET | ORAL | 0 refills | Status: DC

## 2022-12-02 MED ORDER — VITAMIN D (ERGOCALCIFEROL) 1.25 MG (50000 UNIT) PO CAPS: 50000.0000 [IU] | ORAL_CAPSULE | ORAL | 0 refills | Status: DC

## 2022-12-02 NOTE — Assessment & Plan Note (Signed)
Encourage low-carb diet and exercise for weight loss 

## 2022-12-02 NOTE — Assessment & Plan Note (Signed)
Encourage weight loss as this can reduce joint pain

## 2022-12-02 NOTE — Assessment & Plan Note (Signed)
Will trial Contrave, sample provided Encourage low-carb diet and exercise for weight loss

## 2022-12-02 NOTE — Assessment & Plan Note (Signed)
Reinforced DASH diet and exercise for weight loss 

## 2022-12-02 NOTE — Assessment & Plan Note (Signed)
Encourage low-fat diet and exercise for weight loss

## 2022-12-02 NOTE — Patient Instructions (Signed)
Calorie Counting for Weight Loss Calories are units of energy. Your body needs a certain number of calories from food to keep going throughout the day. When you eat or drink more calories than your body needs, your body stores the extra calories mostly as fat. When you eat or drink fewer calories than your body needs, your body burns fat to get the energy it needs. Calorie counting means keeping track of how many calories you eat and drink each day. Calorie counting can be helpful if you need to lose weight. If you eat fewer calories than your body needs, you should lose weight. Ask your health care provider what a healthy weight is for you. For calorie counting to work, you will need to eat the right number of calories each day to lose a healthy amount of weight per week. A dietitian can help you figure out how many calories you need in a day and will suggest ways to reach your calorie goal. A healthy amount of weight to lose each week is usually 1-2 lb (0.5-0.9 kg). This usually means that your daily calorie intake should be reduced by 500-750 calories. Eating 1,200-1,500 calories a day can help most women lose weight. Eating 1,500-1,800 calories a day can help most men lose weight. What do I need to know about calorie counting? Work with your health care provider or dietitian to determine how many calories you should get each day. To meet your daily calorie goal, you will need to: Find out how many calories are in each food that you would like to eat. Try to do this before you eat. Decide how much of the food you plan to eat. Keep a food log. Do this by writing down what you ate and how many calories it had. To successfully lose weight, it is important to balance calorie counting with a healthy lifestyle that includes regular activity. Where do I find calorie information?  The number of calories in a food can be found on a Nutrition Facts label. If a food does not have a Nutrition Facts label, try  to look up the calories online or ask your dietitian for help. Remember that calories are listed per serving. If you choose to have more than one serving of a food, you will have to multiply the calories per serving by the number of servings you plan to eat. For example, the label on a package of bread might say that a serving size is 1 slice and that there are 90 calories in a serving. If you eat 1 slice, you will have eaten 90 calories. If you eat 2 slices, you will have eaten 180 calories. How do I keep a food log? After each time that you eat, record the following in your food log as soon as possible: What you ate. Be sure to include toppings, sauces, and other extras on the food. How much you ate. This can be measured in cups, ounces, or number of items. How many calories were in each food and drink. The total number of calories in the food you ate. Keep your food log near you, such as in a pocket-sized notebook or on an app or website on your mobile phone. Some programs will calculate calories for you and show you how many calories you have left to meet your daily goal. What are some portion-control tips? Know how many calories are in a serving. This will help you know how many servings you can have of a certain   food. Use a measuring cup to measure serving sizes. You could also try weighing out portions on a kitchen scale. With time, you will be able to estimate serving sizes for some foods. Take time to put servings of different foods on your favorite plates or in your favorite bowls and cups so you know what a serving looks like. Try not to eat straight from a food's packaging, such as from a bag or box. Eating straight from the package makes it hard to see how much you are eating and can lead to overeating. Put the amount you would like to eat in a cup or on a plate to make sure you are eating the right portion. Use smaller plates, glasses, and bowls for smaller portions and to prevent  overeating. Try not to multitask. For example, avoid watching TV or using your computer while eating. If it is time to eat, sit down at a table and enjoy your food. This will help you recognize when you are full. It will also help you be more mindful of what and how much you are eating. What are tips for following this plan? Reading food labels Check the calorie count compared with the serving size. The serving size may be smaller than what you are used to eating. Check the source of the calories. Try to choose foods that are high in protein, fiber, and vitamins, and low in saturated fat, trans fat, and sodium. Shopping Read nutrition labels while you shop. This will help you make healthy decisions about which foods to buy. Pay attention to nutrition labels for low-fat or fat-free foods. These foods sometimes have the same number of calories or more calories than the full-fat versions. They also often have added sugar, starch, or salt to make up for flavor that was removed with the fat. Make a grocery list of lower-calorie foods and stick to it. Cooking Try to cook your favorite foods in a healthier way. For example, try baking instead of frying. Use low-fat dairy products. Meal planning Use more fruits and vegetables. One-half of your plate should be fruits and vegetables. Include lean proteins, such as chicken, turkey, and fish. Lifestyle Each week, aim to do one of the following: 150 minutes of moderate exercise, such as walking. 75 minutes of vigorous exercise, such as running. General information Know how many calories are in the foods you eat most often. This will help you calculate calorie counts faster. Find a way of tracking calories that works for you. Get creative. Try different apps or programs if writing down calories does not work for you. What foods should I eat?  Eat nutritious foods. It is better to have a nutritious, high-calorie food, such as an avocado, than a food with  few nutrients, such as a bag of potato chips. Use your calories on foods and drinks that will fill you up and will not leave you hungry soon after eating. Examples of foods that fill you up are nuts and nut butters, vegetables, lean proteins, and high-fiber foods such as whole grains. High-fiber foods are foods with more than 5 g of fiber per serving. Pay attention to calories in drinks. Low-calorie drinks include water and unsweetened drinks. The items listed above may not be a complete list of foods and beverages you can eat. Contact a dietitian for more information. What foods should I limit? Limit foods or drinks that are not good sources of vitamins, minerals, or protein or that are high in unhealthy fats. These   include: Candy. Other sweets. Sodas, specialty coffee drinks, alcohol, and juice. The items listed above may not be a complete list of foods and beverages you should avoid. Contact a dietitian for more information. How do I count calories when eating out? Pay attention to portions. Often, portions are much larger when eating out. Try these tips to keep portions smaller: Consider sharing a meal instead of getting your own. If you get your own meal, eat only half of it. Before you start eating, ask for a container and put half of your meal into it. When available, consider ordering smaller portions from the menu instead of full portions. Pay attention to your food and drink choices. Knowing the way food is cooked and what is included with the meal can help you eat fewer calories. If calories are listed on the menu, choose the lower-calorie options. Choose dishes that include vegetables, fruits, whole grains, low-fat dairy products, and lean proteins. Choose items that are boiled, broiled, grilled, or steamed. Avoid items that are buttered, battered, fried, or served with cream sauce. Items labeled as crispy are usually fried, unless stated otherwise. Choose water, low-fat milk,  unsweetened iced tea, or other drinks without added sugar. If you want an alcoholic beverage, choose a lower-calorie option, such as a glass of wine or light beer. Ask for dressings, sauces, and syrups on the side. These are usually high in calories, so you should limit the amount you eat. If you want a salad, choose a garden salad and ask for grilled meats. Avoid extra toppings such as bacon, cheese, or fried items. Ask for the dressing on the side, or ask for olive oil and vinegar or lemon to use as dressing. Estimate how many servings of a food you are given. Knowing serving sizes will help you be aware of how much food you are eating at restaurants. Where to find more information Centers for Disease Control and Prevention: www.cdc.gov U.S. Department of Agriculture: myplate.gov Summary Calorie counting means keeping track of how many calories you eat and drink each day. If you eat fewer calories than your body needs, you should lose weight. A healthy amount of weight to lose per week is usually 1-2 lb (0.5-0.9 kg). This usually means reducing your daily calorie intake by 500-750 calories. The number of calories in a food can be found on a Nutrition Facts label. If a food does not have a Nutrition Facts label, try to look up the calories online or ask your dietitian for help. Use smaller plates, glasses, and bowls for smaller portions and to prevent overeating. Use your calories on foods and drinks that will fill you up and not leave you hungry shortly after a meal. This information is not intended to replace advice given to you by your health care provider. Make sure you discuss any questions you have with your health care provider. Document Revised: 04/21/2019 Document Reviewed: 04/21/2019 Elsevier Patient Education  2023 Elsevier Inc.  

## 2022-12-02 NOTE — Progress Notes (Signed)
Subjective:    Patient ID: Gabriella Riley, female    DOB: [birth year], 47 y.o.   MRN: [id number]  HPI  Patient presents to clinic today to discuss her weight loss options.  Her weight today is 2080 pounds with a BMI of 4325.  She has a history of HTN, HLD, prediabetes and arthritis in her knees. She has been on phentermine in the past, but did not have any success with this. She was on mounjaro for a few months, lost about 15 lbs but insurance stopped paying for this. She has also seen healthy weight and wellness with minimal results.   Review of Systems     Past Medical History:  Diagnosis Date   Anemia    Angio-edema    Arthritis    Back pain    Bilateral swelling of feet and ankles    Constipation    Food allergy    GERD (gastroesophageal reflux disease)    History of blood clots    Hyperlipidemia    Hypertension    Joint pain    Lactose intolerance    Other fatigue    Shortness of breath    Shortness of breath on exertion    Syphilis 07/02/2022   Urticaria    Vitamin D deficiency     Current Outpatient Medications  Medication Sig Dispense Refill   acetaminophen (TYLENOL) 500 MG tablet Take 500-1,000 mg by mouth every 6 (six) hours as needed (for pain.).     albuterol (VENTOLIN HFA) 108 (90 Base) MCG/ACT inhaler Inhale 2 puffs into the lungs every 6 (six) hours as needed for wheezing or shortness of breath. 8 g 0   azelastine (ASTELIN) 0.1 % nasal spray Place 1 spray into both nostrils 2 (two) times daily as needed for rhinitis or allergies. Use in each nostril as directed 30 mL 5   celecoxib (CELEBREX) 100 MG capsule Take 1 capsule (100 mg total) by mouth 2 (two) times daily. 30 capsule 1   cetirizine (ZYRTEC) 10 MG tablet Take 1 tablet (10 mg total) by mouth daily. 30 tablet 0   chlorthalidone (HYGROTON) 25 MG tablet Take 1 tablet (25 mg total) by mouth daily. 90 tablet 1   famotidine (PEPCID) 20 MG tablet Take 1 tablet (20 mg total) by mouth 2 (two) times daily as  needed for heartburn or indigestion. 60 tablet 5   fexofenadine (ALLEGRA ALLERGY) 180 MG tablet Take 1 tablet (180 mg total) by mouth 2 (two) times daily as needed. 60 tablet 5   fluticasone (FLONASE) 50 MCG/ACT nasal spray Place 2 sprays into both nostrils daily. 16 g 5   Multiple Vitamins-Minerals (ADULT GUMMY PO) Take 2 tablets by mouth daily.     Olopatadine HCl 0.2 % SOLN Apply 1 drop to eye daily as needed. 2.5 mL 5   simvastatin (ZOCOR) 10 MG tablet Take 1 tablet (10 mg total) by mouth at bedtime. 90 tablet 1   traZODone (DESYREL) 50 MG tablet Take 0.5-1 tablets (25-50 mg total) by mouth at bedtime as needed for sleep. 90 tablet 0   Vitamin D, Ergocalciferol, (DRISDOL) 1.25 MG (50000 UNIT) CAPS capsule Take 1 capsule (50,000 Units total) by mouth every 7 (seven) days. 4 capsule 0   No current facility-administered medications for this visit.    Allergies  Allergen Reactions   Shellfish Allergy    Latex Rash    Family History  Problem Relation Age of Onset   Allergic rhinitis Mother  Healthy Mother    Obesity Father    Sleep apnea Father    Heart disease Father    Diabetes Father    Hyperlipidemia Father    Hypertension Father    Heart failure Father    Healthy Sister    Prostate cancer Brother    Heart attack Brother    Arthritis Maternal Grandmother    Heart disease Other    Sleep apnea Other    Obesity Other    Colon cancer Neg Hx    Colon polyps Neg Hx    Esophageal cancer Neg Hx    Rectal cancer Neg Hx    Stomach cancer Neg Hx     Social History   Socioeconomic History   Marital status: Married    Spouse name: Will   Number of children: 4   Years of education: Not on file   Highest education level: Not on file  Occupational History   Occupation: Sport and exercise psychologist    Employer: [employer]  Tobacco Use   Smoking status: Never    Passive exposure: Never   Smokeless tobacco: Never  Vaping Use   Vaping status: Never Used   Substance and Sexual Activity   Alcohol use: Yes    Alcohol/week: 0.0 standard drinks of alcohol    Comment: social   Drug use: No   Sexual activity: Yes    Birth control/protection: Surgical, I.U.D.    Comment: tubal ligation   Other Topics Concern   Not on file  Social History Narrative   Not on file   Social Determinants of Health   Financial Resource Strain: Not on file  Food Insecurity: No Food Insecurity (07/16/2022)   Hunger Vital Sign    Worried About Running Out of Food in the Last Year: Never true    Ran Out of Food in the Last Year: Never true  Transportation Needs: No Transportation Needs (07/16/2022)   PRAPARE - Administrator, Civil Service (Medical): No    Lack of Transportation (Non-Medical): No  Physical Activity: Not on file  Stress: Not on file  Social Connections: Not on file  Intimate Partner Violence: Not on file     Constitutional: Denies fever, malaise, fatigue, headache or abrupt weight changes.  Respiratory: Denies difficulty breathing, shortness of breath, cough or sputum production.   Cardiovascular: Denies chest pain, chest tightness, palpitations or swelling in the hands or feet.  Musculoskeletal: Patient reports knee pain.  Denies decrease in range of motion, difficulty with gait, muscle pain or joint swelling.  Skin: Denies redness, rashes, lesions or ulcercations.  Neurological: Denies dizziness, difficulty with memory, difficulty with speech or problems with balance and coordination.  Psych: Denies anxiety, depression, SI/HI.  No other specific complaints in a complete review of systems (except as listed in HPI above).  Objective:   Physical Exam   BP 116/78 (BP Location: Left Arm, Patient Position: Sitting, Cuff Size: Large)   Pulse 66   Temp (!) 96 F (35.6 C) (Temporal)   Ht 5\' 7"  (1.702 m)   Wt 280 lb (127 kg)   SpO2 98%   BMI 43.85 kg/m   Wt Readings from Last 3 Encounters:  08/11/22 281 lb (127.5 kg)  07/23/22  274 lb 11.2 oz (124.6 kg)  07/16/22 272 lb 8 oz (123.6 kg)    General: Appears her stated age, obese, in NAD. Cardiovascular: Normal rate and rhythm.  Pulmonary/Chest: Normal effort and positive vesicular breath sounds. No respiratory distress.  Musculoskeletal: No difficulty with gait.  Neurological: Alert and oriented.  Psychiatric: Mood and affect normal. Behavior is normal. Judgment and thought content normal.     BMET    Component Value Date/Time   NA 140 08/11/2022 1548   NA 143 06/17/2022 1113   K 4.0 08/11/2022 1548   CL 105 08/11/2022 1548   CO2 27 08/11/2022 1548   GLUCOSE 79 08/11/2022 1548   BUN 20 08/11/2022 1548   BUN 11 06/17/2022 1113   CREATININE 1.09 (H) 08/11/2022 1548   CALCIUM 9.5 08/11/2022 1548   GFRNONAA 80 09/28/2017 1005   GFRAA 92 09/28/2017 1005    Lipid Panel     Component Value Date/Time   CHOL 195 08/11/2022 1548   CHOL 186 09/20/2020 0844   TRIG 67 08/11/2022 1548   HDL 52 08/11/2022 1548   HDL 47 09/20/2020 0844   CHOLHDL 3.8 08/11/2022 1548   LDLCALC 127 (H) 08/11/2022 1548    CBC    Component Value Date/Time   WBC 11.6 (H) 08/11/2022 1548   RBC 5.09 08/11/2022 1548   HGB 11.8 08/11/2022 1548   HGB 12.8 06/17/2022 1113   HCT 38.4 08/11/2022 1548   HCT 42.3 06/17/2022 1113   PLT 278 08/11/2022 1548   PLT 286 06/17/2022 1113   MCV 75.4 (L) 08/11/2022 1548   MCV 77 (L) 06/17/2022 1113   MCH 23.2 (L) 08/11/2022 1548   MCHC 30.7 (L) 08/11/2022 1548   RDW 14.5 08/11/2022 1548   RDW 14.4 06/17/2022 1113   LYMPHSABS 2.0 06/17/2022 1113   EOSABS 0.2 06/17/2022 1113   BASOSABS 0.0 06/17/2022 1113    Hgb A1C Lab Results  Component Value Date   HGBA1C 5.9 (H) 08/11/2022           Assessment & Plan:      RTC in 2 months for annual exam Nicki Reaper, NP

## 2022-12-22 ENCOUNTER — Other Ambulatory Visit: Payer: Self-pay | Admitting: Internal Medicine

## 2022-12-22 DIAGNOSIS — E559 Vitamin D deficiency, unspecified: Secondary | ICD-10-CM

## 2022-12-23 NOTE — Telephone Encounter (Signed)
Requested medication (s) are due for refill today: yes  Requested medication (s) are on the active medication list: yes  Last refill:  12/02/22  Future visit scheduled: yes  Notes to clinic:  Manual Review: Route requests for 50,000 IU strength to the provider.      Requested Prescriptions  Pending Prescriptions Disp Refills   Vitamin D, Ergocalciferol, (DRISDOL) 1.25 MG (50000 UNIT) CAPS capsule [Pharmacy Med Name: VITAMIN D2 1.25MG (50,000 UNIT)] 12 capsule 1    Sig: Take 1 capsule (50,000 Units total) by mouth every 7 (seven) days.     Endocrinology:  Vitamins - Vitamin D Supplementation 2 Failed - 12/22/2022  2:32 PM      Failed - Manual Review: Route requests for 50,000 IU strength to the provider      Failed - Vitamin D in normal range and within 360 days    Vit D, 25-Hydroxy  Date Value Ref Range Status  09/20/2020 16.9 (L) 30.0 - 100.0 ng/mL Final    Comment:    Vitamin D deficiency has been defined by the Institute of Medicine and an Endocrine Society practice guideline as a level of serum 25-OH vitamin D less than 20 ng/mL (1,2). The Endocrine Society went on to further define vitamin D insufficiency as a level between 21 and 29 ng/mL (2). 1. IOM (Institute of Medicine). 2010. Dietary reference    intakes for calcium and D. [place]: The    [employer]. 2. Holick MF, Binkley [place], Bischoff-Ferrari HA, et al.    Evaluation, treatment, and prevention of vitamin D    deficiency: an Endocrine Society clinical practice    guideline. JCEM. 2011 Jul; 96(7):1911-30.          Passed - Ca in normal range and within 360 days    Calcium  Date Value Ref Range Status  08/11/2022 9.5 8.6 - 10.2 mg/dL Final   Calcium, Ion  Date Value Ref Range Status  12/05/2008 1.19 1.12 - 1.32 mmol/L Final         Passed - Valid encounter within last 12 months    Recent Outpatient Visits           3 weeks ago Class 3 severe obesity due to excess calories with serious  comorbidity and body mass index (BMI) of 40.0 to 44.9 in adult [facility])   [facility] [place], Salvadore Oxford, NP   4 months ago Arthritis of both knees   [facility] [place], Salvadore Oxford, NP   10 months ago Encounter for general adult medical examination with abnormal findings   [facility] [place], Salvadore Oxford, NP   11 months ago Angioedema, initial encounter   [facility] [place], Salvadore Oxford, NP   1 year ago Essential hypertension   [place] [facility], Jackelyn Poling, RPH-CPP       Future Appointments             In 1 month Baity, Salvadore Oxford, NP  [facility], [facility]

## 2023-01-15 ENCOUNTER — Telehealth: Payer: BC Managed Care – PPO | Admitting: Family Medicine

## 2023-01-15 DIAGNOSIS — S90461A Insect bite (nonvenomous), right great toe, initial encounter: Secondary | ICD-10-CM | POA: Diagnosis not present

## 2023-01-15 DIAGNOSIS — L539 Erythematous condition, unspecified: Secondary | ICD-10-CM | POA: Diagnosis not present

## 2023-01-15 DIAGNOSIS — W57XXXA Bitten or stung by nonvenomous insect and other nonvenomous arthropods, initial encounter: Secondary | ICD-10-CM | POA: Diagnosis not present

## 2023-01-15 MED ORDER — MUPIROCIN 2 % EX OINT: 1.0000 | TOPICAL_OINTMENT | Freq: Two times a day (BID) | CUTANEOUS | 0 refills | Status: DC

## 2023-01-15 NOTE — Patient Instructions (Addendum)
Gabriella Riley, thank you for joining Freddy Finner, NP for today's virtual visit.  While this provider is not your primary care provider (PCP), if your PCP is located in our provider database this encounter information will be shared with them immediately following your visit.   A [facility] MyChart account gives you access to today's visit and all your visits, tests, and labs performed at [facility] " click here if you don't have a [facility] MyChart account or go to [email or web address]  Consent: (Patient) Gabriella Riley provided verbal consent for this virtual visit at the beginning of the encounter.  Current Medications:  Current Outpatient Medications:    mupirocin ointment (BACTROBAN) 2 %, Apply 1 Application topically 2 (two) times daily., Disp: 22 g, Rfl: 0   acetaminophen (TYLENOL) 500 MG tablet, Take 500-1,000 mg by mouth every 6 (six) hours as needed (for pain.)., Disp: , Rfl:    albuterol (VENTOLIN HFA) 108 (90 Base) MCG/ACT inhaler, Inhale 2 puffs into the lungs every 6 (six) hours as needed for wheezing or shortness of breath., Disp: 8 g, Rfl: 0   azelastine (ASTELIN) 0.1 % nasal spray, Place 1 spray into both nostrils 2 (two) times daily as needed for rhinitis or allergies. Use in each nostril as directed, Disp: 30 mL, Rfl: 5   celecoxib (CELEBREX) 100 MG capsule, Take 1 capsule (100 mg total) by mouth 2 (two) times daily., Disp: 30 capsule, Rfl: 1   cetirizine (ZYRTEC) 10 MG tablet, Take 1 tablet (10 mg total) by mouth daily., Disp: 30 tablet, Rfl: 0   chlorthalidone (HYGROTON) 25 MG tablet, Take 1 tablet (25 mg total) by mouth daily., Disp: 90 tablet, Rfl: 1   famotidine (PEPCID) 20 MG tablet, Take 1 tablet (20 mg total) by mouth 2 (two) times daily as needed for heartburn or indigestion., Disp: 60 tablet, Rfl: 5   fexofenadine (ALLEGRA ALLERGY) 180 MG tablet, Take 1 tablet (180 mg total) by mouth 2 (two) times daily as needed., Disp: 60 tablet, Rfl:  5   fluticasone (FLONASE) 50 MCG/ACT nasal spray, Place 2 sprays into both nostrils daily., Disp: 16 g, Rfl: 5   Multiple Vitamins-Minerals (ADULT GUMMY PO), Take 2 tablets by mouth daily., Disp: , Rfl:    Naltrexone-buPROPion HCl ER 8-90 MG TB12, Start 1 tablet every morning for 7 days, then 1 tablet twice daily for 7 days, then 2 tablets every morning and one in the evening, Disp: 120 tablet, Rfl: 0   Olopatadine HCl 0.2 % SOLN, Apply 1 drop to eye daily as needed., Disp: 2.5 mL, Rfl: 5   simvastatin (ZOCOR) 10 MG tablet, Take 1 tablet (10 mg total) by mouth at bedtime., Disp: 90 tablet, Rfl: 1   traZODone (DESYREL) 50 MG tablet, Take 0.5-1 tablets (25-50 mg total) by mouth at bedtime as needed for sleep., Disp: 90 tablet, Rfl: 0   Vitamin D, Ergocalciferol, (DRISDOL) 1.25 MG (50000 UNIT) CAPS capsule, Take 1 capsule (50,000 Units total) by mouth every 7 (seven) days., Disp: 4 capsule, Rfl: 0   Medications ordered in this encounter:  Meds ordered this encounter  Medications   mupirocin ointment (BACTROBAN) 2 %    Sig: Apply 1 Application topically 2 (two) times daily.    Dispense:  22 g    Refill:  0    Order Specific Question:   Supervising Provider    Answer:   Merrilee Jansky [id number]     *If you need refills on  other medications prior to your next appointment, please contact your pharmacy*  Follow-Up: Call back or seek an in-person evaluation if the symptoms worsen or if the condition fails to improve as anticipated.  [facility] [phone]  Other Instructions   -keep area clean -apply ointment twice daily as directed -if symptoms worsen- redness, stiffness, pain, streaking, or warm- fevers then ASAP in person   If you have been instructed to have an in-person evaluation today at a local Urgent Care facility, please use the link below. It will take you to a list of all of our available [facility], including address, phone number and hours of  operation. Please do not delay care.  [facility]  If you or a family member do not have a primary care provider, use the link below to schedule a visit and establish care. When you choose a [facility] primary care physician or advanced practice provider, you gain a long-term partner in health. Find a Primary Care Provider  Learn more about [facility]'s in-office and virtual care options: [facility] - Get Care Now

## 2023-01-15 NOTE — Progress Notes (Signed)
Virtual Visit Consent   Gabriella Riley, you are scheduled for a virtual visit with a [facility] provider today. Just as with appointments in the office, your consent must be obtained to participate. Your consent will be active for this visit and any virtual visit you may have with one of our providers in the next 365 days. If you have a MyChart account, a copy of this consent can be sent to you electronically.  As this is a virtual visit, video technology does not allow for your provider to perform a traditional examination. This may limit your provider's ability to fully assess your condition. If your provider identifies any concerns that need to be evaluated in person or the need to arrange testing (such as labs, EKG, etc.), we will make arrangements to do so. Although advances in technology are sophisticated, we cannot ensure that it will always work on either your end or our end. If the connection with a video visit is poor, the visit may have to be switched to a telephone visit. With either a video or telephone visit, we are not always able to ensure that we have a secure connection.  By engaging in this virtual visit, you consent to the provision of healthcare and authorize for your insurance to be billed (if applicable) for the services provided during this visit. Depending on your insurance coverage, you may receive a charge related to this service.  I need to obtain your verbal consent now. Are you willing to proceed with your visit today? Gabriella Riley has provided verbal consent on 01/15/2023 for a virtual visit (video or telephone). Freddy Finner, NP  Date: 01/15/2023 10:58 AM  Virtual Visit via Video Note   I, Freddy Finner, connected with  Gabriella Riley  ([id number], 1976/02/21) on 01/15/23 at 11:00 AM EDT by a video-enabled telemedicine application and verified that I am speaking with the correct person using two identifiers.  Location: Patient: Virtual Visit Location  Patient: Home Provider: Virtual Visit Location Provider: Home Office   I discussed the limitations of evaluation and management by telemedicine and the availability of in person appointments. The patient expressed understanding and agreed to proceed.    History of Present Illness: Gabriella Riley is a 47 y.o. who identifies as a female who was assigned female at birth, and is being seen today for bite on toe  Onset was last night  was at a game outside- foot under the bleachers, and started to walk and felt the great toe on the right side was hurting, notice there was a bump/bite on it. Associated symptoms are woke up this morning with redness around the area and it is firm at bite and tight around it.Pain with moving. 7/10 Modifying factors are applied neosporin and bandaid overnight Denies chest pain, shortness of breath, fevers, chills   Problems:  Patient Active Problem List   Diagnosis Date Noted   Insomnia 08/11/2022   Prediabetes 12/19/2020   HLD (hyperlipidemia) 12/19/2020   Class 3 severe obesity due to excess calories with body mass index (BMI) of 40.0 to 44.9 in adult [facility]) 12/19/2020   Essential hypertension 10/04/2019   Arthritis of both knees 01/02/2015    Allergies:  Allergies  Allergen Reactions   Shellfish Allergy    Latex Rash   Medications:  Current Outpatient Medications:    acetaminophen (TYLENOL) 500 MG tablet, Take 500-1,000 mg by mouth every 6 (six) hours as needed (for pain.)., Disp: ,  Rfl:    albuterol (VENTOLIN HFA) 108 (90 Base) MCG/ACT inhaler, Inhale 2 puffs into the lungs every 6 (six) hours as needed for wheezing or shortness of breath., Disp: 8 g, Rfl: 0   azelastine (ASTELIN) 0.1 % nasal spray, Place 1 spray into both nostrils 2 (two) times daily as needed for rhinitis or allergies. Use in each nostril as directed, Disp: 30 mL, Rfl: 5   celecoxib (CELEBREX) 100 MG capsule, Take 1 capsule (100 mg total) by mouth 2 (two) times daily., Disp: 30  capsule, Rfl: 1   cetirizine (ZYRTEC) 10 MG tablet, Take 1 tablet (10 mg total) by mouth daily., Disp: 30 tablet, Rfl: 0   chlorthalidone (HYGROTON) 25 MG tablet, Take 1 tablet (25 mg total) by mouth daily., Disp: 90 tablet, Rfl: 1   famotidine (PEPCID) 20 MG tablet, Take 1 tablet (20 mg total) by mouth 2 (two) times daily as needed for heartburn or indigestion., Disp: 60 tablet, Rfl: 5   fexofenadine (ALLEGRA ALLERGY) 180 MG tablet, Take 1 tablet (180 mg total) by mouth 2 (two) times daily as needed., Disp: 60 tablet, Rfl: 5   fluticasone (FLONASE) 50 MCG/ACT nasal spray, Place 2 sprays into both nostrils daily., Disp: 16 g, Rfl: 5   Multiple Vitamins-Minerals (ADULT GUMMY PO), Take 2 tablets by mouth daily., Disp: , Rfl:    Naltrexone-buPROPion HCl ER 8-90 MG TB12, Start 1 tablet every morning for 7 days, then 1 tablet twice daily for 7 days, then 2 tablets every morning and one in the evening, Disp: 120 tablet, Rfl: 0   Olopatadine HCl 0.2 % SOLN, Apply 1 drop to eye daily as needed., Disp: 2.5 mL, Rfl: 5   simvastatin (ZOCOR) 10 MG tablet, Take 1 tablet (10 mg total) by mouth at bedtime., Disp: 90 tablet, Rfl: 1   traZODone (DESYREL) 50 MG tablet, Take 0.5-1 tablets (25-50 mg total) by mouth at bedtime as needed for sleep., Disp: 90 tablet, Rfl: 0   Vitamin D, Ergocalciferol, (DRISDOL) 1.25 MG (50000 UNIT) CAPS capsule, Take 1 capsule (50,000 Units total) by mouth every 7 (seven) days., Disp: 4 capsule, Rfl: 0  Observations/Objective: Patient is well-developed, well-nourished in no acute distress.  Resting comfortably  at home.  Head is normocephalic, atraumatic.  No labored breathing.  Speech is clear and coherent with logical content.  Patient is alert and oriented at baseline.  Right great toe- has a single puncture lesion that is raised and red around it Mobility intact   Assessment and Plan:    1. Redness   2. Insect bite of right great toe, initial encounter  - mupirocin  ointment (BACTROBAN) 2 %; Apply 1 Application topically 2 (two) times daily.  Dispense: 22 g; Refill: 0   -keep area clean -apply ointment twice daily as directed -if symptoms worsen- redness, stiffness, pain, streaking, or warm- fevers then ASAP in person   Reviewed side effects, risks and benefits of medication.    Patient acknowledged agreement and understanding of the plan.   Past Medical, Surgical, Social History, Allergies, and Medications have been Reviewed.   Follow Up Instructions: I discussed the assessment and treatment plan with the patient. The patient was provided an opportunity to ask questions and all were answered. The patient agreed with the plan and demonstrated an understanding of the instructions.  A copy of instructions were sent to the patient via MyChart unless otherwise noted below.     The patient was advised to call back or seek an  in-person evaluation if the symptoms worsen or if the condition fails to improve as anticipated.    Freddy Finner, NP

## 2023-02-09 ENCOUNTER — Other Ambulatory Visit: Payer: Self-pay | Admitting: Internal Medicine

## 2023-02-09 NOTE — Telephone Encounter (Signed)
Requested Prescriptions  Pending Prescriptions Disp Refills   traZODone (DESYREL) 50 MG tablet [Pharmacy Med Name: TRAZODONE 50 MG TABLET] 90 tablet 1    Sig: TAKE 0.5-1 TABLET AT BEDTIME AS NEEDED FOR SLEEP.     Psychiatry: Antidepressants - Serotonin Modulator Passed - 02/09/2023  1:18 AM      Passed - Valid encounter within last 6 months    Recent Outpatient Visits           2 months ago Class 3 severe obesity due to excess calories with serious comorbidity and body mass index (BMI) of 40.0 to 44.9 in adult [facility])   [facility] [place], Salvadore Oxford, NP   6 months ago Arthritis of both knees   [facility] [place], Salvadore Oxford, NP   1 year ago Encounter for general adult medical examination with abnormal findings   [facility] [place], Salvadore Oxford, NP   1 year ago Angioedema, initial encounter   [facility] [place], Salvadore Oxford, NP   1 year ago Essential hypertension   [facility] [place], Jackelyn Poling, RPH-CPP       Future Appointments             In 3 days [place], Salvadore Oxford, NP [facility], [facility]

## 2023-02-12 ENCOUNTER — Ambulatory Visit (INDEPENDENT_AMBULATORY_CARE_PROVIDER_SITE_OTHER): Payer: BC Managed Care – PPO | Admitting: Internal Medicine

## 2023-02-12 ENCOUNTER — Encounter: Payer: Self-pay | Admitting: Internal Medicine

## 2023-02-12 VITALS — BP 140/80 | Ht 67.0 in | Wt 278.0 lb

## 2023-02-12 DIAGNOSIS — E66813 Obesity, class 3: Secondary | ICD-10-CM

## 2023-02-12 DIAGNOSIS — E7800 Pure hypercholesterolemia, unspecified: Secondary | ICD-10-CM | POA: Diagnosis not present

## 2023-02-12 DIAGNOSIS — R7303 Prediabetes: Secondary | ICD-10-CM

## 2023-02-12 DIAGNOSIS — Z0001 Encounter for general adult medical examination with abnormal findings: Secondary | ICD-10-CM | POA: Diagnosis not present

## 2023-02-12 DIAGNOSIS — Z6841 Body Mass Index (BMI) 40.0 and over, adult: Secondary | ICD-10-CM

## 2023-02-12 DIAGNOSIS — E559 Vitamin D deficiency, unspecified: Secondary | ICD-10-CM

## 2023-02-12 MED ORDER — VITAMIN D (ERGOCALCIFEROL) 1.25 MG (50000 UNIT) PO CAPS: 50000.0000 [IU] | ORAL_CAPSULE | ORAL | 0 refills | Status: DC

## 2023-02-12 NOTE — Progress Notes (Signed)
Subjective:    Patient ID: Gabriella Riley, female    DOB: 01/01/1976, 47 y.o.   MRN: [id number]  HPI  Patient presents to clinic today for her annual exam.  Flu: Never Tetanus: 06/2017 COVID: Pfizer x3 Pap smear: 05/2021 Mammogram: 04/2022 Colon screening: 01/2022 Vision screening: annually Dentist: biannually  Diet: She does eat meat. She consumes fruits and veggies. She does eat some fried foods. She drinks mostly water, soda Exercise: None  Review of Systems     Past Medical History:  Diagnosis Date   Anemia    Angio-edema    Arthritis    Back pain    Bilateral swelling of feet and ankles    Constipation    Food allergy    GERD (gastroesophageal reflux disease)    History of blood clots    Hyperlipidemia    Hypertension    Joint pain    Lactose intolerance    Other fatigue    Shortness of breath    Shortness of breath on exertion    Syphilis 07/02/2022   Urticaria    Vitamin D deficiency     Current Outpatient Medications  Medication Sig Dispense Refill   acetaminophen (TYLENOL) 500 MG tablet Take 500-1,000 mg by mouth every 6 (six) hours as needed (for pain.).     albuterol (VENTOLIN HFA) 108 (90 Base) MCG/ACT inhaler Inhale 2 puffs into the lungs every 6 (six) hours as needed for wheezing or shortness of breath. 8 g 0   azelastine (ASTELIN) 0.1 % nasal spray Place 1 spray into both nostrils 2 (two) times daily as needed for rhinitis or allergies. Use in each nostril as directed 30 mL 5   celecoxib (CELEBREX) 100 MG capsule Take 1 capsule (100 mg total) by mouth 2 (two) times daily. 30 capsule 1   cetirizine (ZYRTEC) 10 MG tablet Take 1 tablet (10 mg total) by mouth daily. 30 tablet 0   chlorthalidone (HYGROTON) 25 MG tablet Take 1 tablet (25 mg total) by mouth daily. 90 tablet 1   famotidine (PEPCID) 20 MG tablet Take 1 tablet (20 mg total) by mouth 2 (two) times daily as needed for heartburn or indigestion. 60 tablet 5   fexofenadine (ALLEGRA ALLERGY)  180 MG tablet Take 1 tablet (180 mg total) by mouth 2 (two) times daily as needed. 60 tablet 5   fluticasone (FLONASE) 50 MCG/ACT nasal spray Place 2 sprays into both nostrils daily. 16 g 5   Multiple Vitamins-Minerals (ADULT GUMMY PO) Take 2 tablets by mouth daily.     mupirocin ointment (BACTROBAN) 2 % Apply 1 Application topically 2 (two) times daily. 22 g 0   Naltrexone-buPROPion HCl ER 8-90 MG TB12 Start 1 tablet every morning for 7 days, then 1 tablet twice daily for 7 days, then 2 tablets every morning and one in the evening 120 tablet 0   Olopatadine HCl 0.2 % SOLN Apply 1 drop to eye daily as needed. 2.5 mL 5   simvastatin (ZOCOR) 10 MG tablet Take 1 tablet (10 mg total) by mouth at bedtime. 90 tablet 1   traZODone (DESYREL) 50 MG tablet TAKE 0.5-1 TABLET AT BEDTIME AS NEEDED FOR SLEEP. 90 tablet 1   Vitamin D, Ergocalciferol, (DRISDOL) 1.25 MG (50000 UNIT) CAPS capsule Take 1 capsule (50,000 Units total) by mouth every 7 (seven) days. 4 capsule 0   No current facility-administered medications for this visit.    Allergies  Allergen Reactions   Shellfish Allergy  Latex Rash    Family History  Problem Relation Age of Onset   Allergic rhinitis Mother    Healthy Mother    Obesity Father    Sleep apnea Father    Heart disease Father    Diabetes Father    Hyperlipidemia Father    Hypertension Father    Heart failure Father    Healthy Sister    Prostate cancer Brother    Heart attack Brother    Arthritis Maternal Grandmother    Heart disease Other    Sleep apnea Other    Obesity Other    Colon cancer Neg Hx    Colon polyps Neg Hx    Esophageal cancer Neg Hx    Rectal cancer Neg Hx    Stomach cancer Neg Hx     Social History   Socioeconomic History   Marital status: Married    Spouse name: Will   Number of children: 4   Years of education: Not on file   Highest education level: Not on file  Occupational History   Occupation: Sport and exercise psychologist     Employer: [employer]  Tobacco Use   Smoking status: Never    Passive exposure: Never   Smokeless tobacco: Never  Vaping Use   Vaping status: Never Used  Substance and Sexual Activity   Alcohol use: Yes    Alcohol/week: 0.0 standard drinks of alcohol    Comment: social   Drug use: No   Sexual activity: Yes    Birth control/protection: Surgical, I.U.D.    Comment: tubal ligation   Other Topics Concern   Not on file  Social History Narrative   Not on file   Social Determinants of Health   Financial Resource Strain: Not on file  Food Insecurity: No Food Insecurity (07/16/2022)   Hunger Vital Sign    Worried About Running Out of Food in the Last Year: Never true    Ran Out of Food in the Last Year: Never true  Transportation Needs: No Transportation Needs (07/16/2022)   PRAPARE - Administrator, Civil Service (Medical): No    Lack of Transportation (Non-Medical): No  Physical Activity: Not on file  Stress: Not on file  Social Connections: Not on file  Intimate Partner Violence: Not on file     Constitutional: Denies fever, malaise, fatigue, headache or abrupt weight changes.  HEENT: Denies eye pain, eye redness, ear pain, ringing in the ears, wax buildup, runny nose, nasal congestion, bloody nose, or sore throat. Respiratory: Denies difficulty breathing, shortness of breath, cough or sputum production.   Cardiovascular: Denies chest pain, chest tightness, palpitations or swelling in the hands or feet.  Gastrointestinal: Pt reports reflux. Denies abdominal pain, bloating, constipation, diarrhea or blood in the stool.  GU: Denies urgency, frequency, pain with urination, burning sensation, blood in urine, odor or discharge. Musculoskeletal: Patient reports knee pain.  Denies decrease in range of motion, difficulty with gait, muscle pain or joint swelling.  Skin: Denies redness, rashes, lesions or ulcercations.  Neurological: Pt reports insomnia. Denies  dizziness, difficulty with memory, difficulty with speech or problems with balance and coordination.  Psych: Denies anxiety, depression, SI/HI.  No other specific complaints in a complete review of systems (except as listed in HPI above).  Objective:   Physical Exam  BP (!) 152/88   Ht 5\' 7"  (1.702 m)   Wt 278 lb (126.1 kg)   BMI 43.54 kg/m    Wt Readings from Last  3 Encounters:  12/02/22 280 lb (127 kg)  08/11/22 281 lb (127.5 kg)  07/23/22 274 lb 11.2 oz (124.6 kg)    General: Appears her stated age, obese, in NAD. Skin: Warm, dry and intact. No rashes noted. HEENT: Head: normal shape and size; Eyes: sclera white, no icterus, conjunctiva pink, PERRLA and EOMs intact;  Neck:  Neck supple, trachea midline. No masses, lumps or thyromegaly present.  Cardiovascular: Normal rate and rhythm. S1,S2 noted.  No murmur, rubs or gallops noted. No JVD or BLE edema.  Pulmonary/Chest: Normal effort and positive vesicular breath sounds. No respiratory distress. No wheezes, rales or ronchi noted.  Abdomen: Normal bowel sounds.  Musculoskeletal: Strength 5/5 BUE/BLE.  No difficulty with gait.  Neurological: Alert and oriented. Cranial nerves II-XII grossly intact. Coordination normal.  Psychiatric: Mood and affect normal. Behavior is normal. Judgment and thought content normal.     BMET    Component Value Date/Time   NA 140 08/11/2022 1548   NA 143 06/17/2022 1113   K 4.0 08/11/2022 1548   CL 105 08/11/2022 1548   CO2 27 08/11/2022 1548   GLUCOSE 79 08/11/2022 1548   BUN 20 08/11/2022 1548   BUN 11 06/17/2022 1113   CREATININE 1.09 (H) 08/11/2022 1548   CALCIUM 9.5 08/11/2022 1548   GFRNONAA 80 09/28/2017 1005   GFRAA 92 09/28/2017 1005    Lipid Panel     Component Value Date/Time   CHOL 195 08/11/2022 1548   CHOL 186 09/20/2020 0844   TRIG 67 08/11/2022 1548   HDL 52 08/11/2022 1548   HDL 47 09/20/2020 0844   CHOLHDL 3.8 08/11/2022 1548   LDLCALC 127 (H) 08/11/2022 1548     CBC    Component Value Date/Time   WBC 11.6 (H) 08/11/2022 1548   RBC 5.09 08/11/2022 1548   HGB 11.8 08/11/2022 1548   HGB 12.8 06/17/2022 1113   HCT 38.4 08/11/2022 1548   HCT 42.3 06/17/2022 1113   PLT 278 08/11/2022 1548   PLT 286 06/17/2022 1113   MCV 75.4 (L) 08/11/2022 1548   MCV 77 (L) 06/17/2022 1113   MCH 23.2 (L) 08/11/2022 1548   MCHC 30.7 (L) 08/11/2022 1548   RDW 14.5 08/11/2022 1548   RDW 14.4 06/17/2022 1113   LYMPHSABS 2.0 06/17/2022 1113   EOSABS 0.2 06/17/2022 1113   BASOSABS 0.0 06/17/2022 1113    Hgb A1C Lab Results  Component Value Date   HGBA1C 5.9 (H) 08/11/2022           Assessment & Plan:   Preventative Health Maintenance:  She declines flu shots Tetanus UTD Encouraged her to get her COVID booster Pap smear UTD Mammogram UTD Colonoscopy UTD Encouraged her to consume a balanced diet and exercise regimen Advised her to see an eye doctor and dentist annually We will check CBC, c-Met, lipid and A1c today  RTC in 6 months, follow-up chronic conditions Nicki Reaper, NP

## 2023-02-12 NOTE — Assessment & Plan Note (Signed)
Encouraged diet and exercise for weight loss ?

## 2023-02-12 NOTE — Patient Instructions (Signed)

## 2023-02-13 ENCOUNTER — Encounter: Payer: Self-pay | Admitting: Internal Medicine

## 2023-02-13 LAB — CBC
HCT: 38.9 % (ref 35.0–45.0)
Hemoglobin: 12.2 g/dL (ref 11.7–15.5)
MCH: 23.7 pg — ABNORMAL LOW (ref 27.0–33.0)
MCHC: 31.4 g/dL — ABNORMAL LOW (ref 32.0–36.0)
MCV: 75.5 fL — ABNORMAL LOW (ref 80.0–100.0)
MPV: 12.3 fL (ref 7.5–12.5)
Platelets: 255 10*3/uL (ref 140–400)
RBC: 5.15 10*6/uL — ABNORMAL HIGH (ref 3.80–5.10)
RDW: 13.8 % (ref 11.0–15.0)
WBC: 11.9 10*3/uL — ABNORMAL HIGH (ref 3.8–10.8)

## 2023-02-13 LAB — HEMOGLOBIN A1C
Hgb A1c MFr Bld: 5.8 %{Hb} — ABNORMAL HIGH (ref <5.7)
Mean Plasma Glucose: 120 mg/dL
eAG (mmol/L): 6.6 mmol/L

## 2023-02-13 LAB — COMPLETE METABOLIC PANEL WITH GFR
AG Ratio: 1.5 (calc) (ref 1.0–2.5)
ALT: 14 U/L (ref 6–29)
AST: 14 U/L (ref 10–35)
Albumin: 4 g/dL (ref 3.6–5.1)
Alkaline phosphatase (APISO): 84 U/L (ref 31–125)
BUN: 8 mg/dL (ref 7–25)
CO2: 28 mmol/L (ref 20–32)
Calcium: 9 mg/dL (ref 8.6–10.2)
Chloride: 102 mmol/L (ref 98–110)
Creat: 0.9 mg/dL (ref 0.50–0.99)
Globulin: 2.7 g/dL (ref 1.9–3.7)
Glucose, Bld: 66 mg/dL (ref 65–99)
Potassium: 3.7 mmol/L (ref 3.5–5.3)
Sodium: 138 mmol/L (ref 135–146)
Total Bilirubin: 0.6 mg/dL (ref 0.2–1.2)
Total Protein: 6.7 g/dL (ref 6.1–8.1)
eGFR: 80 mL/min/{1.73_m2} (ref >=60)

## 2023-02-13 LAB — LIPID PANEL
Cholesterol: 186 mg/dL (ref <200)
HDL: 49 mg/dL — ABNORMAL LOW (ref >=50)
LDL Cholesterol (Calc): 123 mg/dL — ABNORMAL HIGH
Non-HDL Cholesterol (Calc): 137 mg/dL — ABNORMAL HIGH (ref <130)
Total CHOL/HDL Ratio: 3.8 (calc) (ref <5.0)
Triglycerides: 51 mg/dL (ref <150)

## 2023-02-13 MED ORDER — SIMVASTATIN 20 MG PO TABS: 20.0000 mg | ORAL_TABLET | Freq: Every day | ORAL | 1 refills | Status: DC

## 2023-02-14 ENCOUNTER — Telehealth: Payer: BC Managed Care – PPO

## 2023-02-14 DIAGNOSIS — J019 Acute sinusitis, unspecified: Secondary | ICD-10-CM | POA: Diagnosis not present

## 2023-02-14 MED ORDER — AMOXICILLIN-POT CLAVULANATE 875-125 MG PO TABS: 1.0000 | ORAL_TABLET | Freq: Two times a day (BID) | ORAL | 0 refills | Status: AC

## 2023-02-14 NOTE — Patient Instructions (Addendum)
Gabriella Riley, thank you for joining Reed Pandy, PA-C for today's virtual visit.  While this provider is not your primary care provider (PCP), if your PCP is located in our provider database this encounter information will be shared with them immediately following your visit.   A [facility] MyChart account gives you access to today's visit and all your visits, tests, and labs performed at [facility] " click here if you don't have a [facility] MyChart account or go to [email or web address]  Consent: (Patient) Gabriella Riley provided verbal consent for this virtual visit at the beginning of the encounter.  Current Medications:  Current Outpatient Medications:    amoxicillin-clavulanate (AUGMENTIN) 875-125 MG tablet, Take 1 tablet by mouth 2 (two) times daily for 10 days., Disp: 20 tablet, Rfl: 0   acetaminophen (TYLENOL) 500 MG tablet, Take 500-1,000 mg by mouth every 6 (six) hours as needed (for pain.)., Disp: , Rfl:    albuterol (VENTOLIN HFA) 108 (90 Base) MCG/ACT inhaler, Inhale 2 puffs into the lungs every 6 (six) hours as needed for wheezing or shortness of breath., Disp: 8 g, Rfl: 0   chlorthalidone (HYGROTON) 25 MG tablet, Take 1 tablet (25 mg total) by mouth daily., Disp: 90 tablet, Rfl: 1   famotidine (PEPCID) 20 MG tablet, Take 1 tablet (20 mg total) by mouth 2 (two) times daily as needed for heartburn or indigestion., Disp: 60 tablet, Rfl: 5   fexofenadine (ALLEGRA ALLERGY) 180 MG tablet, Take 1 tablet (180 mg total) by mouth 2 (two) times daily as needed., Disp: 60 tablet, Rfl: 5   fluticasone (FLONASE) 50 MCG/ACT nasal spray, Place 2 sprays into both nostrils daily., Disp: 16 g, Rfl: 5   Multiple Vitamins-Minerals (ADULT GUMMY PO), Take 2 tablets by mouth daily., Disp: , Rfl:    simvastatin (ZOCOR) 20 MG tablet, Take 1 tablet (20 mg total) by mouth at bedtime., Disp: 90 tablet, Rfl: 1   traZODone (DESYREL) 50 MG tablet, TAKE 0.5-1 TABLET AT BEDTIME AS  NEEDED FOR SLEEP., Disp: 90 tablet, Rfl: 1   Vitamin D, Ergocalciferol, (DRISDOL) 1.25 MG (50000 UNIT) CAPS capsule, Take 1 capsule (50,000 Units total) by mouth every 7 (seven) days., Disp: 12 capsule, Rfl: 0   Medications ordered in this encounter:  Meds ordered this encounter  Medications   amoxicillin-clavulanate (AUGMENTIN) 875-125 MG tablet    Sig: Take 1 tablet by mouth 2 (two) times daily for 10 days.    Dispense:  20 tablet    Refill:  0     *If you need refills on other medications prior to your next appointment, please contact your pharmacy*  Follow-Up: Call back or seek an in-person evaluation if the symptoms worsen or if the condition fails to improve as anticipated.  [facility] [phone]  Other Instructions Sinus Infection, Adult A sinus infection, also called sinusitis, is inflammation of your sinuses. Sinuses are hollow spaces in the bones around your face. Your sinuses are located: Around your eyes. In the middle of your forehead. Behind your nose. In your cheekbones. Mucus normally drains out of your sinuses. When your nasal tissues become inflamed or swollen, mucus can become trapped or blocked. This allows bacteria, viruses, and fungi to grow, which leads to infection. Most infections of the sinuses are caused by a virus. A sinus infection can develop quickly. It can last for up to 4 weeks (acute) or for more than 12 weeks (chronic). A sinus infection often develops after  a cold. What are the causes? This condition is caused by anything that creates swelling in the sinuses or stops mucus from draining. This includes: Allergies. Asthma. Infection from bacteria or viruses. Deformities or blockages in your nose or sinuses. Abnormal growths in the nose (nasal polyps). Pollutants, such as chemicals or irritants in the air. Infection from fungi. This is rare. What increases the risk? You are more likely to develop this condition if you: Have a  weak body defense system (immune system). Do a lot of swimming or diving. Overuse nasal sprays. Smoke. What are the signs or symptoms? The main symptoms of this condition are pain and a feeling of pressure around the affected sinuses. Other symptoms include: Stuffy nose or congestion that makes it difficult to breathe through your nose. Thick yellow or greenish drainage from your nose. Tenderness, swelling, and warmth over the affected sinuses. A cough that may get worse at night. Decreased sense of smell and taste. Extra mucus that collects in the throat or the back of the nose (postnasal drip) causing a sore throat or bad breath. Tiredness (fatigue). Fever. How is this diagnosed? This condition is diagnosed based on: Your symptoms. Your medical history. A physical exam. Tests to find out if your condition is acute or chronic. This may include: Checking your nose for nasal polyps. Viewing your sinuses using a device that has a light (endoscope). Testing for allergies or bacteria. Imaging tests, such as an MRI or CT scan. In rare cases, a bone biopsy may be done to rule out more serious types of fungal sinus disease. How is this treated? Treatment for a sinus infection depends on the cause and whether your condition is chronic or acute. If caused by a virus, your symptoms should go away on their own within 10 days. You may be given medicines to relieve symptoms. They include: Medicines that shrink swollen nasal passages (decongestants). A spray that eases inflammation of the nostrils (topical intranasal corticosteroids). Rinses that help get rid of thick mucus in your nose (nasal saline washes). Medicines that treat allergies (antihistamines). Over-the-counter pain relievers. If caused by bacteria, your health care provider may recommend waiting to see if your symptoms improve. Most bacterial infections will get better without antibiotic medicine. You may be given antibiotics if you  have: A severe infection. A weak immune system. If caused by narrow nasal passages or nasal polyps, surgery may be needed. Follow these instructions at home: Medicines Take, use, or apply over-the-counter and prescription medicines only as told by your health care provider. These may include nasal sprays. If you were prescribed an antibiotic medicine, take it as told by your health care provider. Do not stop taking the antibiotic even if you start to feel better. Hydrate and humidify  Drink enough fluid to keep your urine pale yellow. Staying hydrated will help to thin your mucus. Use a cool mist humidifier to keep the humidity level in your home above 50%. Inhale steam for 10-15 minutes, 3-4 times a day, or as told by your health care provider. You can do this in the bathroom while a hot shower is running. Limit your exposure to cool or dry air. Rest Rest as much as possible. Sleep with your head raised (elevated). Make sure you get enough sleep each night. General instructions  Apply a warm, moist washcloth to your face 3-4 times a day or as told by your health care provider. This will help with discomfort. Use nasal saline washes as often as told  by your health care provider. Wash your hands often with soap and water to reduce your exposure to germs. If soap and water are not available, use hand sanitizer. Do not smoke. Avoid being around people who are smoking (secondhand smoke). Keep all follow-up visits. This is important. Contact a health care provider if: You have a fever. Your symptoms get worse. Your symptoms do not improve within 10 days. Get help right away if: You have a severe headache. You have persistent vomiting. You have severe pain or swelling around your face or eyes. You have vision problems. You develop confusion. Your neck is stiff. You have trouble breathing. These symptoms may be an emergency. Get help right away. Call 911. Do not wait to see if the  symptoms will go away. Do not drive yourself to the hospital. Summary A sinus infection is soreness and inflammation of your sinuses. Sinuses are hollow spaces in the bones around your face. This condition is caused by nasal tissues that become inflamed or swollen. The swelling traps or blocks the flow of mucus. This allows bacteria, viruses, and fungi to grow, which leads to infection. If you were prescribed an antibiotic medicine, take it as told by your health care provider. Do not stop taking the antibiotic even if you start to feel better. Keep all follow-up visits. This is important. This information is not intended to replace advice given to you by your health care provider. Make sure you discuss any questions you have with your health care provider. Document Revised: 02/12/2021 Document Reviewed: 02/12/2021 Elsevier Patient Education  2024 Elsevier Inc.    If you have been instructed to have an in-person evaluation today at a local Urgent Care facility, please use the link below. It will take you to a list of all of our available [facility], including address, phone number and hours of operation. Please do not delay care.  [facility]  If you or a family member do not have a primary care provider, use the link below to schedule a visit and establish care. When you choose a [facility] primary care physician or advanced practice provider, you gain a long-term partner in health. Find a Primary Care Provider  Learn more about [facility]'s in-office and virtual care options: [facility] - Get Care Now

## 2023-02-14 NOTE — Progress Notes (Signed)
Virtual Visit Consent   Gabriella Riley, you are scheduled for a virtual visit with a [facility] provider today. Just as with appointments in the office, your consent must be obtained to participate. Your consent will be active for this visit and any virtual visit you may have with one of our providers in the next 365 days. If you have a MyChart account, a copy of this consent can be sent to you electronically.  As this is a virtual visit, video technology does not allow for your provider to perform a traditional examination. This may limit your provider's ability to fully assess your condition. If your provider identifies any concerns that need to be evaluated in person or the need to arrange testing (such as labs, EKG, etc.), we will make arrangements to do so. Although advances in technology are sophisticated, we cannot ensure that it will always work on either your end or our end. If the connection with a video visit is poor, the visit may have to be switched to a telephone visit. With either a video or telephone visit, we are not always able to ensure that we have a secure connection.  By engaging in this virtual visit, you consent to the provision of healthcare and authorize for your insurance to be billed (if applicable) for the services provided during this visit. Depending on your insurance coverage, you may receive a charge related to this service.  I need to obtain your verbal consent now. Are you willing to proceed with your visit today? Gabriella Riley has provided verbal consent on 02/14/2023 for a virtual visit (video or telephone). Reed Pandy, [place]  Date: 02/14/2023 10:19 AM  Virtual Visit via Video Note   I, Reed Pandy, connected with  Gabriella Riley  ([id number], 02/03/1976) on 02/14/23 at 10:15 AM EST by a video-enabled telemedicine application and verified that I am speaking with the correct person using two identifiers.  Location: Patient: Virtual Visit Location  Patient: Home Provider: Virtual Visit Location Provider: Home Office   I discussed the limitations of evaluation and management by telemedicine and the availability of in person appointments. The patient expressed understanding and agreed to proceed.    History of Present Illness: Gabriella Riley is a 47 y.o. who identifies as a female who was assigned female at birth, and is being seen today for Pt states went to her doctors office on Thursday for facial pain, sinuses and throat pain.  Pt was told it was allergies.  Pt states she also had the chills.  Pt states that evening she began feeling weak.  Pt states her throat has white stuff and feels like she has a sinus infection and feels like she is swallowing razors.  Pt states taking allegra, Robitussin and Nyquil but nothing is working.   HPI: HPI  Problems:  Patient Active Problem List   Diagnosis Date Noted   Insomnia 08/11/2022   Prediabetes 12/19/2020   HLD (hyperlipidemia) 12/19/2020   Class 3 severe obesity due to excess calories with body mass index (BMI) of 40.0 to 44.9 in adult [facility]) 12/19/2020   Essential hypertension 10/04/2019   Arthritis of both knees 01/02/2015    Allergies:  Allergies  Allergen Reactions   Shellfish Allergy    Latex Rash   Medications:  Current Outpatient Medications:    amoxicillin-clavulanate (AUGMENTIN) 875-125 MG tablet, Take 1 tablet by mouth 2 (two) times daily for 10 days., Disp: 20 tablet, Rfl: 0  acetaminophen (TYLENOL) 500 MG tablet, Take 500-1,000 mg by mouth every 6 (six) hours as needed (for pain.)., Disp: , Rfl:    albuterol (VENTOLIN HFA) 108 (90 Base) MCG/ACT inhaler, Inhale 2 puffs into the lungs every 6 (six) hours as needed for wheezing or shortness of breath., Disp: 8 g, Rfl: 0   chlorthalidone (HYGROTON) 25 MG tablet, Take 1 tablet (25 mg total) by mouth daily., Disp: 90 tablet, Rfl: 1   famotidine (PEPCID) 20 MG tablet, Take 1 tablet (20 mg total) by mouth 2 (two) times daily  as needed for heartburn or indigestion., Disp: 60 tablet, Rfl: 5   fexofenadine (ALLEGRA ALLERGY) 180 MG tablet, Take 1 tablet (180 mg total) by mouth 2 (two) times daily as needed., Disp: 60 tablet, Rfl: 5   fluticasone (FLONASE) 50 MCG/ACT nasal spray, Place 2 sprays into both nostrils daily., Disp: 16 g, Rfl: 5   Multiple Vitamins-Minerals (ADULT GUMMY PO), Take 2 tablets by mouth daily., Disp: , Rfl:    simvastatin (ZOCOR) 20 MG tablet, Take 1 tablet (20 mg total) by mouth at bedtime., Disp: 90 tablet, Rfl: 1   traZODone (DESYREL) 50 MG tablet, TAKE 0.5-1 TABLET AT BEDTIME AS NEEDED FOR SLEEP., Disp: 90 tablet, Rfl: 1   Vitamin D, Ergocalciferol, (DRISDOL) 1.25 MG (50000 UNIT) CAPS capsule, Take 1 capsule (50,000 Units total) by mouth every 7 (seven) days., Disp: 12 capsule, Rfl: 0  Observations/Objective: Patient is well-developed, well-nourished in no acute distress.  Resting comfortably at home.  Head is normocephalic, atraumatic.  No labored breathing. Nasal congestion noted during televisit  Speech is clear and coherent with logical content.  Patient is alert and oriented at baseline.    Assessment and Plan: 1. Acute sinusitis, recurrence not specified, unspecified location - amoxicillin-clavulanate (AUGMENTIN) 875-125 MG tablet; Take 1 tablet by mouth 2 (two) times daily for 10 days.  Dispense: 20 tablet; Refill: 0  -Pt states she has Flonase and will start using.  Advised if symptoms persist to follow up with PCP or urgent care.   Follow Up Instructions: I discussed the assessment and treatment plan with the patient. The patient was provided an opportunity to ask questions and all were answered. The patient agreed with the plan and demonstrated an understanding of the instructions.  A copy of instructions were sent to the patient via MyChart unless otherwise noted below.     The patient was advised to call back or seek an in-person evaluation if the symptoms worsen or if the  condition fails to improve as anticipated.    Reed Pandy, PA-C

## 2023-02-16 MED ORDER — FLUCONAZOLE 150 MG PO TABS: 150.0000 mg | ORAL_TABLET | Freq: Once | ORAL | 0 refills | Status: AC

## 2023-02-16 NOTE — Addendum Note (Signed)
Addended by: Lorre Munroe on: 02/16/2023 11:22 AM   Modules accepted: Orders

## 2023-04-01 ENCOUNTER — Other Ambulatory Visit: Payer: Self-pay | Admitting: Internal Medicine

## 2023-04-01 ENCOUNTER — Encounter: Payer: Self-pay | Admitting: Internal Medicine

## 2023-04-01 DIAGNOSIS — Z1231 Encounter for screening mammogram for malignant neoplasm of breast: Secondary | ICD-10-CM

## 2023-04-02 ENCOUNTER — Telehealth (INDEPENDENT_AMBULATORY_CARE_PROVIDER_SITE_OTHER): Payer: Self-pay | Admitting: Internal Medicine

## 2023-04-02 ENCOUNTER — Encounter: Payer: Self-pay | Admitting: Internal Medicine

## 2023-04-02 DIAGNOSIS — I10 Essential (primary) hypertension: Secondary | ICD-10-CM

## 2023-04-02 DIAGNOSIS — E7800 Pure hypercholesterolemia, unspecified: Secondary | ICD-10-CM

## 2023-04-02 DIAGNOSIS — Z6841 Body Mass Index (BMI) 40.0 and over, adult: Secondary | ICD-10-CM

## 2023-04-02 DIAGNOSIS — R7303 Prediabetes: Secondary | ICD-10-CM

## 2023-04-02 DIAGNOSIS — E669 Obesity, unspecified: Secondary | ICD-10-CM

## 2023-04-02 DIAGNOSIS — E785 Hyperlipidemia, unspecified: Secondary | ICD-10-CM

## 2023-04-02 MED ORDER — SEMAGLUTIDE-WEIGHT MANAGEMENT 0.25 MG/0.5ML ~~LOC~~ SOAJ: 0.2500 mg | SUBCUTANEOUS | 0 refills | Status: DC

## 2023-04-02 NOTE — Patient Instructions (Signed)
Calorie Counting for Weight Loss Calories are units of energy. Your body needs a certain number of calories from food to keep going throughout the day. When you eat or drink more calories than your body needs, your body stores the extra calories mostly as fat. When you eat or drink fewer calories than your body needs, your body burns fat to get the energy it needs. Calorie counting means keeping track of how many calories you eat and drink each day. Calorie counting can be helpful if you need to lose weight. If you eat fewer calories than your body needs, you should lose weight. Ask your health care provider what a healthy weight is for you. For calorie counting to work, you will need to eat the right number of calories each day to lose a healthy amount of weight per week. A dietitian can help you figure out how many calories you need in a day and will suggest ways to reach your calorie goal. A healthy amount of weight to lose each week is usually 1-2 lb (0.5-0.9 kg). This usually means that your daily calorie intake should be reduced by 500-750 calories. Eating 1,200-1,500 calories a day can help most women lose weight. Eating 1,500-1,800 calories a day can help most men lose weight. What do I need to know about calorie counting? Work with your health care provider or dietitian to determine how many calories you should get each day. To meet your daily calorie goal, you will need to: Find out how many calories are in each food that you would like to eat. Try to do this before you eat. Decide how much of the food you plan to eat. Keep a food log. Do this by writing down what you ate and how many calories it had. To successfully lose weight, it is important to balance calorie counting with a healthy lifestyle that includes regular activity. Where do I find calorie information?  The number of calories in a food can be found on a Nutrition Facts label. If a food does not have a Nutrition Facts label, try  to look up the calories online or ask your dietitian for help. Remember that calories are listed per serving. If you choose to have more than one serving of a food, you will have to multiply the calories per serving by the number of servings you plan to eat. For example, the label on a package of bread might say that a serving size is 1 slice and that there are 90 calories in a serving. If you eat 1 slice, you will have eaten 90 calories. If you eat 2 slices, you will have eaten 180 calories. How do I keep a food log? After each time that you eat, record the following in your food log as soon as possible: What you ate. Be sure to include toppings, sauces, and other extras on the food. How much you ate. This can be measured in cups, ounces, or number of items. How many calories were in each food and drink. The total number of calories in the food you ate. Keep your food log near you, such as in a pocket-sized notebook or on an app or website on your mobile phone. Some programs will calculate calories for you and show you how many calories you have left to meet your daily goal. What are some portion-control tips? Know how many calories are in a serving. This will help you know how many servings you can have of a certain   food. Use a measuring cup to measure serving sizes. You could also try weighing out portions on a kitchen scale. With time, you will be able to estimate serving sizes for some foods. Take time to put servings of different foods on your favorite plates or in your favorite bowls and cups so you know what a serving looks like. Try not to eat straight from a food's packaging, such as from a bag or box. Eating straight from the package makes it hard to see how much you are eating and can lead to overeating. Put the amount you would like to eat in a cup or on a plate to make sure you are eating the right portion. Use smaller plates, glasses, and bowls for smaller portions and to prevent  overeating. Try not to multitask. For example, avoid watching TV or using your computer while eating. If it is time to eat, sit down at a table and enjoy your food. This will help you recognize when you are full. It will also help you be more mindful of what and how much you are eating. What are tips for following this plan? Reading food labels Check the calorie count compared with the serving size. The serving size may be smaller than what you are used to eating. Check the source of the calories. Try to choose foods that are high in protein, fiber, and vitamins, and low in saturated fat, trans fat, and sodium. Shopping Read nutrition labels while you shop. This will help you make healthy decisions about which foods to buy. Pay attention to nutrition labels for low-fat or fat-free foods. These foods sometimes have the same number of calories or more calories than the full-fat versions. They also often have added sugar, starch, or salt to make up for flavor that was removed with the fat. Make a grocery list of lower-calorie foods and stick to it. Cooking Try to cook your favorite foods in a healthier way. For example, try baking instead of frying. Use low-fat dairy products. Meal planning Use more fruits and vegetables. One-half of your plate should be fruits and vegetables. Include lean proteins, such as chicken, turkey, and fish. Lifestyle Each week, aim to do one of the following: 150 minutes of moderate exercise, such as walking. 75 minutes of vigorous exercise, such as running. General information Know how many calories are in the foods you eat most often. This will help you calculate calorie counts faster. Find a way of tracking calories that works for you. Get creative. Try different apps or programs if writing down calories does not work for you. What foods should I eat?  Eat nutritious foods. It is better to have a nutritious, high-calorie food, such as an avocado, than a food with  few nutrients, such as a bag of potato chips. Use your calories on foods and drinks that will fill you up and will not leave you hungry soon after eating. Examples of foods that fill you up are nuts and nut butters, vegetables, lean proteins, and high-fiber foods such as whole grains. High-fiber foods are foods with more than 5 g of fiber per serving. Pay attention to calories in drinks. Low-calorie drinks include water and unsweetened drinks. The items listed above may not be a complete list of foods and beverages you can eat. Contact a dietitian for more information. What foods should I limit? Limit foods or drinks that are not good sources of vitamins, minerals, or protein or that are high in unhealthy fats. These   include: Candy. Other sweets. Sodas, specialty coffee drinks, alcohol, and juice. The items listed above may not be a complete list of foods and beverages you should avoid. Contact a dietitian for more information. How do I count calories when eating out? Pay attention to portions. Often, portions are much larger when eating out. Try these tips to keep portions smaller: Consider sharing a meal instead of getting your own. If you get your own meal, eat only half of it. Before you start eating, ask for a container and put half of your meal into it. When available, consider ordering smaller portions from the menu instead of full portions. Pay attention to your food and drink choices. Knowing the way food is cooked and what is included with the meal can help you eat fewer calories. If calories are listed on the menu, choose the lower-calorie options. Choose dishes that include vegetables, fruits, whole grains, low-fat dairy products, and lean proteins. Choose items that are boiled, broiled, grilled, or steamed. Avoid items that are buttered, battered, fried, or served with cream sauce. Items labeled as crispy are usually fried, unless stated otherwise. Choose water, low-fat milk,  unsweetened iced tea, or other drinks without added sugar. If you want an alcoholic beverage, choose a lower-calorie option, such as a glass of wine or light beer. Ask for dressings, sauces, and syrups on the side. These are usually high in calories, so you should limit the amount you eat. If you want a salad, choose a garden salad and ask for grilled meats. Avoid extra toppings such as bacon, cheese, or fried items. Ask for the dressing on the side, or ask for olive oil and vinegar or lemon to use as dressing. Estimate how many servings of a food you are given. Knowing serving sizes will help you be aware of how much food you are eating at restaurants. Where to find more information Centers for Disease Control and Prevention: www.cdc.gov U.S. Department of Agriculture: myplate.gov Summary Calorie counting means keeping track of how many calories you eat and drink each day. If you eat fewer calories than your body needs, you should lose weight. A healthy amount of weight to lose per week is usually 1-2 lb (0.5-0.9 kg). This usually means reducing your daily calorie intake by 500-750 calories. The number of calories in a food can be found on a Nutrition Facts label. If a food does not have a Nutrition Facts label, try to look up the calories online or ask your dietitian for help. Use smaller plates, glasses, and bowls for smaller portions and to prevent overeating. Use your calories on foods and drinks that will fill you up and not leave you hungry shortly after a meal. This information is not intended to replace advice given to you by your health care provider. Make sure you discuss any questions you have with your health care provider. Document Revised: 04/21/2019 Document Reviewed: 04/21/2019 Elsevier Patient Education  2023 Elsevier Inc.  

## 2023-04-02 NOTE — Assessment & Plan Note (Signed)
 Encouraged diet and exercise for weight loss We will try [facility]

## 2023-04-02 NOTE — Progress Notes (Signed)
 Virtual Visit via Video Note  I connected with Gabriella Riley on 04/02/23 at 11:40 AM EST by a video enabled telemedicine application and verified that I am speaking with the correct person using two identifiers.  Location: Patient: At work Provider: Office  Persons participating in this video call: Angeline Laura, NP and Syliva Mace   I discussed the limitations of evaluation and management by telemedicine and the availability of in person appointments. The patient expressed understanding and agreed to proceed.  History of Present Illness:  Discussed the use of AI scribe software for clinical note transcription with the patient, who gave verbal consent to proceed.  The patient, with a history of high blood pressure, high cholesterol, and prediabetes, presents with concerns about her weight. She has a BMI of 43 and a current weight of 279 lbs. She is interested in starting Wegovy , a weight loss injection, and has researched its potential coverage by her insurance due to her BMI and other health conditions. She has no history or family history of thyroid  cancer. She has not previously tried any weight loss injections but has tried diet and exercise without significant results.      Past Medical History:  Diagnosis Date   Anemia    Angio-edema    Arthritis    Back pain    Bilateral swelling of feet and ankles    Constipation    Food allergy     GERD (gastroesophageal reflux disease)    History of blood clots    Hyperlipidemia    Hypertension    Joint pain    Lactose intolerance    Other fatigue    Shortness of breath    Shortness of breath on exertion    Syphilis 07/02/2022   Urticaria    Vitamin D  deficiency     Current Outpatient Medications  Medication Sig Dispense Refill   acetaminophen  (TYLENOL ) 500 MG tablet Take 500-1,000 mg by mouth every 6 (six) hours as needed (for pain.).     albuterol  (VENTOLIN  HFA) 108 (90 Base) MCG/ACT inhaler Inhale 2 puffs into the lungs  every 6 (six) hours as needed for wheezing or shortness of breath. 8 g 0   chlorthalidone  (HYGROTON ) 25 MG tablet Take 1 tablet (25 mg total) by mouth daily. 90 tablet 1   famotidine  (PEPCID ) 20 MG tablet Take 1 tablet (20 mg total) by mouth 2 (two) times daily as needed for heartburn or indigestion. 60 tablet 5   fexofenadine  (ALLEGRA  ALLERGY ) 180 MG tablet Take 1 tablet (180 mg total) by mouth 2 (two) times daily as needed. 60 tablet 5   fluticasone  (FLONASE ) 50 MCG/ACT nasal spray Place 2 sprays into both nostrils daily. 16 g 5   Multiple Vitamins-Minerals (ADULT GUMMY PO) Take 2 tablets by mouth daily.     simvastatin  (ZOCOR ) 20 MG tablet Take 1 tablet (20 mg total) by mouth at bedtime. 90 tablet 1   traZODone  (DESYREL ) 50 MG tablet TAKE 0.5-1 TABLET AT BEDTIME AS NEEDED FOR SLEEP. 90 tablet 1   Vitamin D , Ergocalciferol , (DRISDOL ) 1.25 MG (50000 UNIT) CAPS capsule Take 1 capsule (50,000 Units total) by mouth every 7 (seven) days. 12 capsule 0   No current facility-administered medications for this visit.    Allergies  Allergen Reactions   Shellfish Allergy     Latex Rash    Family History  Problem Relation Age of Onset   Allergic rhinitis Mother    Healthy Mother    Obesity Father  Sleep apnea Father    Heart disease Father    Diabetes Father    Hyperlipidemia Father    Hypertension Father    Heart failure Father    Healthy Sister    Prostate cancer Brother    Heart attack Brother    Arthritis Maternal Grandmother    Heart disease Other    Sleep apnea Other    Obesity Other    Colon cancer Neg Hx    Colon polyps Neg Hx    Esophageal cancer Neg Hx    Rectal cancer Neg Hx    Stomach cancer Neg Hx     Social History   Socioeconomic History   Marital status: Married    Spouse name: Will   Number of children: 4   Years of education: Not on file   Highest education level: Not on file  Occupational History   Occupation: Sport And Exercise Psychologist    Employer:  [employer]  Tobacco Use   Smoking status: Never    Passive exposure: Never   Smokeless tobacco: Never  Vaping Use   Vaping status: Never Used  Substance and Sexual Activity   Alcohol use: Yes    Alcohol/week: 0.0 standard drinks of alcohol    Comment: social   Drug use: No   Sexual activity: Yes    Birth control/protection: Surgical, I.U.D.    Comment: tubal ligation   Other Topics Concern   Not on file  Social History Narrative   Not on file   Social Drivers of Health   Financial Resource Strain: Low Risk  (02/12/2023)   Overall Financial Resource Strain (CARDIA)    Difficulty of Paying Living Expenses: Not hard at all  Food Insecurity: No Food Insecurity (02/12/2023)   Hunger Vital Sign    Worried About Running Out of Food in the Last Year: Never true    Ran Out of Food in the Last Year: Never true  Transportation Needs: No Transportation Needs (02/12/2023)   PRAPARE - Administrator, Civil Service (Medical): No    Lack of Transportation (Non-Medical): No  Physical Activity: Inactive (02/12/2023)   Exercise Vital Sign    Days of Exercise per Week: 0 days    Minutes of Exercise per Session: 0 min  Stress: Stress Concern Present (02/12/2023)   [employer] of Occupational Health - Occupational Stress Questionnaire    Feeling of Stress : To some extent  Social Connections: Socially Integrated (02/12/2023)   Social Connection and Isolation Panel [NHANES]    Frequency of Communication with Friends and Family: More than three times a week    Frequency of Social Gatherings with Friends and Family: More than three times a week    Attends Religious Services: More than 4 times per year    Active Member of [employer] or Organizations: Yes    Attends Engineer, Structural: More than 4 times per year    Marital Status: Married  Catering Manager Violence: Not At Risk (02/12/2023)   Humiliation, Afraid, Rape, and Kick questionnaire    Fear of  Current or Ex-Partner: No    Emotionally Abused: No    Physically Abused: No    Sexually Abused: No     Constitutional: Denies fever, malaise, fatigue, headache or abrupt weight changes.  HEENT: Denies eye pain, eye redness, ear pain, ringing in the ears, wax buildup, runny nose, nasal congestion, bloody nose, or sore throat. Respiratory: Denies difficulty breathing, shortness of breath, cough or sputum production.  Cardiovascular: Denies chest pain, chest tightness, palpitations or swelling in the hands or feet.  Gastrointestinal: Denies abdominal pain, bloating, constipation, diarrhea or blood in the stool.  GU: Denies urgency, frequency, pain with urination, burning sensation, blood in urine, odor or discharge. Musculoskeletal: Patient reports joint pain.  Denies decrease in range of motion, difficulty with gait, muscle pain or joint swelling.  Skin: Denies redness, rashes, lesions or ulcercations.  Neurological: Patient reports insomnia.  Denies dizziness, difficulty with memory, difficulty with speech or problems with balance and coordination.  Psych: Denies anxiety, depression, SI/HI.  No other specific complaints in a complete review of systems (except as listed in HPI above).    Observations/Objective:  Wt Readings from Last 3 Encounters:  02/12/23 278 lb (126.1 kg)  12/02/22 280 lb (127 kg)  08/11/22 281 lb (127.5 kg)    General: Appears her stated age, obese, in NAD. Pulmonary/Chest: Normal effort. No respiratory distress.  Neurological: Alert and oriented.   BMET    Component Value Date/Time   NA 138 02/12/2023 1459   NA 143 06/17/2022 1113   K 3.7 02/12/2023 1459   CL 102 02/12/2023 1459   CO2 28 02/12/2023 1459   GLUCOSE 66 02/12/2023 1459   BUN 8 02/12/2023 1459   BUN 11 06/17/2022 1113   CREATININE 0.90 02/12/2023 1459   CALCIUM  9.0 02/12/2023 1459   GFRNONAA 80 09/28/2017 1005   GFRAA 92 09/28/2017 1005    Lipid Panel     Component Value Date/Time    CHOL 186 02/12/2023 1459   CHOL 186 09/20/2020 0844   TRIG 51 02/12/2023 1459   HDL 49 (L) 02/12/2023 1459   HDL 47 09/20/2020 0844   CHOLHDL 3.8 02/12/2023 1459   LDLCALC 123 (H) 02/12/2023 1459    CBC    Component Value Date/Time   WBC 11.9 (H) 02/12/2023 1459   RBC 5.15 (H) 02/12/2023 1459   HGB 12.2 02/12/2023 1459   HGB 12.8 06/17/2022 1113   HCT 38.9 02/12/2023 1459   HCT 42.3 06/17/2022 1113   PLT 255 02/12/2023 1459   PLT 286 06/17/2022 1113   MCV 75.5 (L) 02/12/2023 1459   MCV 77 (L) 06/17/2022 1113   MCH 23.7 (L) 02/12/2023 1459   MCHC 31.4 (L) 02/12/2023 1459   RDW 13.8 02/12/2023 1459   RDW 14.4 06/17/2022 1113   LYMPHSABS 2.0 06/17/2022 1113   EOSABS 0.2 06/17/2022 1113   BASOSABS 0.0 06/17/2022 1113    Hgb A1C Lab Results  Component Value Date   HGBA1C 5.8 (H) 02/12/2023        Assessment and Plan:  Assessment and Plan    Obesity BMI of 43 with comorbidities of hypertension, hyperlipidemia, and prediabetes. Discussed the use of Wegovy  (semaglutide ) for weight loss. Patient interested in this treatment, but insurance coverage is uncertain. Discussed the possibility of using a compounded version of the medication if insurance does not cover the brand name. -Submit prior authorization request to [facility] for Wegovy . -If approved, start Wegovy  at 0.25mg  weekly for 4 weeks, then increase to 0.5mg  weekly. -If not approved, discuss the option of compounded semaglutide  with the patient.   RTC in 4 months for follow-up of chronic conditions  Follow Up Instructions:    I discussed the assessment and treatment plan with the patient. The patient was provided an opportunity to ask questions and all were answered. The patient agreed with the plan and demonstrated an understanding of the instructions.   The patient was advised to  call back or seek an in-person evaluation if the symptoms worsen or if the condition fails to improve as anticipated.    Angeline Laura, NP

## 2023-04-22 ENCOUNTER — Other Ambulatory Visit: Payer: Self-pay | Admitting: Internal Medicine

## 2023-04-22 DIAGNOSIS — I10 Essential (primary) hypertension: Secondary | ICD-10-CM

## 2023-04-23 NOTE — Telephone Encounter (Signed)
Requested Prescriptions  Pending Prescriptions Disp Refills   chlorthalidone (HYGROTON) 25 MG tablet [Pharmacy Med Name: CHLORTHALIDONE 25 MG TABLET] 90 tablet 1    Sig: TAKE 1 TABLET (25 MG TOTAL) BY MOUTH DAILY.     Cardiovascular: Diuretics - Thiazide Failed - 04/23/2023  1:57 PM      Failed - Last BP in normal range    BP Readings from Last 1 Encounters:  02/12/23 (!) 140/80         Passed - Cr in normal range and within 180 days    Creat  Date Value Ref Range Status  02/12/2023 0.90 0.50 - 0.99 mg/dL Final         Passed - K in normal range and within 180 days    Potassium  Date Value Ref Range Status  02/12/2023 3.7 3.5 - 5.3 mmol/L Final         Passed - Na in normal range and within 180 days    Sodium  Date Value Ref Range Status  02/12/2023 138 135 - 146 mmol/L Final  06/17/2022 143 134 - 144 mmol/L Final         Passed - Valid encounter within last 6 months    Recent Outpatient Visits           3 weeks ago Class 3 severe obesity due to excess calories with serious comorbidity and body mass index (BMI) of 40.0 to 44.9 in adult [facility])   [facility] [place], Salvadore Oxford, NP   2 months ago Encounter for general adult medical examination with abnormal findings   [place] [facility] [place], [place] W, NP   4 months ago Class 3 severe obesity due to excess calories with serious comorbidity and body mass index (BMI) of 40.0 to 44.9 in adult [facility])   [place] [facility] [place], Salvadore Oxford, NP   8 months ago Arthritis of both knees   [place] [facility] [place], Salvadore Oxford, NP   1 year ago Encounter for general adult medical examination with abnormal findings   [place] [facility] [place], Salvadore Oxford, NP       Future Appointments             In 3 months Baity, Salvadore Oxford, NP [facility], [facility]

## 2023-05-04 ENCOUNTER — Ambulatory Visit
Admission: RE | Admit: 2023-05-04 | Discharge: 2023-05-04 | Disposition: A | Discharge status: Home / Self Care | Payer: 59 | Source: Ambulatory Visit | Attending: Internal Medicine | Admitting: Internal Medicine

## 2023-05-04 DIAGNOSIS — Z1231 Encounter for screening mammogram for malignant neoplasm of breast: Secondary | ICD-10-CM

## 2023-05-05 ENCOUNTER — Other Ambulatory Visit: Payer: Self-pay | Admitting: Internal Medicine

## 2023-05-05 DIAGNOSIS — E559 Vitamin D deficiency, unspecified: Secondary | ICD-10-CM

## 2023-05-05 NOTE — Telephone Encounter (Signed)
Requested medication (s) are due for refill today: yes  Requested medication (s) are on the active medication list: yes  Last refill:  02/12/23 #12/0  Future visit scheduled: yes  Notes to clinic:  no recent Vit D level, Unable to refill per protocol, cannot delegate.      Requested Prescriptions  Pending Prescriptions Disp Refills   Vitamin D, Ergocalciferol, (DRISDOL) 1.25 MG (50000 UNIT) CAPS capsule [Pharmacy Med Name: VITAMIN D2 1.25MG (50,000 UNIT)] 12 capsule 0    Sig: Take 1 capsule (50,000 Units total) by mouth every 7 (seven) days.     Endocrinology:  Vitamins - Vitamin D Supplementation 2 Failed - 05/05/2023  4:36 PM      Failed - Manual Review: Route requests for 50,000 IU strength to the provider      Failed - Vitamin D in normal range and within 360 days    Vit D, 25-Hydroxy  Date Value Ref Range Status  09/20/2020 16.9 (L) 30.0 - 100.0 ng/mL Final    Comment:    Vitamin D deficiency has been defined by the Institute of Medicine and an Endocrine Society practice guideline as a level of serum 25-OH vitamin D less than 20 ng/mL (1,2). The Endocrine Society went on to further define vitamin D insufficiency as a level between 21 and 29 ng/mL (2). 1. IOM (Institute of Medicine). 2010. Dietary reference    intakes for calcium and D. [place]: The    [employer]. 2. Holick MF, Binkley [place], Bischoff-Ferrari HA, et al.    Evaluation, treatment, and prevention of vitamin D    deficiency: an Endocrine Society clinical practice    guideline. JCEM. 2011 Jul; 96(7):1911-30.          Passed - Ca in normal range and within 360 days    Calcium  Date Value Ref Range Status  02/12/2023 9.0 8.6 - 10.2 mg/dL Final   Calcium, Ion  Date Value Ref Range Status  12/05/2008 1.19 1.12 - 1.32 mmol/L Final         Passed - Valid encounter within last 12 months    Recent Outpatient Visits           1 month ago Class 3 severe obesity due to excess calories with  serious comorbidity and body mass index (BMI) of 40.0 to 44.9 in adult [facility])   [place] [facility] [place], Salvadore Oxford, NP   2 months ago Encounter for general adult medical examination with abnormal findings   [place] [facility] [place], [place] W, NP   5 months ago Class 3 severe obesity due to excess calories with serious comorbidity and body mass index (BMI) of 40.0 to 44.9 in adult [facility])   [place] [facility] [place], Salvadore Oxford, NP   8 months ago Arthritis of both knees   [place] [facility] [place], Salvadore Oxford, NP   1 year ago Encounter for general adult medical examination with abnormal findings   [place] [facility] [place], Salvadore Oxford, NP       Future Appointments             In 3 months Baity, Salvadore Oxford, NP  [facility], [facility]

## 2023-08-05 ENCOUNTER — Telehealth: Admitting: Physician Assistant

## 2023-08-05 DIAGNOSIS — M545 Low back pain, unspecified: Secondary | ICD-10-CM | POA: Diagnosis not present

## 2023-08-05 DIAGNOSIS — K140 Glossitis: Secondary | ICD-10-CM

## 2023-08-05 MED ORDER — MELOXICAM 7.5 MG PO TABS: 7.5000 mg | ORAL_TABLET | Freq: Every day | ORAL | 0 refills | Status: DC

## 2023-08-05 MED ORDER — CHLORHEXIDINE GLUCONATE 0.12 % MT SOLN: 15.0000 mL | Freq: Two times a day (BID) | OROMUCOSAL | 0 refills | Status: DC

## 2023-08-05 NOTE — Patient Instructions (Signed)
 Gabriella Riley, thank you for joining Hyla Maillard, PA-C for today's virtual visit.  While this provider is not your primary care provider (PCP), if your PCP is located in our provider database this encounter information will be shared with them immediately following your visit.   A [facility] MyChart account gives you access to today's visit and all your visits, tests, and labs performed at [facility] " click here if you don't have a [facility] MyChart account or go to [email or web address]  Consent: (Patient) Gabriella Riley provided verbal consent for this virtual visit at the beginning of the encounter.  Current Medications:  Current Outpatient Medications:    acetaminophen  (TYLENOL ) 500 MG tablet, Take 500-1,000 mg by mouth every 6 (six) hours as needed (for pain.)., Disp: , Rfl:    albuterol  (VENTOLIN  HFA) 108 (90 Base) MCG/ACT inhaler, Inhale 2 puffs into the lungs every 6 (six) hours as needed for wheezing or shortness of breath., Disp: 8 g, Rfl: 0   chlorthalidone  (HYGROTON ) 25 MG tablet, TAKE 1 TABLET (25 MG TOTAL) BY MOUTH DAILY., Disp: 90 tablet, Rfl: 1   famotidine  (PEPCID ) 20 MG tablet, Take 1 tablet (20 mg total) by mouth 2 (two) times daily as needed for heartburn or indigestion., Disp: 60 tablet, Rfl: 5   fexofenadine  (ALLEGRA  ALLERGY ) 180 MG tablet, Take 1 tablet (180 mg total) by mouth 2 (two) times daily as needed., Disp: 60 tablet, Rfl: 5   fluticasone  (FLONASE ) 50 MCG/ACT nasal spray, Place 2 sprays into both nostrils daily., Disp: 16 g, Rfl: 5   Multiple Vitamins-Minerals (ADULT GUMMY PO), Take 2 tablets by mouth daily., Disp: , Rfl:    Semaglutide -Weight Management 0.25 MG/0.5ML SOAJ, Inject 0.25 mg into the skin once a week., Disp: 2 mL, Rfl: 0   simvastatin  (ZOCOR ) 20 MG tablet, Take 1 tablet (20 mg total) by mouth at bedtime., Disp: 90 tablet, Rfl: 1   traZODone  (DESYREL ) 50 MG tablet, TAKE 0.5-1 TABLET AT BEDTIME AS NEEDED FOR SLEEP.,  Disp: 90 tablet, Rfl: 1   Vitamin D , Ergocalciferol , (DRISDOL ) 1.25 MG (50000 UNIT) CAPS capsule, TAKE 1 CAPSULE (50,000 UNITS TOTAL) BY MOUTH EVERY 7 (SEVEN) DAYS, Disp: 12 capsule, Rfl: 0   Medications ordered in this encounter:  No orders of the defined types were placed in this encounter.    *If you need refills on other medications prior to your next appointment, please contact your pharmacy*  Follow-Up: Call back or seek an in-person evaluation if the symptoms worsen or if the condition fails to improve as anticipated.  [facility] [phone]  Other Instructions Please avoid heavy lifting or over exertion. Use heating pad as discussed. Ok to continue OTC tylenol . Take the Meloxicam  as directed with food.   For the mouth, use the Peridex. Continue good oral hygiene. If symptoms are not resolving over the rest of the week, I want you to seek an in-person evaluation.   If you have been instructed to have an in-person evaluation today at a local Urgent Care facility, please use the link below. It will take you to a list of all of our available [facility], including address, phone number and hours of operation. Please do not delay care.  [facility]  If you or a family member do not have a primary care provider, use the link below to schedule a visit and establish care. When you choose a [facility] primary care physician or  advanced practice provider, you gain a long-term partner in health. Find a Primary Care Provider  Learn more about [facility] in-office and virtual care options: [facility] - Get Care Now

## 2023-08-05 NOTE — Progress Notes (Signed)
 Virtual Visit Consent   Gabriella Riley, you are scheduled for a virtual visit with a [facility] provider today. Just as with appointments in the office, your consent must be obtained to participate. Your consent will be active for this visit and any virtual visit you may have with one of our providers in the next 365 days. If you have a MyChart account, a copy of this consent can be sent to you electronically.  As this is a virtual visit, video technology does not allow for your provider to perform a traditional examination. This may limit your provider's ability to fully assess your condition. If your provider identifies any concerns that need to be evaluated in person or the need to arrange testing (such as labs, EKG, etc.), we will make arrangements to do so. Although advances in technology are sophisticated, we cannot ensure that it will always work on either your end or our end. If the connection with a video visit is poor, the visit may have to be switched to a telephone visit. With either a video or telephone visit, we are not always able to ensure that we have a secure connection.  By engaging in this virtual visit, you consent to the provision of healthcare and authorize for your insurance to be billed (if applicable) for the services provided during this visit. Depending on your insurance coverage, you may receive a charge related to this service.  I need to obtain your verbal consent now. Are you willing to proceed with your visit today? Gabriella Riley has provided verbal consent on 08/05/2023 for a virtual visit (video or telephone). Gabriella Riley, [place]  Date: 08/05/2023 10:27 AM   Virtual Visit via Video Note   I, Gabriella Riley, connected with  Gabriella Riley  ([id number], 12-Mar-1976) on 08/05/23 at 10:00 AM EDT by a video-enabled telemedicine application and verified that I am speaking with the correct person using two identifiers.  Location: Patient: Virtual  Visit Location Patient: Home Provider: Virtual Visit Location Provider: Home Office   I discussed the limitations of evaluation and management by telemedicine and the availability of in person appointments. The patient expressed understanding and agreed to proceed.    History of Present Illness: Gabriella Riley is a 48 y.o. who identifies as a female who was assigned female at birth, and is being seen today for multiple concerns  Tongue -- notes some pain of her tongue over the past week. Notes she has not been staying the most hydrated. Notes some irritation and swelling of her taste buds with whitish discoloration. Denies sore throat or odynophagia. Tip of tongue and side affected only. Denies recent dietary changes. Denies recent antibiotic or steroid use. Notes felt under weather last week but this resolved.  Patient also complaining of left-sided low back pain over past couple of days after having to sleep on a hard bench in the hospital (son was admitted). Notes left-sided low back pain sometimes down into thigh. Denies ongoing numbness. Denies weakness. Has taken Tylenol  with some relief. Pain is worse in certain positions or going from a seated to standing position.   HPI: HPI  Problems:  Patient Active Problem List   Diagnosis Date Noted   Insomnia 08/11/2022   Prediabetes 12/19/2020   HLD (hyperlipidemia) 12/19/2020   Class 3 severe obesity due to excess calories with body mass index (BMI) of 40.0 to 44.9 in adult 12/19/2020   Essential hypertension 10/04/2019   Arthritis  of both knees 01/02/2015    Allergies:  Allergies  Allergen Reactions   Shellfish Allergy     Latex Rash   Medications:  Current Outpatient Medications:    chlorhexidine (PERIDEX) 0.12 % solution, Use as directed 15 mLs in the mouth or throat 2 (two) times daily., Disp: 120 mL, Rfl: 0   meloxicam  (MOBIC ) 7.5 MG tablet, Take 1 tablet (7.5 mg total) by mouth daily., Disp: 15 tablet, Rfl: 0   acetaminophen   (TYLENOL ) 500 MG tablet, Take 500-1,000 mg by mouth every 6 (six) hours as needed (for pain.)., Disp: , Rfl:    albuterol  (VENTOLIN  HFA) 108 (90 Base) MCG/ACT inhaler, Inhale 2 puffs into the lungs every 6 (six) hours as needed for wheezing or shortness of breath., Disp: 8 g, Rfl: 0   chlorthalidone  (HYGROTON ) 25 MG tablet, TAKE 1 TABLET (25 MG TOTAL) BY MOUTH DAILY., Disp: 90 tablet, Rfl: 1   famotidine  (PEPCID ) 20 MG tablet, Take 1 tablet (20 mg total) by mouth 2 (two) times daily as needed for heartburn or indigestion., Disp: 60 tablet, Rfl: 5   fexofenadine  (ALLEGRA  ALLERGY ) 180 MG tablet, Take 1 tablet (180 mg total) by mouth 2 (two) times daily as needed., Disp: 60 tablet, Rfl: 5   fluticasone  (FLONASE ) 50 MCG/ACT nasal spray, Place 2 sprays into both nostrils daily., Disp: 16 g, Rfl: 5   Multiple Vitamins-Minerals (ADULT GUMMY PO), Take 2 tablets by mouth daily., Disp: , Rfl:    simvastatin  (ZOCOR ) 20 MG tablet, Take 1 tablet (20 mg total) by mouth at bedtime., Disp: 90 tablet, Rfl: 1   traZODone  (DESYREL ) 50 MG tablet, TAKE 0.5-1 TABLET AT BEDTIME AS NEEDED FOR SLEEP., Disp: 90 tablet, Rfl: 1   Vitamin D , Ergocalciferol , (DRISDOL ) 1.25 MG (50000 UNIT) CAPS capsule, TAKE 1 CAPSULE (50,000 UNITS TOTAL) BY MOUTH EVERY 7 (SEVEN) DAYS, Disp: 12 capsule, Rfl: 0  Observations/Objective: Patient is well-developed, well-nourished in no acute distress.  Resting comfortably at home.  Head is normocephalic, atraumatic.  No labored breathing. Speech is clear and coherent with logical content.  Patient is alert and oriented at baseline.  Mild swelling and whitish discoloration of the lingual papilla of anterior tongue, and sides. No ulceration or evidence of thrush present. Uvula midline and without swelling or lesion. No posterior oropharyngeal erythema noted.  Assessment and Plan: 1. Transient lingual papillitis (Primary) - chlorhexidine (PERIDEX) 0.12 % solution; Use as directed 15 mLs in the  mouth or throat 2 (two) times daily.  Dispense: 120 mL; Refill: 0  Supportive measures and OTC medications reviewed. Peridex per orders. Follow-up in person if not resolving.   2. Acute left-sided low back pain without sciatica - meloxicam  (MOBIC ) 7.5 MG tablet; Take 1 tablet (7.5 mg total) by mouth daily.  Dispense: 15 tablet; Refill: 0  No alarm signs or symptoms. Atraumatic. Supportive measures such as heating pad and gentle stretching discussed. OTC medications reviewed. Meloxicam  per orders.   Follow Up Instructions: I discussed the assessment and treatment plan with the patient. The patient was provided an opportunity to ask questions and all were answered. The patient agreed with the plan and demonstrated an understanding of the instructions.  A copy of instructions were sent to the patient via MyChart unless otherwise noted below.   The patient was advised to call back or seek an in-person evaluation if the symptoms worsen or if the condition fails to improve as anticipated.    Gabriella Maillard, PA-C

## 2023-08-10 ENCOUNTER — Ambulatory Visit: Payer: Self-pay | Admitting: Internal Medicine

## 2023-08-10 ENCOUNTER — Encounter: Payer: Self-pay | Admitting: Internal Medicine

## 2023-08-10 VITALS — BP 138/84 | Ht 67.0 in | Wt 279.8 lb

## 2023-08-10 DIAGNOSIS — R7303 Prediabetes: Secondary | ICD-10-CM

## 2023-08-10 DIAGNOSIS — E66813 Obesity, class 3: Secondary | ICD-10-CM

## 2023-08-10 DIAGNOSIS — L309 Dermatitis, unspecified: Secondary | ICD-10-CM | POA: Insufficient documentation

## 2023-08-10 DIAGNOSIS — E7800 Pure hypercholesterolemia, unspecified: Secondary | ICD-10-CM

## 2023-08-10 DIAGNOSIS — Z6841 Body Mass Index (BMI) 40.0 and over, adult: Secondary | ICD-10-CM

## 2023-08-10 DIAGNOSIS — M170 Bilateral primary osteoarthritis of knee: Secondary | ICD-10-CM

## 2023-08-10 DIAGNOSIS — L308 Other specified dermatitis: Secondary | ICD-10-CM | POA: Diagnosis not present

## 2023-08-10 DIAGNOSIS — K219 Gastro-esophageal reflux disease without esophagitis: Secondary | ICD-10-CM | POA: Insufficient documentation

## 2023-08-10 DIAGNOSIS — I10 Essential (primary) hypertension: Secondary | ICD-10-CM

## 2023-08-10 DIAGNOSIS — F5101 Primary insomnia: Secondary | ICD-10-CM

## 2023-08-10 NOTE — Assessment & Plan Note (Signed)
 Encouraged diet and exercise for weight loss ?

## 2023-08-10 NOTE — Assessment & Plan Note (Signed)
 VMET and lipid profile today Encourage low-fat diet and exercise for weight loss Continue simvastatin 

## 2023-08-10 NOTE — Assessment & Plan Note (Signed)
 Try hydrocortisone cream OTC prn

## 2023-08-10 NOTE — Assessment & Plan Note (Signed)
Encourage weight loss as this can reduce joint pain

## 2023-08-10 NOTE — Patient Instructions (Signed)

## 2023-08-10 NOTE — Assessment & Plan Note (Signed)
 Avoid foods that trigger your reflux Encouraged weight loss as this can help reduce reflux symptoms Continue famotidine 

## 2023-08-10 NOTE — Assessment & Plan Note (Signed)
 Reinforced DASH diet and exercise for weight loss Continue chlorthalidone  CMET today

## 2023-08-10 NOTE — Assessment & Plan Note (Signed)
 Continue trazodone  Consider sleep study if symptoms persist

## 2023-08-10 NOTE — Assessment & Plan Note (Signed)
 Encourage low-carb diet and exercise for weight loss A1C today

## 2023-08-10 NOTE — Progress Notes (Signed)
 Subjective:    Patient ID: Gabriella Riley, female    DOB: September 08, 1975, 48 y.o.   MRN: [id number]  HPI  Patient presents to clinic today for 68-month follow-up of chronic conditions.  HTN: Her BP today is 138/84.  She is taking chlorthalidone  as prescribed.  ECG from 08/2020 reviewed.  HLD: Her last LDL was 123, triglycerides 51, 01/2023.  She denies myalgias on simvastatin .  She has been trying to consume a low-fat diet.  Prediabetes: Her last A1c was 5.8%, 01/2023.  She is not taking any oral diabetic medications time.  She does not check her sugars.  OA: Mainly in her knees.  She takes tylenol  as needed but she has been taking meloxicam  for the last week with some relief of symptoms.  She is not currently following with orthopedics.  GERD: Triggered by tomato based foods, spicy foods.  She denies breakthrough on famotidine .  There is no upper GI on file.  Insomnia: She has trouble falling asleep and staying asleep. She is taking trazodone  as prescribed. There is no sleep study on file.   Review of Systems     Past Medical History:  Diagnosis Date   Anemia    Angio-edema    Arthritis    Back pain    Bilateral swelling of feet and ankles    Constipation    Food allergy     GERD (gastroesophageal reflux disease)    History of blood clots    Hyperlipidemia    Hypertension    Joint pain    Lactose intolerance    Other fatigue    Shortness of breath    Shortness of breath on exertion    Syphilis 07/02/2022   Urticaria    Vitamin D  deficiency     Current Outpatient Medications  Medication Sig Dispense Refill   acetaminophen  (TYLENOL ) 500 MG tablet Take 500-1,000 mg by mouth every 6 (six) hours as needed (for pain.).     albuterol  (VENTOLIN  HFA) 108 (90 Base) MCG/ACT inhaler Inhale 2 puffs into the lungs every 6 (six) hours as needed for wheezing or shortness of breath. 8 g 0   chlorhexidine  (PERIDEX ) 0.12 % solution Use as directed 15 mLs in the mouth or throat 2 (two)  times daily. 120 mL 0   chlorthalidone  (HYGROTON ) 25 MG tablet TAKE 1 TABLET (25 MG TOTAL) BY MOUTH DAILY. 90 tablet 1   famotidine  (PEPCID ) 20 MG tablet Take 1 tablet (20 mg total) by mouth 2 (two) times daily as needed for heartburn or indigestion. 60 tablet 5   fexofenadine  (ALLEGRA  ALLERGY ) 180 MG tablet Take 1 tablet (180 mg total) by mouth 2 (two) times daily as needed. 60 tablet 5   fluticasone  (FLONASE ) 50 MCG/ACT nasal spray Place 2 sprays into both nostrils daily. 16 g 5   meloxicam  (MOBIC ) 7.5 MG tablet Take 1 tablet (7.5 mg total) by mouth daily. 15 tablet 0   Multiple Vitamins-Minerals (ADULT GUMMY PO) Take 2 tablets by mouth daily.     simvastatin  (ZOCOR ) 20 MG tablet Take 1 tablet (20 mg total) by mouth at bedtime. 90 tablet 1   traZODone  (DESYREL ) 50 MG tablet TAKE 0.5-1 TABLET AT BEDTIME AS NEEDED FOR SLEEP. 90 tablet 1   Vitamin D , Ergocalciferol , (DRISDOL ) 1.25 MG (50000 UNIT) CAPS capsule TAKE 1 CAPSULE (50,000 UNITS TOTAL) BY MOUTH EVERY 7 (SEVEN) DAYS 12 capsule 0   No current facility-administered medications for this visit.    Allergies  Allergen Reactions  Shellfish Allergy     Latex Rash    Family History  Problem Relation Age of Onset   Allergic rhinitis Mother    Healthy Mother    Obesity Father    Sleep apnea Father    Heart disease Father    Diabetes Father    Hyperlipidemia Father    Hypertension Father    Heart failure Father    Healthy Sister    Prostate cancer Brother    Heart attack Brother    Arthritis Maternal Grandmother    Heart disease Other    Sleep apnea Other    Obesity Other    Colon cancer Neg Hx    Colon polyps Neg Hx    Esophageal cancer Neg Hx    Rectal cancer Neg Hx    Stomach cancer Neg Hx     Social History   Socioeconomic History   Marital status: Married    Spouse name: Will   Number of children: 4   Years of education: Not on file   Highest education level: Not on file  Occupational History   Occupation:  Sport and exercise psychologist    Employer: [employer]  Tobacco Use   Smoking status: Never    Passive exposure: Never   Smokeless tobacco: Never  Vaping Use   Vaping status: Never Used  Substance and Sexual Activity   Alcohol use: Yes    Alcohol/week: 0.0 standard drinks of alcohol    Comment: social   Drug use: No   Sexual activity: Yes    Birth control/protection: Surgical, I.U.D.    Comment: tubal ligation   Other Topics Concern   Not on file  Social History Narrative   Not on file   Social Drivers of Health   Financial Resource Strain: Low Risk  (02/12/2023)   Overall Financial Resource Strain (CARDIA)    Difficulty of Paying Living Expenses: Not hard at all  Food Insecurity: No Food Insecurity (02/12/2023)   Hunger Vital Sign    Worried About Running Out of Food in the Last Year: Never true    Ran Out of Food in the Last Year: Never true  Transportation Needs: No Transportation Needs (02/12/2023)   PRAPARE - Administrator, Civil Service (Medical): No    Lack of Transportation (Non-Medical): No  Physical Activity: Inactive (02/12/2023)   Exercise Vital Sign    Days of Exercise per Week: 0 days    Minutes of Exercise per Session: 0 min  Stress: Stress Concern Present (02/12/2023)   [employer] of Occupational Health - Occupational Stress Questionnaire    Feeling of Stress : To some extent  Social Connections: Socially Integrated (02/12/2023)   Social Connection and Isolation Panel [NHANES]    Frequency of Communication with Friends and Family: More than three times a week    Frequency of Social Gatherings with Friends and Family: More than three times a week    Attends Religious Services: More than 4 times per year    Active Member of [employer] or Organizations: Yes    Attends Banker Meetings: More than 4 times per year    Marital Status: Married  Catering manager Violence: Not At Risk (02/12/2023)   Humiliation, Afraid,  Rape, and Kick questionnaire    Fear of Current or Ex-Partner: No    Emotionally Abused: No    Physically Abused: No    Sexually Abused: No     Constitutional: Pt reports difficulty losing weight. Denies fever, malaise,  fatigue, headache.  HEENT: Denies eye pain, eye redness, ear pain, ringing in the ears, wax buildup, runny nose, nasal congestion, bloody nose, or sore throat. Respiratory: Denies difficulty breathing, shortness of breath, cough or sputum production.   Cardiovascular: Denies chest pain, chest tightness, palpitations or swelling in the hands or feet.  Gastrointestinal: Denies abdominal pain, bloating, constipation, diarrhea or blood in the stool.  GU: Denies urgency, frequency, pain with urination, burning sensation, blood in urine, odor or discharge. Musculoskeletal: Patient reports knee pain, recent episode of back pain.  Denies decrease in range of motion, difficulty with gait, or joint swelling.  Skin: Pt reports rash of arms. Denies redness, rashes, lesions or ulcercations.  Neurological: Pt reports insomnia, difficulty with concentration. Denies dizziness, difficulty with memory, difficulty with speech or problems with balance and coordination.  Psych: Denies anxiety, depression, SI/HI.  No other specific complaints in a complete review of systems (except as listed in HPI above).  Objective:   Physical Exam BP 138/84 (BP Location: Left Arm, Patient Position: Sitting, Cuff Size: Large)   Ht 5\' 7"  (1.702 m)   Wt 279 lb 12.8 oz (126.9 kg)   BMI 43.82 kg/m    Wt Readings from Last 3 Encounters:  02/12/23 278 lb (126.1 kg)  12/02/22 280 lb (127 kg)  08/11/22 281 lb (127.5 kg)    General: Appears her stated age, obese, in NAD. Skin: Warm, dry and intact. Scaly rash noted of left forearm.  HEENT: Head: normal shape and size; Eyes: sclera white, no icterus, conjunctiva pink, PERRLA and EOMs intact;  Cardiovascular: Normal rate and rhythm. S1,S2 noted.  No murmur,  rubs or gallops noted. No JVD or BLE edema. Pulmonary/Chest: Normal effort and positive vesicular breath sounds. No respiratory distress. No wheezes, rales or ronchi noted.  Abdomen:  Normal bowel sounds.  MSK: Joint enlargement of bilateral knees. No difficulty with gait. Neurological: Alert and oriented. Coordination normal.  Psych: Mood and affect normal. Behavior and thought content normal. Judgement and thought content normal.     BMET    Component Value Date/Time   NA 138 02/12/2023 1459   NA 143 06/17/2022 1113   K 3.7 02/12/2023 1459   CL 102 02/12/2023 1459   CO2 28 02/12/2023 1459   GLUCOSE 66 02/12/2023 1459   BUN 8 02/12/2023 1459   BUN 11 06/17/2022 1113   CREATININE 0.90 02/12/2023 1459   CALCIUM  9.0 02/12/2023 1459   GFRNONAA 80 09/28/2017 1005   GFRAA 92 09/28/2017 1005    Lipid Panel     Component Value Date/Time   CHOL 186 02/12/2023 1459   CHOL 186 09/20/2020 0844   TRIG 51 02/12/2023 1459   HDL 49 (L) 02/12/2023 1459   HDL 47 09/20/2020 0844   CHOLHDL 3.8 02/12/2023 1459   LDLCALC 123 (H) 02/12/2023 1459    CBC    Component Value Date/Time   WBC 11.9 (H) 02/12/2023 1459   RBC 5.15 (H) 02/12/2023 1459   HGB 12.2 02/12/2023 1459   HGB 12.8 06/17/2022 1113   HCT 38.9 02/12/2023 1459   HCT 42.3 06/17/2022 1113   PLT 255 02/12/2023 1459   PLT 286 06/17/2022 1113   MCV 75.5 (L) 02/12/2023 1459   MCV 77 (L) 06/17/2022 1113   MCH 23.7 (L) 02/12/2023 1459   MCHC 31.4 (L) 02/12/2023 1459   RDW 13.8 02/12/2023 1459   RDW 14.4 06/17/2022 1113   LYMPHSABS 2.0 06/17/2022 1113   EOSABS 0.2 06/17/2022 1113   BASOSABS  0.0 06/17/2022 1113    Hgb A1C Lab Results  Component Value Date   HGBA1C 5.8 (H) 02/12/2023           Assessment & Plan:      RTC in 6 months for your annual exam Helayne Lo, NP

## 2023-08-11 ENCOUNTER — Ambulatory Visit: Payer: Self-pay | Admitting: Internal Medicine

## 2023-08-11 LAB — CBC
HCT: 39.3 % (ref 35.0–45.0)
Hemoglobin: 12.2 g/dL (ref 11.7–15.5)
MCH: 23.8 pg — ABNORMAL LOW (ref 27.0–33.0)
MCHC: 31 g/dL — ABNORMAL LOW (ref 32.0–36.0)
MCV: 76.6 fL — ABNORMAL LOW (ref 80.0–100.0)
MPV: 12.2 fL (ref 7.5–12.5)
Platelets: 312 10*3/uL (ref 140–400)
RBC: 5.13 10*6/uL — ABNORMAL HIGH (ref 3.80–5.10)
RDW: 13.6 % (ref 11.0–15.0)
WBC: 11.3 10*3/uL — ABNORMAL HIGH (ref 3.8–10.8)

## 2023-08-11 LAB — COMPREHENSIVE METABOLIC PANEL WITH GFR
AG Ratio: 1.2 (calc) (ref 1.0–2.5)
ALT: 13 U/L (ref 6–29)
AST: 14 U/L (ref 10–35)
Albumin: 3.7 g/dL (ref 3.6–5.1)
Alkaline phosphatase (APISO): 80 U/L (ref 31–125)
BUN/Creatinine Ratio: 17 (calc) (ref 6–22)
BUN: 19 mg/dL (ref 7–25)
CO2: 31 mmol/L (ref 20–32)
Calcium: 9.4 mg/dL (ref 8.6–10.2)
Chloride: 102 mmol/L (ref 98–110)
Creat: 1.15 mg/dL — ABNORMAL HIGH (ref 0.50–0.99)
Globulin: 3.1 g/dL (ref 1.9–3.7)
Glucose, Bld: 90 mg/dL (ref 65–139)
Potassium: 3.6 mmol/L (ref 3.5–5.3)
Sodium: 140 mmol/L (ref 135–146)
Total Bilirubin: 0.4 mg/dL (ref 0.2–1.2)
Total Protein: 6.8 g/dL (ref 6.1–8.1)
eGFR: 59 mL/min/{1.73_m2} — ABNORMAL LOW (ref >=60)

## 2023-08-11 LAB — LIPID PANEL
Cholesterol: 174 mg/dL (ref <200)
HDL: 46 mg/dL — ABNORMAL LOW (ref >=50)
LDL Cholesterol (Calc): 109 mg/dL — ABNORMAL HIGH
Non-HDL Cholesterol (Calc): 128 mg/dL (ref <130)
Total CHOL/HDL Ratio: 3.8 (calc) (ref <5.0)
Triglycerides: 94 mg/dL (ref <150)

## 2023-08-11 LAB — HEMOGLOBIN A1C
Hgb A1c MFr Bld: 6.1 % — ABNORMAL HIGH (ref <5.7)
Mean Plasma Glucose: 128 mg/dL
eAG (mmol/L): 7.1 mmol/L

## 2023-08-11 MED ORDER — SIMVASTATIN 40 MG PO TABS: 40.0000 mg | ORAL_TABLET | Freq: Every day | ORAL | 1 refills | Status: AC

## 2023-09-22 ENCOUNTER — Ambulatory Visit: Admitting: Obstetrics & Gynecology

## 2023-09-29 ENCOUNTER — Telehealth: Admitting: Family Medicine

## 2023-09-29 DIAGNOSIS — J069 Acute upper respiratory infection, unspecified: Secondary | ICD-10-CM | POA: Diagnosis not present

## 2023-09-29 NOTE — Progress Notes (Signed)
 Virtual Visit Consent   Gabriella Riley, you are scheduled for a virtual visit with a [facility] provider today. Just as with appointments in the office, your consent must be obtained to participate. Your consent will be active for this visit and any virtual visit you may have with one of our providers in the next 365 days. If you have a MyChart account, a copy of this consent can be sent to you electronically.  As this is a virtual visit, video technology does not allow for your provider to perform a traditional examination. This may limit your provider's ability to fully assess your condition. If your provider identifies any concerns that need to be evaluated in person or the need to arrange testing (such as labs, EKG, etc.), we will make arrangements to do so. Although advances in technology are sophisticated, we cannot ensure that it will always work on either your end or our end. If the connection with a video visit is poor, the visit may have to be switched to a telephone visit. With either a video or telephone visit, we are not always able to ensure that we have a secure connection.  By engaging in this virtual visit, you consent to the provision of healthcare and authorize for your insurance to be billed (if applicable) for the services provided during this visit. Depending on your insurance coverage, you may receive a charge related to this service.  I need to obtain your verbal consent now. Are you willing to proceed with your visit today? Gabriella Riley has provided verbal consent on 09/29/2023 for a virtual visit (video or telephone). Olam [name], FNP  Date: 09/29/2023 1:54 PM   Virtual Visit via Video Note   I, Olam [name], connected with  Gabriella Riley  ([id number], September 16, 1975) on 09/29/23 at  1:30 PM EDT by a video-enabled telemedicine application and verified that I am speaking with the correct person using two identifiers.  Location: Patient: Virtual Visit Location  Patient: Other: work Provider: Pharmacist, community: Home Office   I discussed the limitations of evaluation and management by telemedicine and the availability of in person appointments. The patient expressed understanding and agreed to proceed.    History of Present Illness: Gabriella Riley is a 48 y.o. who identifies as a female who was assigned female at birth, and is being seen today for upper respiratory symptoms. Recently returned from a cruise on Friday morning. She reports hat she felt bad on Friday. Took a COVID test on Saturday and Monday which were negative. Unable to sleep the last few nights. Body aches. Sinus pain and pressure on Saturday. Sinus pain is only on left side of the face, at rest. Tender when she presses on both cheeks and forehead. Tickle in her throat causing her to cough. Drainage is going down her throat and makes her slightly nauseated. No fevers or chills. No vomiting. Shower helps open her up and breath a little better.    Tried Theraflu over the weekend (made her tired), extra strength Day Quil yesterday and today.  HPI:  Problems:  Patient Active Problem List   Diagnosis Date Noted   GERD (gastroesophageal reflux disease) 08/10/2023   Eczema 08/10/2023   Insomnia 08/11/2022   Prediabetes 12/19/2020   HLD (hyperlipidemia) 12/19/2020   Class 3 severe obesity due to excess calories with body mass index (BMI) of 40.0 to 44.9 in adult 12/19/2020   Essential hypertension 10/04/2019  Arthritis of both knees 01/02/2015    Allergies:  Allergies  Allergen Reactions   Shellfish Allergy     Latex Rash   Medications:  Current Outpatient Medications:    acetaminophen  (TYLENOL ) 500 MG tablet, Take 500-1,000 mg by mouth every 6 (six) hours as needed (for pain.)., Disp: , Rfl:    albuterol  (VENTOLIN  HFA) 108 (90 Base) MCG/ACT inhaler, Inhale 2 puffs into the lungs every 6 (six) hours as needed for wheezing or shortness of breath., Disp: 8 g, Rfl: 0    chlorthalidone  (HYGROTON ) 25 MG tablet, TAKE 1 TABLET (25 MG TOTAL) BY MOUTH DAILY., Disp: 90 tablet, Rfl: 1   famotidine  (PEPCID ) 20 MG tablet, Take 1 tablet (20 mg total) by mouth 2 (two) times daily as needed for heartburn or indigestion., Disp: 60 tablet, Rfl: 5   fexofenadine  (ALLEGRA  ALLERGY ) 180 MG tablet, Take 1 tablet (180 mg total) by mouth 2 (two) times daily as needed., Disp: 60 tablet, Rfl: 5   fluticasone  (FLONASE ) 50 MCG/ACT nasal spray, Place 2 sprays into both nostrils daily., Disp: 16 g, Rfl: 5   meloxicam  (MOBIC ) 7.5 MG tablet, Take 1 tablet (7.5 mg total) by mouth daily., Disp: 15 tablet, Rfl: 0   Multiple Vitamins-Minerals (ADULT GUMMY PO), Take 2 tablets by mouth daily., Disp: , Rfl:    simvastatin  (ZOCOR ) 40 MG tablet, Take 1 tablet (40 mg total) by mouth at bedtime., Disp: 90 tablet, Rfl: 1   traZODone  (DESYREL ) 50 MG tablet, TAKE 0.5-1 TABLET AT BEDTIME AS NEEDED FOR SLEEP., Disp: 90 tablet, Rfl: 1   Vitamin D , Ergocalciferol , (DRISDOL ) 1.25 MG (50000 UNIT) CAPS capsule, TAKE 1 CAPSULE (50,000 UNITS TOTAL) BY MOUTH EVERY 7 (SEVEN) DAYS, Disp: 12 capsule, Rfl: 0  Observations/Objective: Patient is well-developed, well-nourished in no acute distress but appears feeling unwell.  Resting comfortably at work.  Head is normocephalic, atraumatic.  No labored breathing. No cough observed. Speech is clear and coherent with logical content.  Patient is alert and oriented at baseline.   Assessment and Plan: 1. Viral upper respiratory infection (Primary)   Increase fluid intake.  Tylenol  or Ibuprofen  may be taken as needed for fever/chills.  Guaifenesin XR 600mg  two tablets twice a day to help with congestion.  Recommend saline spray to each nostril to help thin mucus. Warm moist compresses laying on forehead or across your nose may help relieve some pressure in sinuses.  We discussed that most illnesses within the first week are viral in nature and antibiotics  will unfortunately not prevent them from developing into a bacterial infection, but will also not help resolve symptoms quicker. For symptom management I would recommend utilizing the guaifenesin (thins mucus), restarting your allergy  medication Allegra  and Flonase  (steroid nasal spray at bedtime), and saline spray as needed during the day. If symptoms have not significantly improved by Friday 10/02/2023 I would recommend submitting an E-visit or following up with your PCP. After 7 days of persistent symptoms an antibiotic may be reconsidered.  Follow Up Instructions: I discussed the assessment and treatment plan with the patient. The patient was provided an opportunity to ask questions and all were answered. The patient agreed with the plan and demonstrated an understanding of the instructions.  A copy of instructions were sent to the patient via MyChart unless otherwise noted below.   The patient was advised to call back or seek an in-person evaluation if the symptoms worsen or if the condition fails to improve as anticipated.    Olam [name],  FNP

## 2023-09-29 NOTE — Patient Instructions (Addendum)
 Gabriella Riley, thank you for joining Olam [name], FNP for today's virtual visit.  While this provider is not your primary care provider (PCP), if your PCP is located in our provider database this encounter information will be shared with them immediately following your visit.   A [facility] MyChart account gives you access to today's visit and all your visits, tests, and labs performed at [facility]  click here if you don't have a [facility] MyChart account or go to [email or web address]  Consent: (Patient) Gabriella Riley provided verbal consent for this virtual visit at the beginning of the encounter.  Current Medications:  Current Outpatient Medications:    acetaminophen  (TYLENOL ) 500 MG tablet, Take 500-1,000 mg by mouth every 6 (six) hours as needed (for pain.)., Disp: , Rfl:    albuterol  (VENTOLIN  HFA) 108 (90 Base) MCG/ACT inhaler, Inhale 2 puffs into the lungs every 6 (six) hours as needed for wheezing or shortness of breath., Disp: 8 g, Rfl: 0   chlorthalidone  (HYGROTON ) 25 MG tablet, TAKE 1 TABLET (25 MG TOTAL) BY MOUTH DAILY., Disp: 90 tablet, Rfl: 1   famotidine  (PEPCID ) 20 MG tablet, Take 1 tablet (20 mg total) by mouth 2 (two) times daily as needed for heartburn or indigestion., Disp: 60 tablet, Rfl: 5   fexofenadine  (ALLEGRA  ALLERGY ) 180 MG tablet, Take 1 tablet (180 mg total) by mouth 2 (two) times daily as needed., Disp: 60 tablet, Rfl: 5   fluticasone  (FLONASE ) 50 MCG/ACT nasal spray, Place 2 sprays into both nostrils daily., Disp: 16 g, Rfl: 5   meloxicam  (MOBIC ) 7.5 MG tablet, Take 1 tablet (7.5 mg total) by mouth daily., Disp: 15 tablet, Rfl: 0   Multiple Vitamins-Minerals (ADULT GUMMY PO), Take 2 tablets by mouth daily., Disp: , Rfl:    simvastatin  (ZOCOR ) 40 MG tablet, Take 1 tablet (40 mg total) by mouth at bedtime., Disp: 90 tablet, Rfl: 1   traZODone  (DESYREL ) 50 MG tablet, TAKE 0.5-1 TABLET AT BEDTIME AS NEEDED FOR SLEEP., Disp: 90 tablet,  Rfl: 1   Vitamin D , Ergocalciferol , (DRISDOL ) 1.25 MG (50000 UNIT) CAPS capsule, TAKE 1 CAPSULE (50,000 UNITS TOTAL) BY MOUTH EVERY 7 (SEVEN) DAYS, Disp: 12 capsule, Rfl: 0   Medications ordered in this encounter:  No orders of the defined types were placed in this encounter.    *If you need refills on other medications prior to your next appointment, please contact your pharmacy*  Follow-Up: Call back or seek an in-person evaluation if the symptoms worsen or if the condition fails to improve as anticipated.  [facility] [phone]  Other Instructions  Increase fluid intake.  Tylenol  or Ibuprofen  may be taken as needed for fever/chills.  Guaifenesin XR(plain Mucinex) 600mg  two tablets twice a day to help with congestion.  Recommend saline spray to each nostril to help thin mucus. Warm moist compresses laying on forehead or across your nose may help relieve some pressure in sinuses.  We discussed that most illnesses within the first week are viral in nature and antibiotics will unfortunately not prevent them from developing into a bacterial infection, but will also not help resolve symptoms quicker. For symptom management I would recommend utilizing the guaifenesin (thins mucus), restarting your allergy  medication Allegra  and Flonase  (steroid nasal spray at bedtime), and saline spray as needed during the day. If symptoms have not significantly improved by Friday 10/02/2023 I would recommend submitting an E-visit or following up with your PCP. After 7 days of  persistent symptoms an antibiotic may be reconsidered.   If you have worsening symptoms I would recommend follow up sooner in person.  If you have been instructed to have an in-person evaluation today at a local Urgent Care facility, please use the link below. It will take you to a list of all of our available [facility], including address, phone number and hours of operation. Please do not delay care.  [facility]  If you or a family member do not have a primary care provider, use the link below to schedule a visit and establish care. When you choose a [facility] primary care physician or advanced practice provider, you gain a long-term partner in health. Find a Primary Care Provider  Learn more about [facility] in-office and virtual care options:  - Get Care Now

## 2023-09-30 ENCOUNTER — Ambulatory Visit: Admitting: Obstetrics & Gynecology

## 2023-09-30 ENCOUNTER — Encounter: Payer: Self-pay | Admitting: Obstetrics & Gynecology

## 2023-09-30 ENCOUNTER — Other Ambulatory Visit (HOSPITAL_COMMUNITY)
Admission: RE | Admit: 2023-09-30 | Discharge: 2023-09-30 | Disposition: A | Discharge status: Home / Self Care | Source: Ambulatory Visit | Attending: Obstetrics & Gynecology | Admitting: Obstetrics & Gynecology

## 2023-09-30 VITALS — BP 120/86 | HR 75 | Wt 273.6 lb

## 2023-09-30 DIAGNOSIS — N951 Menopausal and female climacteric states: Secondary | ICD-10-CM

## 2023-09-30 DIAGNOSIS — Z01419 Encounter for gynecological examination (general) (routine) without abnormal findings: Secondary | ICD-10-CM | POA: Diagnosis present

## 2023-09-30 DIAGNOSIS — Z113 Encounter for screening for infections with a predominantly sexual mode of transmission: Secondary | ICD-10-CM | POA: Insufficient documentation

## 2023-09-30 MED ORDER — CITALOPRAM HYDROBROMIDE 20 MG PO TABS: 20.0000 mg | ORAL_TABLET | Freq: Every day | ORAL | 12 refills | Status: DC

## 2023-09-30 NOTE — Progress Notes (Signed)
 GYNECOLOGY ANNUAL PREVENTATIVE CARE ENCOUNTER NOTE  History:    TANIJA GERMANI is a 48 y.o. [phone] female here for a routine annual gynecologic exam.  Current complaints: patient feels she is in perimenopause.  She has rare episodes of spotting; also has Mirena  IUD in place since 2021.  She is more bothered about her mood symptoms, she feels depressed sometimes, and is seen by a therapist.  Also has sleep disturbance. Had one episode of night sweats, no hot flashes. Reports increased appetite and weight gain.   Denies abnormal vaginal bleeding, discharge, pelvic pain, problems with intercourse or other gynecologic concerns.  Gynecologic History No LMP recorded. (Menstrual status: IUD). Contraception: tubal ligation Last Pap: 06/30/2022. Result was normal with negative HPV Last Mammogram: 05/04/2023.  Result was normal Last Colonoscopy: 02/20/2022.  Result was benign  Obstetric History OB History  Gravida Para Term Preterm AB Living  4 4 4  0 0 4  SAB IAB Ectopic Multiple Live Births  0 0 0 0 4    # Outcome Date GA Lbr Len/2nd Weight Sex Type Anes PTL Lv  4 Term 08/22/10 [redacted]w[redacted]d  7 lb 1 oz (3.204 kg) F Vag-Spont   LIV     Complications: Pre-eclampsia  3 Term 05/14/09 [redacted]w[redacted]d  8 lb 3 oz (3.714 kg) M Vag-Spont   LIV     Complications: Pre-eclampsia  2 Term 06/26/00 [redacted]w[redacted]d  6 lb 11 oz (3.033 kg) M Vag-Spont   LIV  1 Term 07/26/95 [redacted]w[redacted]d  7 lb 9 oz (3.43 kg) F Vag-Spont   LIV    Past Medical History:  Diagnosis Date   Anemia    Angio-edema    Arthritis    Back pain    Bilateral swelling of feet and ankles    Constipation    Food allergy     GERD (gastroesophageal reflux disease)    History of blood clots    Hyperlipidemia    Hypertension    Joint pain    Lactose intolerance    Other fatigue    Shortness of breath    Shortness of breath on exertion    Syphilis 07/02/2022   Urticaria    Vitamin D  deficiency     Past Surgical History:  Procedure Laterality Date   BREAST  BIOPSY Left 2007   BREAST EXCISIONAL BIOPSY     TUBAL LIGATION      Current Outpatient Medications on File Prior to Visit  Medication Sig Dispense Refill   acetaminophen  (TYLENOL ) 500 MG tablet Take 500-1,000 mg by mouth every 6 (six) hours as needed (for pain.).     albuterol  (VENTOLIN  HFA) 108 (90 Base) MCG/ACT inhaler Inhale 2 puffs into the lungs every 6 (six) hours as needed for wheezing or shortness of breath. 8 g 0   chlorthalidone  (HYGROTON ) 25 MG tablet TAKE 1 TABLET (25 MG TOTAL) BY MOUTH DAILY. 90 tablet 1   famotidine  (PEPCID ) 20 MG tablet Take 1 tablet (20 mg total) by mouth 2 (two) times daily as needed for heartburn or indigestion. 60 tablet 5   fexofenadine  (ALLEGRA  ALLERGY ) 180 MG tablet Take 1 tablet (180 mg total) by mouth 2 (two) times daily as needed. 60 tablet 5   fluticasone  (FLONASE ) 50 MCG/ACT nasal spray Place 2 sprays into both nostrils daily. 16 g 5   Multiple Vitamins-Minerals (ADULT GUMMY PO) Take 2 tablets by mouth daily.     simvastatin  (ZOCOR ) 40 MG tablet Take 1 tablet (40 mg total) by mouth at  bedtime. 90 tablet 1   Vitamin D , Ergocalciferol , (DRISDOL ) 1.25 MG (50000 UNIT) CAPS capsule TAKE 1 CAPSULE (50,000 UNITS TOTAL) BY MOUTH EVERY 7 (SEVEN) DAYS 12 capsule 0   No current facility-administered medications on file prior to visit.    Allergies  Allergen Reactions   Shellfish Allergy     Latex Rash    Social History:  reports that she has never smoked. She has never been exposed to tobacco smoke. She has never used smokeless tobacco. She reports current alcohol use. She reports that she does not use drugs.  Family History  Problem Relation Age of Onset   Allergic rhinitis Mother    Healthy Mother    Obesity Father    Sleep apnea Father    Heart disease Father    Diabetes Father    Hyperlipidemia Father    Hypertension Father    Heart failure Father    Healthy Sister    Prostate cancer Brother    Heart attack Brother    Arthritis Maternal  Grandmother    Heart disease Other    Sleep apnea Other    Obesity Other    Colon cancer Neg Hx    Colon polyps Neg Hx    Esophageal cancer Neg Hx    Rectal cancer Neg Hx    Stomach cancer Neg Hx     The following portions of the patient's history were reviewed and updated as appropriate: allergies, current medications, past family history, past medical history, past social history, past surgical history and problem list.  Review of Systems Pertinent items noted in HPI and remainder of comprehensive ROS otherwise negative.  Physical Exam:  BP 120/86   Pulse 75   Wt 273 lb 9.6 oz (124.1 kg)   BMI 42.85 kg/m  CONSTITUTIONAL: Well-developed, well-nourished female in no acute distress.  HENT:  Normocephalic, atraumatic, External right and left ear normal. Oropharynx is clear and moist EYES: Conjunctivae and EOM are normal. Pupils are equal, round, and reactive to light. No scleral icterus.  NECK: Normal range of motion, supple, no masses observed. SKIN: Skin is warm and dry. No rash noted. Not diaphoretic. No erythema. No pallor. MUSCULOSKELETAL: Normal range of motion. No tenderness.  No cyanosis, clubbing, or edema.  2+ distal pulses. NEUROLOGIC: Alert and oriented to person, place, and time. Normal muscle tone coordination.  PSYCHIATRIC: Normal mood and affect. Normal behavior. Normal judgment and thought content. CARDIOVASCULAR: Normal heart rate noted, regular rhythm RESPIRATORY: Clear to auscultation bilaterally. Effort and breath sounds normal, no problems with respiration noted. BREASTS: Symmetric in size. No masses, tenderness, skin changes, nipple drainage, or lymphadenopathy bilaterally.  Performed in the presence of a chaperone. ABDOMEN: Soft, obese, no distention appreciated.  No tenderness, rebound or guarding.  PELVIC: Normal appearing external genitalia and urethral meatus; normal appearing vaginal mucosa and cervix. IUD strings visualized.  Normal appearing discharge.   Pap smear obtained.  Unable to palpate uterus or adnexa secondary to habitus.  Performed in the presence of a chaperone.  Assessment and Plan:     1. Perimenopausal symptoms Discussed perimenopause and its implications.  Given that her predominant symptom is depressed mood, offered treatment with SSRI.  She agrees to this plan. Celexa  prescribed, will monitor effect and follow up in 2 months.  Patient aware that it does take over 4 weeks to feel effects of any SSRI.  She will continue to see her therapist also. - citalopram  (CELEXA ) 20 MG tablet; Take 1 tablet (20 mg total) by  mouth daily.  Dispense: 30 tablet; Refill: 12  2. Routine screening for STI (sexually transmitted infection) STI screen done, will follow up results and manage accordingly. - Cytology - PAP ancillary testing - RPR+HBsAg+HCVAb+HIV  3. Well woman exam with routine gynecological exam (Primary) - Cytology - PAP Will follow up results of pap smear and manage accordingly. Mammogram and colon cancer screening are up to date. Routine preventative health maintenance measures emphasized. Please refer to After Visit Summary for other counseling recommendations.      GLORIS HUGGER, MD, FACOG Obstetrician & Gynecologist, [facility] for [employer], [facility]

## 2023-10-01 LAB — RPR+HBSAG+HCVAB+...
HIV Screen 4th Generation wRfx: NONREACTIVE
Hep C Virus Ab: NONREACTIVE
Hepatitis B Surface Ag: NEGATIVE
RPR Ser Ql: REACTIVE — AB

## 2023-10-01 LAB — RPR, QUANT+TP ABS (REFLEX)
Rapid Plasma Reagin, Quant: 1:1 {titer} — ABNORMAL HIGH
T Pallidum Abs: REACTIVE — AB

## 2023-10-02 ENCOUNTER — Ambulatory Visit: Payer: Self-pay | Admitting: Obstetrics & Gynecology

## 2023-10-05 ENCOUNTER — Telehealth: Admitting: Physician Assistant

## 2023-10-05 DIAGNOSIS — J019 Acute sinusitis, unspecified: Secondary | ICD-10-CM

## 2023-10-05 DIAGNOSIS — B9689 Other specified bacterial agents as the cause of diseases classified elsewhere: Secondary | ICD-10-CM

## 2023-10-05 DIAGNOSIS — T3695XA Adverse effect of unspecified systemic antibiotic, initial encounter: Secondary | ICD-10-CM

## 2023-10-05 DIAGNOSIS — B379 Candidiasis, unspecified: Secondary | ICD-10-CM | POA: Diagnosis not present

## 2023-10-05 MED ORDER — AMOXICILLIN-POT CLAVULANATE 875-125 MG PO TABS
1.0000 | ORAL_TABLET | Freq: Two times a day (BID) | ORAL | 0 refills | Status: DC
Start: 2023-10-05 — End: 2024-02-02

## 2023-10-05 MED ORDER — FLUCONAZOLE 150 MG PO TABS: 150.0000 mg | ORAL_TABLET | ORAL | 0 refills | Status: DC | PRN

## 2023-10-05 NOTE — Progress Notes (Signed)
 Virtual Visit Consent   Gabriella Riley, you are scheduled for a virtual visit with a [facility] provider today. Just as with appointments in the office, your consent must be obtained to participate. Your consent will be active for this visit and any virtual visit you may have with one of our providers in the next 365 days. If you have a MyChart account, a copy of this consent can be sent to you electronically.  As this is a virtual visit, video technology does not allow for your provider to perform a traditional examination. This may limit your provider's ability to fully assess your condition. If your provider identifies any concerns that need to be evaluated in person or the need to arrange testing (such as labs, EKG, etc.), we will make arrangements to do so. Although advances in technology are sophisticated, we cannot ensure that it will always work on either your end or our end. If the connection with a video visit is poor, the visit may have to be switched to a telephone visit. With either a video or telephone visit, we are not always able to ensure that we have a secure connection.  By engaging in this virtual visit, you consent to the provision of healthcare and authorize for your insurance to be billed (if applicable) for the services provided during this visit. Depending on your insurance coverage, you may receive a charge related to this service.  I need to obtain your verbal consent now. Are you willing to proceed with your visit today? Gabriella Riley has provided verbal consent on 10/05/2023 for a virtual visit (video or telephone). Gabriella CHRISTELLA Dickinson, PA-C  Date: 10/05/2023 9:21 AM   Virtual Visit via Video Note   I, Gabriella Riley, connected with  Gabriella Riley  ([id number], 05/13/1975) on 10/05/23 at  9:15 AM EDT by a video-enabled telemedicine application and verified that I am speaking with the correct person using two identifiers.  Location: Patient: Virtual  Visit Location Patient: Home Provider: Virtual Visit Location Provider: Home Office   I discussed the limitations of evaluation and management by telemedicine and the availability of in person appointments. The patient expressed understanding and agreed to proceed.    History of Present Illness: Gabriella Riley is a 48 y.o. who identifies as a female who was assigned female at birth, and is being seen today for sinus congestion.  HPI: Sinusitis This is a new problem. The current episode started 1 to 4 weeks ago (seen virtually on 09/29/23 and diagnosed with viral URI). The problem has been gradually worsening since onset. There has been no fever. The pain is moderate. Associated symptoms include congestion, coughing (dry), ear pain (left, pressure), headaches (left) and sinus pressure (left). Pertinent negatives include no chills, diaphoresis, hoarse voice or sore throat. (Post nasal drainage, nausea from drainage) Treatments tried: flonase , mucinex, allegra , theraflu, dayquil. The treatment provided mild relief.     Problems:  Patient Active Problem List   Diagnosis Date Noted   GERD (gastroesophageal reflux disease) 08/10/2023   Eczema 08/10/2023   Insomnia 08/11/2022   Prediabetes 12/19/2020   HLD (hyperlipidemia) 12/19/2020   Class 3 severe obesity due to excess calories with body mass index (BMI) of 40.0 to 44.9 in adult 12/19/2020   Essential hypertension 10/04/2019   Arthritis of both knees 01/02/2015    Allergies:  Allergies  Allergen Reactions   Shellfish Allergy     Latex Rash   Medications:  Current Outpatient  Medications:    acetaminophen  (TYLENOL ) 500 MG tablet, Take 500-1,000 mg by mouth every 6 (six) hours as needed (for pain.)., Disp: , Rfl:    albuterol  (VENTOLIN  HFA) 108 (90 Base) MCG/ACT inhaler, Inhale 2 puffs into the lungs every 6 (six) hours as needed for wheezing or shortness of breath., Disp: 8 g, Rfl: 0   amoxicillin -clavulanate (AUGMENTIN ) 875-125 MG  tablet, Take 1 tablet by mouth 2 (two) times daily., Disp: 14 tablet, Rfl: 0   chlorthalidone  (HYGROTON ) 25 MG tablet, TAKE 1 TABLET (25 MG TOTAL) BY MOUTH DAILY., Disp: 90 tablet, Rfl: 1   citalopram  (CELEXA ) 20 MG tablet, Take 1 tablet (20 mg total) by mouth daily., Disp: 30 tablet, Rfl: 12   famotidine  (PEPCID ) 20 MG tablet, Take 1 tablet (20 mg total) by mouth 2 (two) times daily as needed for heartburn or indigestion., Disp: 60 tablet, Rfl: 5   fexofenadine  (ALLEGRA  ALLERGY ) 180 MG tablet, Take 1 tablet (180 mg total) by mouth 2 (two) times daily as needed., Disp: 60 tablet, Rfl: 5   fluconazole  (DIFLUCAN ) 150 MG tablet, Take 1 tablet (150 mg total) by mouth every 3 (three) days as needed., Disp: 2 tablet, Rfl: 0   fluticasone  (FLONASE ) 50 MCG/ACT nasal spray, Place 2 sprays into both nostrils daily., Disp: 16 g, Rfl: 5   Multiple Vitamins-Minerals (ADULT GUMMY PO), Take 2 tablets by mouth daily., Disp: , Rfl:    simvastatin  (ZOCOR ) 40 MG tablet, Take 1 tablet (40 mg total) by mouth at bedtime., Disp: 90 tablet, Rfl: 1   Vitamin D , Ergocalciferol , (DRISDOL ) 1.25 MG (50000 UNIT) CAPS capsule, TAKE 1 CAPSULE (50,000 UNITS TOTAL) BY MOUTH EVERY 7 (SEVEN) DAYS, Disp: 12 capsule, Rfl: 0  Observations/Objective: Patient is well-developed, well-nourished in no acute distress.  Resting comfortably at home.  Head is normocephalic, atraumatic.  No labored breathing.  Speech is clear and coherent with logical content.  Patient is alert and oriented at baseline.    Assessment and Plan: 1. Acute bacterial sinusitis (Primary) - amoxicillin -clavulanate (AUGMENTIN ) 875-125 MG tablet; Take 1 tablet by mouth 2 (two) times daily.  Dispense: 14 tablet; Refill: 0  2. Antibiotic-induced yeast infection - fluconazole  (DIFLUCAN ) 150 MG tablet; Take 1 tablet (150 mg total) by mouth every 3 (three) days as needed.  Dispense: 2 tablet; Refill: 0  - Worsening symptoms that have not responded to OTC medications.   - Will give Augmentin  - Continue allergy  medications.  - Steam and humidifier can help - Stay well hydrated and get plenty of rest.  - Diflucan  given as prophylaxis as patient tends to get vaginal yeast infections with antibiotic use - Seek in person evaluation if no symptom improvement or if symptoms worsen   Follow Up Instructions: I discussed the assessment and treatment plan with the patient. The patient was provided an opportunity to ask questions and all were answered. The patient agreed with the plan and demonstrated an understanding of the instructions.  A copy of instructions were sent to the patient via MyChart unless otherwise noted below.    The patient was advised to call back or seek an in-person evaluation if the symptoms worsen or if the condition fails to improve as anticipated.    Gabriella CHRISTELLA Dickinson, PA-C

## 2023-10-05 NOTE — Patient Instructions (Signed)
 Gabriella Riley, thank you for joining Delon Gabriella Dickinson, Gabriella Riley for today's virtual visit.  While this provider is not your primary care provider (PCP), if your PCP is located in our provider database this encounter information will be shared with them immediately following your visit.   A [facility] MyChart account gives you access to today's visit and all your visits, tests, and labs performed at [facility]  click here if you don't have a [facility] MyChart account or go to [email or web address]  Consent: (Patient) Gabriella Riley provided verbal consent for this virtual visit at the beginning of the encounter.  Current Medications:  Current Outpatient Medications:    acetaminophen  (TYLENOL ) 500 MG tablet, Take 500-1,000 mg by mouth every 6 (six) hours as needed (for pain.)., Disp: , Rfl:    albuterol  (VENTOLIN  HFA) 108 (90 Base) MCG/ACT inhaler, Inhale 2 puffs into the lungs every 6 (six) hours as needed for wheezing or shortness of breath., Disp: 8 g, Rfl: 0   amoxicillin -clavulanate (AUGMENTIN ) 875-125 MG tablet, Take 1 tablet by mouth 2 (two) times daily., Disp: 14 tablet, Rfl: 0   chlorthalidone  (HYGROTON ) 25 MG tablet, TAKE 1 TABLET (25 MG TOTAL) BY MOUTH DAILY., Disp: 90 tablet, Rfl: 1   citalopram  (CELEXA ) 20 MG tablet, Take 1 tablet (20 mg total) by mouth daily., Disp: 30 tablet, Rfl: 12   famotidine  (PEPCID ) 20 MG tablet, Take 1 tablet (20 mg total) by mouth 2 (two) times daily as needed for heartburn or indigestion., Disp: 60 tablet, Rfl: 5   fexofenadine  (ALLEGRA  ALLERGY ) 180 MG tablet, Take 1 tablet (180 mg total) by mouth 2 (two) times daily as needed., Disp: 60 tablet, Rfl: 5   fluconazole  (DIFLUCAN ) 150 MG tablet, Take 1 tablet (150 mg total) by mouth every 3 (three) days as needed., Disp: 2 tablet, Rfl: 0   fluticasone  (FLONASE ) 50 MCG/ACT nasal spray, Place 2 sprays into both nostrils daily., Disp: 16 g, Rfl: 5   Multiple Vitamins-Minerals (ADULT  GUMMY PO), Take 2 tablets by mouth daily., Disp: , Rfl:    simvastatin  (ZOCOR ) 40 MG tablet, Take 1 tablet (40 mg total) by mouth at bedtime., Disp: 90 tablet, Rfl: 1   Vitamin D , Ergocalciferol , (DRISDOL ) 1.25 MG (50000 UNIT) CAPS capsule, TAKE 1 CAPSULE (50,000 UNITS TOTAL) BY MOUTH EVERY 7 (SEVEN) DAYS, Disp: 12 capsule, Rfl: 0   Medications ordered in this encounter:  Meds ordered this encounter  Medications   amoxicillin -clavulanate (AUGMENTIN ) 875-125 MG tablet    Sig: Take 1 tablet by mouth 2 (two) times daily.    Dispense:  14 tablet    Refill:  0    Supervising Provider:   LAMPTEY, PHILIP O [[id number]]   fluconazole  (DIFLUCAN ) 150 MG tablet    Sig: Take 1 tablet (150 mg total) by mouth every 3 (three) days as needed.    Dispense:  2 tablet    Refill:  0    Supervising Provider:   LAMPTEY, PHILIP O [[id number]]     *If you need refills on other medications prior to your next appointment, please contact your pharmacy*  Follow-Up: Call back or seek an in-person evaluation if the symptoms worsen or if the condition fails to improve as anticipated.  [facility] [phone]  Other Instructions Sinus Infection, Adult A sinus infection, also called sinusitis, is inflammation of your sinuses. Sinuses are hollow spaces in the bones around your face. Your sinuses are located: Around your eyes. In the  middle of your forehead. Behind your nose. In your cheekbones. Mucus normally drains out of your sinuses. When your nasal tissues become inflamed or swollen, mucus can become trapped or blocked. This allows bacteria, viruses, and fungi to grow, which leads to infection. Most infections of the sinuses are caused by a virus. A sinus infection can develop quickly. It can last for up to 4 weeks (acute) or for more than 12 weeks (chronic). A sinus infection often develops after a cold. What are the causes? This condition is caused by anything that creates swelling in the sinuses  or stops mucus from draining. This includes: Allergies. Asthma. Infection from bacteria or viruses. Deformities or blockages in your nose or sinuses. Abnormal growths in the nose (nasal polyps). Pollutants, such as chemicals or irritants in the air. Infection from fungi. This is rare. What increases the risk? You are more likely to develop this condition if you: Have a weak body defense system (immune system). Do a lot of swimming or diving. Overuse nasal sprays. Smoke. What are the signs or symptoms? The main symptoms of this condition are pain and a feeling of pressure around the affected sinuses. Other symptoms include: Stuffy nose or congestion that makes it difficult to breathe through your nose. Thick yellow or greenish drainage from your nose. Tenderness, swelling, and warmth over the affected sinuses. A cough that may get worse at night. Decreased sense of smell and taste. Extra mucus that collects in the throat or the back of the nose (postnasal drip) causing a sore throat or bad breath. Tiredness (fatigue). Fever. How is this diagnosed? This condition is diagnosed based on: Your symptoms. Your medical history. A physical exam. Tests to find out if your condition is acute or chronic. This may include: Checking your nose for nasal polyps. Viewing your sinuses using a device that has a light (endoscope). Testing for allergies or bacteria. Imaging tests, such as an MRI or CT scan. In rare cases, a bone biopsy may be done to rule out more serious types of fungal sinus disease. How is this treated? Treatment for a sinus infection depends on the cause and whether your condition is chronic or acute. If caused by a virus, your symptoms should go away on their own within 10 days. You may be given medicines to relieve symptoms. They include: Medicines that shrink swollen nasal passages (decongestants). A spray that eases inflammation of the nostrils (topical intranasal  corticosteroids). Rinses that help get rid of thick mucus in your nose (nasal saline washes). Medicines that treat allergies (antihistamines). Over-the-counter pain relievers. If caused by bacteria, your health care provider may recommend waiting to see if your symptoms improve. Most bacterial infections will get better without antibiotic medicine. You may be given antibiotics if you have: A severe infection. A weak immune system. If caused by narrow nasal passages or nasal polyps, surgery may be needed. Follow these instructions at home: Medicines Take, use, or apply over-the-counter and prescription medicines only as told by your health care provider. These may include nasal sprays. If you were prescribed an antibiotic medicine, take it as told by your health care provider. Do not stop taking the antibiotic even if you start to feel better. Hydrate and humidify  Drink enough fluid to keep your urine pale yellow. Staying hydrated will help to thin your mucus. Use a cool mist humidifier to keep the humidity level in your home above 50%. Inhale steam for 10-15 minutes, 3-4 times a day, or as told by  your health care provider. You can do this in the bathroom while a hot shower is running. Limit your exposure to cool or dry air. Rest Rest as much as possible. Sleep with your head raised (elevated). Make sure you get enough sleep each night. General instructions  Apply a warm, moist washcloth to your face 3-4 times a day or as told by your health care provider. This will help with discomfort. Use nasal saline washes as often as told by your health care provider. Wash your hands often with soap and water to reduce your exposure to germs. If soap and water are not available, use hand sanitizer. Do not smoke. Avoid being around people who are smoking (secondhand smoke). Keep all follow-up visits. This is important. Contact a health care provider if: You have a fever. Your symptoms get  worse. Your symptoms do not improve within 10 days. Get help right away if: You have a severe headache. You have persistent vomiting. You have severe pain or swelling around your face or eyes. You have vision problems. You develop confusion. Your neck is stiff. You have trouble breathing. These symptoms may be an emergency. Get help right away. Call 911. Do not wait to see if the symptoms will go away. Do not drive yourself to the hospital. Summary A sinus infection is soreness and inflammation of your sinuses. Sinuses are hollow spaces in the bones around your face. This condition is caused by nasal tissues that become inflamed or swollen. The swelling traps or blocks the flow of mucus. This allows bacteria, viruses, and fungi to grow, which leads to infection. If you were prescribed an antibiotic medicine, take it as told by your health care provider. Do not stop taking the antibiotic even if you start to feel better. Keep all follow-up visits. This is important. This information is not intended to replace advice given to you by your health care provider. Make sure you discuss any questions you have with your health care provider. Document Revised: 02/12/2021 Document Reviewed: 02/12/2021 Elsevier Patient Education  2024 Elsevier Inc.   If you have been instructed to have an in-person evaluation today at a local Urgent Care facility, please use the link below. It will take you to a list of all of our available [facility], including address, phone number and hours of operation. Please do not delay care.  [facility]  If you or a family member do not have a primary care provider, use the link below to schedule a visit and establish care. When you choose a [facility] primary care physician or advanced practice provider, you gain a long-term partner in health. Find a Primary Care Provider  Learn more about [facility] in-office and virtual care options: [facility] - Get Care Now

## 2023-10-06 LAB — CYTOLOGY - PAP
Chlamydia: NEGATIVE
Comment: NEGATIVE
Comment: NEGATIVE
Comment: NEGATIVE
Comment: NORMAL
Diagnosis: NEGATIVE
High risk HPV: NEGATIVE
Neisseria Gonorrhea: NEGATIVE
Trichomonas: NEGATIVE

## 2023-10-27 ENCOUNTER — Other Ambulatory Visit: Payer: Self-pay | Admitting: Obstetrics & Gynecology

## 2023-10-27 DIAGNOSIS — N951 Menopausal and female climacteric states: Secondary | ICD-10-CM

## 2023-11-26 ENCOUNTER — Telehealth (INDEPENDENT_AMBULATORY_CARE_PROVIDER_SITE_OTHER): Admitting: Obstetrics & Gynecology

## 2023-11-26 ENCOUNTER — Encounter: Payer: Self-pay | Admitting: Obstetrics & Gynecology

## 2023-11-26 DIAGNOSIS — N951 Menopausal and female climacteric states: Secondary | ICD-10-CM

## 2023-11-26 MED ORDER — CITALOPRAM HYDROBROMIDE 20 MG PO TABS: 40.0000 mg | ORAL_TABLET | Freq: Every day | ORAL | 12 refills | Status: DC

## 2023-11-26 NOTE — Progress Notes (Signed)
 GYNECOLOGY VIRTUAL VISIT ENCOUNTER NOTE  Provider location: [facility]   Patient location: Work  I connected with [name] on 11/26/23 at  8:55 AM EDT by MyChart Video Encounter and verified that I am speaking with the correct person using two identifiers.   I discussed the limitations, risks, security and privacy concerns of performing an evaluation and management service virtually and the availability of in person appointments. I also discussed with the patient that there may be a patient responsible charge related to this service. The patient expressed understanding and agreed to proceed.   History:  Gabriella Riley is a 48 y.o. [phone] female being followed up today after initiation of Celexa  for perimenopausal symptoms with mood disorder as the predominant concern.  Reports she feels a little better, no concerning side effects.  No other concerns.       Past Medical History:  Diagnosis Date   Anemia    Angio-edema    Arthritis    Back pain    Bilateral swelling of feet and ankles    Constipation    Food allergy     GERD (gastroesophageal reflux disease)    History of blood clots    Hyperlipidemia    Hypertension    Joint pain    Lactose intolerance    Other fatigue    Shortness of breath    Shortness of breath on exertion    Syphilis 07/02/2022   Urticaria    Vitamin D  deficiency    Past Surgical History:  Procedure Laterality Date   BREAST BIOPSY Left 2007   BREAST EXCISIONAL BIOPSY     TUBAL LIGATION     The following portions of the patient's history were reviewed and updated as appropriate: allergies, current medications, past family history, past medical history, past social history, past surgical history and problem list.   Health Maintenance:  Normal pap and negative HRHPV on 09/30/2023.  Normal mammogram on 05/04/2023.   Review of Systems:  Pertinent items noted in HPI and remainder of comprehensive ROS  otherwise negative.  Physical Exam:   General:  Alert, oriented and cooperative. Patient appears to be in no acute distress.  Mental Status: Normal mood and affect. Normal behavior. Normal judgment and thought content.   Respiratory: Normal respiratory effort, no problems with respiration noted  Rest of physical exam deferred due to type of encounter     Assessment and Plan:     1. Perimenopausal symptoms (Primary) Reassured by response, recommended to increase dosage.  She will try 30 mg first, and if this does not work, go to 40 mg daily. She was told to let us  know of any concerning side effects or other issues.  - citalopram  (CELEXA ) 20 MG tablet; Take 2 tablets (40 mg total) by mouth daily.  Dispense: 60 tablet; Refill: 12      I discussed the assessment and treatment plan with the patient. The patient was provided an opportunity to ask questions and all were answered. The patient agreed with the plan and demonstrated an understanding of the instructions.   The patient was advised to call back or seek an in-person evaluation/go to the ED if the symptoms worsen or if the condition fails to improve as anticipated.  I provided 7 minutes of face-to-face time during this encounter. I also spent 18 minutes dedicated to the care of this patient including pre-visit review of records, post visit ordering of medications and appropriate  tests or procedures, coordinating care and documenting this visit encounter.    Gloris Hugger, MD [facility] for [employer], [facility]

## 2023-12-14 ENCOUNTER — Other Ambulatory Visit: Payer: Self-pay | Admitting: Internal Medicine

## 2023-12-15 NOTE — Telephone Encounter (Signed)
 Requested Prescriptions  Refused Prescriptions Disp Refills   simvastatin  (ZOCOR ) 40 MG tablet [Pharmacy Med Name: SIMVASTATIN  40 MG TABLET] 90 tablet 1    Sig: TAKE 1 TABLET BY MOUTH EVERYDAY AT BEDTIME     Cardiovascular:  Antilipid - Statins Failed - 12/15/2023 10:19 AM      Failed - Lipid Panel in normal range within the last 12 months    Cholesterol, Total  Date Value Ref Range Status  09/20/2020 186 100 - 199 mg/dL Final   Cholesterol  Date Value Ref Range Status  08/10/2023 174 <200 mg/dL Final   LDL Cholesterol (Calc)  Date Value Ref Range Status  08/10/2023 109 (H) mg/dL (calc) Final    Comment:    Reference range: <100 . Desirable range <100 mg/dL for primary prevention;   <70 mg/dL for patients with CHD or diabetic patients  with > or = 2 CHD risk factors. SABRA LDL-C is now calculated using the Martin-Hopkins  calculation, which is a validated novel method providing  better accuracy than the Friedewald equation in the  estimation of LDL-C.  Gladis APPLETHWAITE et al. SANDREA. 7986;689(80): 2061-2068  (http://education.Quest[employer].com/faq/FAQ164)    HDL  Date Value Ref Range Status  08/10/2023 46 (L) > OR = 50 mg/dL Final  93/69/7977 47 >60 mg/dL Final   Triglycerides  Date Value Ref Range Status  08/10/2023 94 <150 mg/dL Final         Passed - Patient is not pregnant      Passed - Valid encounter within last 12 months    Recent Outpatient Visits           4 months ago Prediabetes   [facility], [place], [place]

## 2023-12-17 ENCOUNTER — Ambulatory Visit: Payer: Self-pay

## 2023-12-17 NOTE — Telephone Encounter (Signed)
 Will discuss at upcoming appt.

## 2023-12-17 NOTE — Telephone Encounter (Signed)
 FYI Only or Action Required?: FYI only for provider.  Patient was last seen in primary care on 10/05/2023 by Vivienne Delon HERO, PA-C.  Called Nurse Triage reporting Hypertension.  Symptoms began several months ago.  Interventions attempted: Prescription medications: chlorthalidone .  Symptoms are: unchanged.  Triage Disposition: See PCP Within 2 Weeks  Patient/caregiver understands and will follow disposition?: Yes  Reason for Disposition  [1] Systolic BP >= 130 OR Diastolic >= 80 AND [2] taking BP medications  Answer Assessment - Initial Assessment Questions Pt ran out of her BP medicine Chlorthalidone  for a week, was able to get a refill and restart it. BP coming down and starting to feel better, baseline BP while on medication still 130-140's. Scheduled to see PCP 12/23/23 to adjust medication, new pt appt with new PCP scheduled for 03/2023.  1. BLOOD PRESSURE: What is your blood pressure? Did you take at least two measurements 5 minutes apart?     150/110 last night, 146/98 today, normal BP is 138/86. 2. ONSET: When did you take your blood pressure?     This morning before work around 7 am. 3. HOW: How did you take your blood pressure? (e.g., automatic home BP monitor, visiting nurse)     Automatic wrist 4. HISTORY: Do you have a history of high blood pressure?     Yes 5. MEDICINES: Are you taking any medicines for blood pressure? Have you missed any doses recently?     Yes, yesterday 6. OTHER SYMPTOMS: Do you have any symptoms? (e.g., blurred vision, chest pain, difficulty breathing, headache, weakness)     Headaches, feeling like she's in a cloud, fluid retention. Improving after restarting medication.  7. PREGNANCY: Is there any chance you are pregnant? When was your last menstrual period?     No  Protocols used: Blood Pressure - High-A-AH

## 2023-12-22 ENCOUNTER — Other Ambulatory Visit (INDEPENDENT_AMBULATORY_CARE_PROVIDER_SITE_OTHER): Payer: Self-pay

## 2023-12-22 ENCOUNTER — Ambulatory Visit: Admitting: Orthopedic Surgery

## 2023-12-22 ENCOUNTER — Other Ambulatory Visit: Payer: Self-pay

## 2023-12-22 DIAGNOSIS — M79671 Pain in right foot: Secondary | ICD-10-CM | POA: Diagnosis not present

## 2023-12-22 DIAGNOSIS — M25562 Pain in left knee: Secondary | ICD-10-CM | POA: Diagnosis not present

## 2023-12-22 DIAGNOSIS — G8929 Other chronic pain: Secondary | ICD-10-CM

## 2023-12-22 DIAGNOSIS — M7661 Achilles tendinitis, right leg: Secondary | ICD-10-CM | POA: Diagnosis not present

## 2023-12-22 DIAGNOSIS — M6701 Short Achilles tendon (acquired), right ankle: Secondary | ICD-10-CM

## 2023-12-22 DIAGNOSIS — M79672 Pain in left foot: Secondary | ICD-10-CM | POA: Diagnosis not present

## 2023-12-23 ENCOUNTER — Encounter: Payer: Self-pay | Admitting: Orthopedic Surgery

## 2023-12-23 NOTE — Progress Notes (Signed)
 Office Visit Note   Patient: Gabriella Riley           Date of Birth: [birth year]           MRN: [id number] Visit Date: 12/22/2023              Requested by: Antonette Angeline ORN, NP [address] [employer] [place] [employer],  [place] [address] PCP: Antonette Angeline ORN, NP  Chief Complaint  Patient presents with   Right Achilles Tendon - Pain   Left Foot - Pain   Left Knee - Pain      HPI: Discussed the use of AI scribe software for clinical note transcription with the patient, who gave verbal consent to proceed.  History of Present Illness Gabriella Riley is a 48 year old female who presents with right Achilles tendon pain.  The pain began while navigating stairs and has progressively worsened. It is described as a tugging sensation around the right Achilles tendon, exacerbated by stair navigation, requiring her to descend one step at a time. No prior injuries to the area have been mentioned.  She also experiences chronic left knee pain. She has not been under any specific care for her knee pain recently.     Assessment & Plan: Visit Diagnoses:  1. Pain in left foot   2. Chronic pain of left knee   3. Pain in right foot     Plan: Assessment and Plan Assessment & Plan Right Achilles tendinopathy Right Achilles tendinopathy with tightness and limited dorsiflexion. Pain exacerbated by stair navigation. No need for surgical intervention at this time. - Demonstrated Achilles stretching exercises to perform three to five times a day. - Apply topical Voltaren gel three times a day. - Advise wearing sneakers during stretching exercises. - Follow up in two months.  Bilateral lower extremity varus alignment with antalgic gait Bilateral lower extremity varus alignment with antalgic gait contributing to knee pain and functional limitations. - Monitor alignment and gait in follow-up visits.  Chronic left knee pain with mild osteoarthritis Chronic left knee pain with mild osteoarthritis. Radiographs show  mild medial joint line narrowing and mild spurring in the patella. Varus alignment and antalgic gait noted. - Discussed the importance of strengthening exercises. - Discussed weight loss as a management strategy. - Follow up in two months.      Follow-Up Instructions: No follow-ups on file.   Ortho Exam  Patient is alert, oriented, no adenopathy, well-dressed, normal affect, normal respiratory effort. Physical Exam EXTREMITIES: Dorsalis pedis pulse present bilaterally. MUSCULOSKELETAL: Left foot dorsiflexion 20 degrees past neutral, right foot dorsiflexion 20 degrees short of neutral. Right Achilles tendon tightness with no swelling, nodules, or palpable defects. Varus alignment with antalgic gait bilaterally.      Imaging: No results found. No images are attached to the encounter.  Labs: Lab Results  Component Value Date   HGBA1C 6.1 (H) 08/10/2023   HGBA1C 5.8 (H) 02/12/2023   HGBA1C 5.9 (H) 08/11/2022   ESRSEDRATE 48 (H) 01/02/2015   LABURIC 6.2 11/23/2018   LABURIC 4.8 05/14/2009   LABURIC 4.7 05/06/2009   REPTSTATUS 02/26/2009 FINAL 02/24/2009   CULT  02/24/2009    LACTOBACILLUS SPECIES Note: Standardized susceptibility testing for this organism is not available.   LABORGA Normal Upper Respiratory Flora 09/18/2015   LABORGA No Beta Hemolytic Streptococci Isolated 09/18/2015     Lab Results  Component Value Date   ALBUMIN 3.8 (L) 06/17/2022   ALBUMIN 3.9 09/20/2020   ALBUMIN 3.7  05/25/2018    No results found for: MG Lab Results  Component Value Date   VD25OH 16.9 (L) 09/20/2020   VD25OH 11.3 (L) 09/28/2017    No results found for: PREALBUMIN    Latest Ref Rng & Units 08/10/2023    3:48 PM 02/12/2023    2:59 PM 08/11/2022    3:48 PM  CBC EXTENDED  WBC 3.8 - 10.8 Thousand/uL 11.3  11.9  11.6   RBC 3.80 - 5.10 Million/uL 5.13  5.15  5.09   Hemoglobin 11.7 - 15.5 g/dL 87.7  87.7  88.1   HCT 35.0 - 45.0 % 39.3  38.9  38.4   Platelets 140 - 400  Thousand/uL 312  255  278      There is no height or weight on file to calculate BMI.  Orders:  Orders Placed This Encounter  Procedures   XR Foot 2 Views Left   XR Knee 1-2 Views Left   XR Foot 2 Views Right   No orders of the defined types were placed in this encounter.    Procedures: No procedures performed  Clinical Data: No additional findings.  ROS:  All other systems negative, except as noted in the HPI. Review of Systems  Objective: Vital Signs: There were no vitals taken for this visit.  Specialty Comments:  No specialty comments available.  PMFS History: Patient Active Problem List   Diagnosis Date Noted   GERD (gastroesophageal reflux disease) 08/10/2023   Eczema 08/10/2023   Insomnia 08/11/2022   Prediabetes 12/19/2020   HLD (hyperlipidemia) 12/19/2020   Class 3 severe obesity due to excess calories with body mass index (BMI) of 40.0 to 44.9 in adult [facility]) 12/19/2020   Essential hypertension 10/04/2019   Arthritis of both knees 01/02/2015   Past Medical History:  Diagnosis Date   Anemia    Angio-edema    Arthritis    Back pain    Bilateral swelling of feet and ankles    Constipation    Food allergy     GERD (gastroesophageal reflux disease)    History of blood clots    Hyperlipidemia    Hypertension    Joint pain    Lactose intolerance    Other fatigue    Shortness of breath    Shortness of breath on exertion    Syphilis 07/02/2022   Urticaria    Vitamin D  deficiency     Family History  Problem Relation Age of Onset   Allergic rhinitis Mother    Healthy Mother    Obesity Father    Sleep apnea Father    Heart disease Father    Diabetes Father    Hyperlipidemia Father    Hypertension Father    Heart failure Father    Healthy Sister    Prostate cancer Brother    Heart attack Brother    Arthritis Maternal Grandmother    Heart disease Other    Sleep apnea Other    Obesity Other    Colon cancer Neg Hx    Colon polyps Neg Hx     Esophageal cancer Neg Hx    Rectal cancer Neg Hx    Stomach cancer Neg Hx     Past Surgical History:  Procedure Laterality Date   BREAST BIOPSY Left 2007   BREAST EXCISIONAL BIOPSY     TUBAL LIGATION     Social History   Occupational History   Occupation: Sport and exercise psychologist    Employer: [employer]  Tobacco Use  Smoking status: Never    Passive exposure: Never   Smokeless tobacco: Never  Vaping Use   Vaping status: Never Used  Substance and Sexual Activity   Alcohol use: Yes    Alcohol/week: 0.0 standard drinks of alcohol    Comment: social   Drug use: No   Sexual activity: Yes    Birth control/protection: Surgical, I.U.D.    Comment: tubal ligation

## 2023-12-24 ENCOUNTER — Ambulatory Visit: Admitting: Internal Medicine

## 2024-01-04 ENCOUNTER — Telehealth: Admitting: Family Medicine

## 2024-01-04 ENCOUNTER — Ambulatory Visit: Admitting: Orthopedic Surgery

## 2024-01-04 DIAGNOSIS — J069 Acute upper respiratory infection, unspecified: Secondary | ICD-10-CM | POA: Diagnosis not present

## 2024-01-04 MED ORDER — PROMETHAZINE-DM 6.25-15 MG/5ML PO SYRP: 5.0000 mL | ORAL_SOLUTION | Freq: Four times a day (QID) | ORAL | 0 refills | Status: DC | PRN

## 2024-01-04 MED ORDER — BENZONATATE 100 MG PO CAPS: 200.0000 mg | ORAL_CAPSULE | Freq: Three times a day (TID) | ORAL | 0 refills | Status: DC | PRN

## 2024-01-04 NOTE — Progress Notes (Signed)
 Virtual Visit Consent   Gabriella Riley, you are scheduled for a virtual visit with a [facility] provider today. Just as with appointments in the office, your consent must be obtained to participate. Your consent will be active for this visit and any virtual visit you may have with one of our providers in the next 365 days. If you have a MyChart account, a copy of this consent can be sent to you electronically.  As this is a virtual visit, video technology does not allow for your provider to perform a traditional examination. This may limit your provider's ability to fully assess your condition. If your provider identifies any concerns that need to be evaluated in person or the need to arrange testing (such as labs, EKG, etc.), we will make arrangements to do so. Although advances in technology are sophisticated, we cannot ensure that it will always work on either your end or our end. If the connection with a video visit is poor, the visit may have to be switched to a telephone visit. With either a video or telephone visit, we are not always able to ensure that we have a secure connection.  By engaging in this virtual visit, you consent to the provision of healthcare and authorize for your insurance to be billed (if applicable) for the services provided during this visit. Depending on your insurance coverage, you may receive a charge related to this service.  I need to obtain your verbal consent now. Are you willing to proceed with your visit today? Gabriella Riley has provided verbal consent on 01/04/2024 for a virtual visit (video or telephone). Chiquita CHRISTELLA Barefoot, NP  Date: 01/04/2024 2:31 PM   Virtual Visit via Video Note   I, Chiquita CHRISTELLA Barefoot, connected with  Gabriella Riley  ([id number], 04/06/1975) on 01/04/24 at  2:30 PM EDT by a video-enabled telemedicine application and verified that I am speaking with the correct person using two identifiers.  Location: Patient: Virtual Visit Location  Patient: Home Provider: Virtual Visit Location Provider: Home Office   I discussed the limitations of evaluation and management by telemedicine and the availability of in person appointments. The patient expressed understanding and agreed to proceed.    History of Present Illness: Gabriella Riley is a 48 y.o. who identifies as a female who was assigned female at birth, and is being seen today for cough  Onset was 3 days ago with feeling tired and coughing developed congestion Associated symptoms are fever 101, cough that is causing sore throat Modifying factors are day quil, theraful, mucinex  Denies chest pain, shortness of breath,  Exposure to sick contacts- unknown COVID test: neg  Vaccines: none    Problems:  Patient Active Problem List   Diagnosis Date Noted   GERD (gastroesophageal reflux disease) 08/10/2023   Eczema 08/10/2023   Insomnia 08/11/2022   Prediabetes 12/19/2020   HLD (hyperlipidemia) 12/19/2020   Class 3 severe obesity due to excess calories with body mass index (BMI) of 40.0 to 44.9 in adult [facility]) 12/19/2020   Essential hypertension 10/04/2019   Arthritis of both knees 01/02/2015    Allergies:  Allergies  Allergen Reactions   Shellfish Allergy     Latex Rash   Medications:  Current Outpatient Medications:    acetaminophen  (TYLENOL ) 500 MG tablet, Take 500-1,000 mg by mouth every 6 (six) hours as needed (for pain.)., Disp: , Rfl:    albuterol  (VENTOLIN  HFA) 108 (90 Base) MCG/ACT inhaler, Inhale 2 puffs  into the lungs every 6 (six) hours as needed for wheezing or shortness of breath., Disp: 8 g, Rfl: 0   amoxicillin -clavulanate (AUGMENTIN ) 875-125 MG tablet, Take 1 tablet by mouth 2 (two) times daily., Disp: 14 tablet, Rfl: 0   chlorthalidone  (HYGROTON ) 25 MG tablet, TAKE 1 TABLET (25 MG TOTAL) BY MOUTH DAILY., Disp: 90 tablet, Rfl: 1   citalopram  (CELEXA ) 20 MG tablet, Take 2 tablets (40 mg total) by mouth daily., Disp: 60 tablet, Rfl: 12   famotidine   (PEPCID ) 20 MG tablet, Take 1 tablet (20 mg total) by mouth 2 (two) times daily as needed for heartburn or indigestion., Disp: 60 tablet, Rfl: 5   fexofenadine  (ALLEGRA  ALLERGY ) 180 MG tablet, Take 1 tablet (180 mg total) by mouth 2 (two) times daily as needed., Disp: 60 tablet, Rfl: 5   fluconazole  (DIFLUCAN ) 150 MG tablet, Take 1 tablet (150 mg total) by mouth every 3 (three) days as needed., Disp: 2 tablet, Rfl: 0   fluticasone  (FLONASE ) 50 MCG/ACT nasal spray, Place 2 sprays into both nostrils daily., Disp: 16 g, Rfl: 5   Multiple Vitamins-Minerals (ADULT GUMMY PO), Take 2 tablets by mouth daily., Disp: , Rfl:    simvastatin  (ZOCOR ) 40 MG tablet, Take 1 tablet (40 mg total) by mouth at bedtime., Disp: 90 tablet, Rfl: 1   Vitamin D , Ergocalciferol , (DRISDOL ) 1.25 MG (50000 UNIT) CAPS capsule, TAKE 1 CAPSULE (50,000 UNITS TOTAL) BY MOUTH EVERY 7 (SEVEN) DAYS, Disp: 12 capsule, Rfl: 0  Observations/Objective: Patient is well-developed, well-nourished in no acute distress.  Resting comfortably  at home.  Head is normocephalic, atraumatic.  No labored breathing.  Speech is clear and coherent with logical content.  Patient is alert and oriented at baseline.    Assessment and Plan:  1. Viral URI with cough (Primary)  - promethazine-dextromethorphan (PROMETHAZINE-DM) 6.25-15 MG/5ML syrup; Take 5 mLs by mouth 4 (four) times daily as needed for cough.  Dispense: 118 mL; Refill: 0 - benzonatate  (TESSALON ) 100 MG capsule; Take 2 capsules (200 mg total) by mouth 3 (three) times daily as needed for cough.  Dispense: 30 capsule; Refill: 0   URI recommendations: - Increased rest - Increasing Fluids - Acetaminophen  / ibuprofen  as needed for fever/pain.  - Salt water gargling, chloraseptic spray and throat lozenges - Mucinex if mucus is present and increasing.  - Saline nasal spray if congestion or if nasal passages feel dry. - Humidifying the air.    Reviewed side effects, risks and benefits of  medication.    Patient acknowledged agreement and understanding of the plan.   Past Medical, Surgical, Social History, Allergies, and Medications have been Reviewed.    Follow Up Instructions: I discussed the assessment and treatment plan with the patient. The patient was provided an opportunity to ask questions and all were answered. The patient agreed with the plan and demonstrated an understanding of the instructions.  A copy of instructions were sent to the patient via MyChart unless otherwise noted below.    The patient was advised to call back or seek an in-person evaluation if the symptoms worsen or if the condition fails to improve as anticipated.    Chiquita CHRISTELLA Barefoot, NP

## 2024-01-04 NOTE — Patient Instructions (Signed)
 Gabriella Riley, thank you for joining Gabriella CHRISTELLA Barefoot, NP for today's virtual visit.  While this provider is not your primary care provider (PCP), if your PCP is located in our provider database this encounter information will be shared with them immediately following your visit.   A [facility] MyChart account gives you access to today's visit and all your visits, tests, and labs performed at [facility]  click here if you don't have a [facility] MyChart account or go to [email or web address]  Consent: (Patient) Gabriella Riley provided verbal consent for this virtual visit at the beginning of the encounter.  Current Medications:  Current Outpatient Medications:    benzonatate  (TESSALON ) 100 MG capsule, Take 2 capsules (200 mg total) by mouth 3 (three) times daily as needed for cough., Disp: 30 capsule, Rfl: 0   promethazine-dextromethorphan (PROMETHAZINE-DM) 6.25-15 MG/5ML syrup, Take 5 mLs by mouth 4 (four) times daily as needed for cough., Disp: 118 mL, Rfl: 0   acetaminophen  (TYLENOL ) 500 MG tablet, Take 500-1,000 mg by mouth every 6 (six) hours as needed (for pain.)., Disp: , Rfl:    albuterol  (VENTOLIN  HFA) 108 (90 Base) MCG/ACT inhaler, Inhale 2 puffs into the lungs every 6 (six) hours as needed for wheezing or shortness of breath., Disp: 8 g, Rfl: 0   amoxicillin -clavulanate (AUGMENTIN ) 875-125 MG tablet, Take 1 tablet by mouth 2 (two) times daily., Disp: 14 tablet, Rfl: 0   chlorthalidone  (HYGROTON ) 25 MG tablet, TAKE 1 TABLET (25 MG TOTAL) BY MOUTH DAILY., Disp: 90 tablet, Rfl: 1   citalopram  (CELEXA ) 20 MG tablet, Take 2 tablets (40 mg total) by mouth daily., Disp: 60 tablet, Rfl: 12   famotidine  (PEPCID ) 20 MG tablet, Take 1 tablet (20 mg total) by mouth 2 (two) times daily as needed for heartburn or indigestion., Disp: 60 tablet, Rfl: 5   fexofenadine  (ALLEGRA  ALLERGY ) 180 MG tablet, Take 1 tablet (180 mg total) by mouth 2 (two) times daily as needed., Disp:  60 tablet, Rfl: 5   fluconazole  (DIFLUCAN ) 150 MG tablet, Take 1 tablet (150 mg total) by mouth every 3 (three) days as needed., Disp: 2 tablet, Rfl: 0   fluticasone  (FLONASE ) 50 MCG/ACT nasal spray, Place 2 sprays into both nostrils daily., Disp: 16 g, Rfl: 5   Multiple Vitamins-Minerals (ADULT GUMMY PO), Take 2 tablets by mouth daily., Disp: , Rfl:    simvastatin  (ZOCOR ) 40 MG tablet, Take 1 tablet (40 mg total) by mouth at bedtime., Disp: 90 tablet, Rfl: 1   Vitamin D , Ergocalciferol , (DRISDOL ) 1.25 MG (50000 UNIT) CAPS capsule, TAKE 1 CAPSULE (50,000 UNITS TOTAL) BY MOUTH EVERY 7 (SEVEN) DAYS, Disp: 12 capsule, Rfl: 0   Medications ordered in this encounter:  Meds ordered this encounter  Medications   promethazine-dextromethorphan (PROMETHAZINE-DM) 6.25-15 MG/5ML syrup    Sig: Take 5 mLs by mouth 4 (four) times daily as needed for cough.    Dispense:  118 mL    Refill:  0    Supervising Provider:   BLAISE ALEENE KIDD [id number]   benzonatate  (TESSALON ) 100 MG capsule    Sig: Take 2 capsules (200 mg total) by mouth 3 (three) times daily as needed for cough.    Dispense:  30 capsule    Refill:  0    Supervising Provider:   BLAISE ALEENE KIDD [[id number]]     *If you need refills on other medications prior to your next appointment, please contact your pharmacy*  Follow-Up: Call back or seek  an in-person evaluation if the symptoms worsen or if the condition fails to improve as anticipated.  [facility] [phone]  Other Instructions  URI recommendations: - Increased rest - Increasing Fluids - Acetaminophen  / ibuprofen  as needed for fever/pain.  - Salt water gargling, chloraseptic spray and throat lozenges - Mucinex if mucus is present and increasing.  - Saline nasal spray if congestion or if nasal passages feel dry. - Humidifying the air.      If you have been instructed to have an in-person evaluation today at a local Urgent Care facility, please use the link  below. It will take you to a list of all of our available [facility], including address, phone number and hours of operation. Please do not delay care.  [facility]  If you or a family member do not have a primary care provider, use the link below to schedule a visit and establish care. When you choose a [facility] primary care physician or advanced practice provider, you gain a long-term partner in health. Find a Primary Care Provider  Learn more about [facility] in-office and virtual care options: [facility] - Get Care Now

## 2024-01-19 ENCOUNTER — Encounter (INDEPENDENT_AMBULATORY_CARE_PROVIDER_SITE_OTHER): Payer: Self-pay

## 2024-01-20 DIAGNOSIS — Z0289 Encounter for other administrative examinations: Secondary | ICD-10-CM

## 2024-01-23 ENCOUNTER — Other Ambulatory Visit: Payer: Self-pay | Admitting: Obstetrics & Gynecology

## 2024-01-23 DIAGNOSIS — N951 Menopausal and female climacteric states: Secondary | ICD-10-CM

## 2024-01-25 ENCOUNTER — Encounter: Payer: Self-pay | Admitting: Radiology

## 2024-02-02 ENCOUNTER — Encounter (INDEPENDENT_AMBULATORY_CARE_PROVIDER_SITE_OTHER): Payer: Self-pay | Admitting: Family Medicine

## 2024-02-02 ENCOUNTER — Ambulatory Visit (INDEPENDENT_AMBULATORY_CARE_PROVIDER_SITE_OTHER): Admitting: Family Medicine

## 2024-02-02 VITALS — BP 113/78 | HR 69 | Temp 98.1°F | Ht 67.0 in | Wt 282.0 lb

## 2024-02-02 DIAGNOSIS — R5383 Other fatigue: Secondary | ICD-10-CM | POA: Diagnosis not present

## 2024-02-02 DIAGNOSIS — E7800 Pure hypercholesterolemia, unspecified: Secondary | ICD-10-CM

## 2024-02-02 DIAGNOSIS — R7303 Prediabetes: Secondary | ICD-10-CM

## 2024-02-02 DIAGNOSIS — Z8759 Personal history of other complications of pregnancy, childbirth and the puerperium: Secondary | ICD-10-CM

## 2024-02-02 DIAGNOSIS — D509 Iron deficiency anemia, unspecified: Secondary | ICD-10-CM

## 2024-02-02 DIAGNOSIS — R0602 Shortness of breath: Secondary | ICD-10-CM

## 2024-02-02 DIAGNOSIS — E559 Vitamin D deficiency, unspecified: Secondary | ICD-10-CM | POA: Diagnosis not present

## 2024-02-02 DIAGNOSIS — Z6841 Body Mass Index (BMI) 40.0 and over, adult: Secondary | ICD-10-CM

## 2024-02-02 DIAGNOSIS — Z1331 Encounter for screening for depression: Secondary | ICD-10-CM | POA: Diagnosis not present

## 2024-02-02 NOTE — Assessment & Plan Note (Signed)
 Elevated LDL previously in May.  She is on simvastatin  daily and remembers it daily.  Will repeat LDL and discuss results at next appointment- will place into ASCVD risk calculator.  If still elevated will refer to lipid clinic.

## 2024-02-02 NOTE — Assessment & Plan Note (Signed)
 Patient has history of prediabetes with most recent A1c of 6.1.  She is ready to implement dietary changes to limit simple carbohydrates and control intake of sugar sweetened beverages.  Will need an A1c, Insulin and CMP level today.

## 2024-02-02 NOTE — Progress Notes (Signed)
 Chief Complaint:  Obesity   Subjective:  Gabriella Riley (MR# [id number]) is a 48 y.o. female who presents for evaluation and treatment of obesity and related comorbidities.   Gabriella Riley is currently in the action stage of change and ready to dedicate time achieving and maintaining a healthier weight. Gabriella Riley is interested in becoming our patient and working on intensive lifestyle modifications including (but not limited to) diet and exercise for weight loss.  Gabriella Riley has been struggling with her weight. She has been unsuccessful in either losing weight, maintaining weight loss, or reaching her healthy weight goal.  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  Hours are 7-4 M-F.  She lives at home with husband Will, Gaffer (17),  daughter Golden (13), son Ole [phone]).  Family is supportive of her, sometimes they eat meals together but she is not expecting anyone to change how they eat with her.  Desired weight is 180lbs- last time she was that weight was 24 years ago.   Lost her pregnancy weight in the first two pregnancies but the second two was harder to lose. Previously on Mounjaro  and lost 20lbs prior to discontinuation.  Eats fast food or take out 5-6 times a week- will eat burgers, chicken nuggets, salad, pizza, steak. She and her husband both cook but he does most of the cooking.  Snacks on chips, cookies, popcorn and crackers.  Skips breakfast due to schedule.  Occasionally skips lunch due to lack of desire to eat.  Food Recall: If she eats breakfast will eat country ham and cheese breakfast with orange juice from [employer]- feels satisfied.  In between she may have some chips or doritos- feels it is a habit unless she doesn't eat breakfast.  Lunch could be leftover from home or pizza and salad from [employer] (eat all and feel satisfied) or [employer] nuggets, fries and soft drink (Coke)- eats all and feels satisfied.  Isn't hungry in afternoon.  Dinner is often  food that her son will eat as he is picky and only eats chicken, pizza, burger, fried pork chop, spaghetti.  For spaghetti- will have green peppers and onions and ground beef or ground sausage and tomato sauce with cheese she will eat plate and a bowl of salad- feels full.     Indirect Calorimeter completed today shows a RMR: 1958.  Her calculated basal metabolic rate is 7997 thus her basal metabolic rate is worse than expected.  This is lower than it was in 2022 when her RMR was found to be 2020.  Other Fatigue Gabriella Riley admits to daytime somnolence and admits to waking up still tired. Patient has a history of symptoms of morning headache. Gabriella Riley generally gets 4 or 5 hours of sleep per night, and states that she has generally restless sleep. Snoring is present. Apneic episodes are not present. Epworth Sleepiness Score is 8.   Shortness of Breath Gabriella Riley notes increasing shortness of breath with exercising and seems to be worsening over time with weight gain. She notes getting out of breath sooner with activity than she used to. This has not gotten worse recently. Gabriella Riley denies shortness of breath at rest or orthopnea.  Depression Screen Gabriella Riley's Food and Mood (modified PHQ-9) score was 13.     02/02/2024    7:34 AM  Depression screen PHQ 2/9  Decreased Interest 0  Down, Depressed, Hopeless 1  PHQ - 2 Score 1  Altered sleeping 3  Tired, decreased energy 3  Change in appetite  3  Feeling bad or failure about yourself  1  Trouble concentrating 0  Moving slowly or fidgety/restless 0  Suicidal thoughts 0  PHQ-9 Score 11  Difficult doing work/chores Not difficult at all     Objective:  Vitals Temp: 98.1 F (36.7 C) BP: 113/78 Pulse Rate: 69 SpO2: 92 %   Anthropometric Measurements Height: 5' 7 (1.702 m) Weight: 282 lb (127.9 kg) BMI (Calculated): 44.16 Weight at Last Visit: 250 lb Weight Lost Since Last Visit: 0 Weight Gained Since Last Visit: 32 LB Starting Weight: 270  lb Waist Measurement : 54 inches   Body Composition  Body Fat %: 50.8 % Fat Mass (lbs): 143.4 lbs Muscle Mass (lbs): 1231.8 lbs Total Body Water (lbs): 98.4 lbs Visceral Fat Rating : 16   Other Clinical Data RMR: 1958 Fasting: yes Labs: yes Today's Visit #: 16 Starting Date: 09/20/20 Comments: restart    EKG: Normal sinus rhythm, rate 67.  General: Cooperative, alert, well developed, in no acute distress. HEENT: Conjunctivae and lids unremarkable. Cardiovascular: Regular rhythm.  Lungs: Normal work of breathing. Neurologic: No focal deficits.   Lab Results  Component Value Date   CREATININE 1.15 (H) 08/10/2023   BUN 19 08/10/2023   NA 140 08/10/2023   K 3.6 08/10/2023   CL 102 08/10/2023   CO2 31 08/10/2023   Lab Results  Component Value Date   ALT 13 08/10/2023   AST 14 08/10/2023   ALKPHOS 93 06/17/2022   BILITOT 0.4 08/10/2023   Lab Results  Component Value Date   HGBA1C 6.1 (H) 08/10/2023   HGBA1C 5.8 (H) 02/12/2023   HGBA1C 5.9 (H) 08/11/2022   HGBA1C 5.7 (H) 01/31/2022   HGBA1C 5.9 (H) 09/20/2020   Lab Results  Component Value Date   INSULIN 11.1 09/20/2020   Lab Results  Component Value Date   TSH 1.000 06/17/2022   Lab Results  Component Value Date   CHOL 174 08/10/2023   HDL 46 (L) 08/10/2023   LDLCALC 109 (H) 08/10/2023   TRIG 94 08/10/2023   CHOLHDL 3.8 08/10/2023   Lab Results  Component Value Date   WBC 11.3 (H) 08/10/2023   HGB 12.2 08/10/2023   HCT 39.3 08/10/2023   MCV 76.6 (L) 08/10/2023   PLT 312 08/10/2023   Lab Results  Component Value Date   IRON 25 (L) 09/20/2020   TIBC 335 09/20/2020   FERRITIN 34 09/20/2020    Assessment and Plan:  Assessment & Plan Other fatigue  SOBOE (shortness of breath on exertion)  Depression screening  Iron deficiency anemia, unspecified iron deficiency anemia type Not on consistent iron replacement- forgets sometimes.  Will order CBC and Anemia Panel today.   Prediabetes Patient has history of prediabetes with most recent A1c of 6.1.  She is ready to implement dietary changes to limit simple carbohydrates and control intake of sugar sweetened beverages.  Will need an A1c, Insulin and CMP level today. Pure hypercholesterolemia Elevated LDL previously in May.  She is on simvastatin  daily and remembers it daily.  Will repeat LDL and discuss results at next appointment- will place into ASCVD risk calculator.  If still elevated will refer to lipid clinic. Vitamin D  deficiency Historical diagnosis.  Needs a repeat Vitamin D  level today. H/O pre-eclampsia Term pre-eclampsia that did not require magnesium.  Patient is on blood pressure medication but is well controlled.  Continue to monitor BP at subsequent appointments.  BMI 40.0-44.9, adult [facility])  Morbid obesity (HCC)    Other  Fatigue  Gabriella Riley does feel that her weight is causing her energy to be lower than it should be. Fatigue may be related to obesity, depression or many other causes. Labs will be ordered, and in the meanwhile, Gabriella Riley will focus on self care including making healthy food choices, increasing physical activity and focusing on stress reduction.  Shortness of Breath  Gabriella Riley does feel that she gets out of breath more easily that she used to when she exercises. Gabriella Riley's shortness of breath appears to be obesity related and exercise induced. She has agreed to work on weight loss and gradually increase exercise to treat her exercise induced shortness of breath. Will continue to monitor closely.   Problem List Items Addressed This Visit   None Visit Diagnoses       Other fatigue    -  Primary   Relevant Orders   EKG 12-Lead     SOBOE (shortness of breath on exertion)       Relevant Orders   EKG 12-Lead     Depression screening         BMI 40.0-44.9, adult (HCC)         Morbid obesity (HCC)           Gabriella Riley is currently in the action stage of change and her goal is to continue  with weight loss efforts. I recommend Loletha begin the structured treatment plan as follows:  She has agreed to Category 3 Plan and keeping a food journal and adhering to recommended goals of 1450-1600 calories and 95 or more grams of protein  Exercise goals: No exercise has been prescribed at this time.  Behavioral modification strategies:increasing lean protein intake, decreasing simple carbohydrates, increasing vegetables, meal planning and cooking strategies, planning for success, and keep a strict food journal  She was informed of the importance of frequent follow-up visits to maximize her success with intensive lifestyle modifications for her multiple health conditions. She was informed we would discuss her lab results at her next visit unless there is a critical issue that needs to be addressed sooner. Gabriella Riley agreed to keep her next visit at the agreed upon time to discuss these results.  Labs ordered with plans to discuss at the next visit.   Attestation Statements:  Reviewed by clinician on day of visit: allergies, medications, problem list, medical history, surgical history, family history, social history, and previous encounter notes.  This is the patient's first visit at [facility]. The patient's NEW PATIENT PACKET was reviewed at length. Included in the packet: current and past health history, medications, allergies, ROS, gynecologic history (women only), surgical history, family history, social history, weight history, weight loss surgery history (for those that have had weight loss surgery), nutritional evaluation, mood and food questionnaire, PHQ9, Epworth questionnaire, sleep habits questionnaire, patient life and health improvement goals questionnaire. These will all be scanned into the patient's chart under media.   During the visit, I independently reviewed the patient's EKG, bioimpedance scale results, and indirect calorimeter results. I used this information  to tailor a meal plan for the patient that will help her to lose weight and will improve her obesity-related conditions going forward. I performed a medically necessary appropriate examination and/or evaluation. I discussed the assessment and treatment plan with the patient. The patient was provided an opportunity to ask questions and all were answered. The patient agreed with the plan and demonstrated an understanding of the instructions. Labs were ordered at this visit and will be reviewed  at the next visit unless more critical results need to be addressed immediately. Clinical information was updated and documented in the EMR.     Gabriella Riley Cho, MD

## 2024-02-03 ENCOUNTER — Ambulatory Visit: Admitting: Family Medicine

## 2024-02-03 ENCOUNTER — Encounter: Payer: Self-pay | Admitting: Family Medicine

## 2024-02-03 VITALS — BP 122/82 | HR 78 | Ht 67.0 in | Wt 284.0 lb

## 2024-02-03 DIAGNOSIS — M25561 Pain in right knee: Secondary | ICD-10-CM | POA: Diagnosis not present

## 2024-02-03 DIAGNOSIS — E785 Hyperlipidemia, unspecified: Secondary | ICD-10-CM

## 2024-02-03 DIAGNOSIS — M25562 Pain in left knee: Secondary | ICD-10-CM

## 2024-02-03 DIAGNOSIS — Z6841 Body Mass Index (BMI) 40.0 and over, adult: Secondary | ICD-10-CM

## 2024-02-03 DIAGNOSIS — E559 Vitamin D deficiency, unspecified: Secondary | ICD-10-CM

## 2024-02-03 DIAGNOSIS — E66813 Obesity, class 3: Secondary | ICD-10-CM

## 2024-02-03 DIAGNOSIS — I10 Essential (primary) hypertension: Secondary | ICD-10-CM

## 2024-02-03 DIAGNOSIS — G8929 Other chronic pain: Secondary | ICD-10-CM

## 2024-02-03 LAB — CBC WITH DIFFERENTIAL/PLATELET
Basophils Absolute: 0 x10E3/uL (ref 0.0–0.2)
Basos: 0 %
EOS (ABSOLUTE): 0.2 x10E3/uL (ref 0.0–0.4)
Eos: 2 %
Hemoglobin: 12.3 g/dL (ref 11.1–15.9)
Immature Grans (Abs): 0 x10E3/uL (ref 0.0–0.1)
Immature Granulocytes: 0 %
Lymphocytes Absolute: 2.2 x10E3/uL (ref 0.7–3.1)
Lymphs: 23 %
MCH: 23.8 pg — ABNORMAL LOW (ref 26.6–33.0)
MCHC: 30.5 g/dL — ABNORMAL LOW (ref 31.5–35.7)
MCV: 78 fL — ABNORMAL LOW (ref 79–97)
Monocytes Absolute: 0.4 x10E3/uL (ref 0.1–0.9)
Monocytes: 4 %
Neutrophils Absolute: 6.8 x10E3/uL (ref 1.4–7.0)
Neutrophils: 71 %
Platelets: 270 x10E3/uL (ref 150–450)
RBC: 5.16 x10E6/uL (ref 3.77–5.28)
RDW: 14.5 % (ref 11.7–15.4)
WBC: 9.6 x10E3/uL (ref 3.4–10.8)

## 2024-02-03 LAB — HEMOGLOBIN A1C
Est. average glucose Bld gHb Est-mCnc: 123 mg/dL
Hgb A1c MFr Bld: 5.9 % — ABNORMAL HIGH (ref 4.8–5.6)

## 2024-02-03 LAB — COMPREHENSIVE METABOLIC PANEL WITH GFR
ALT: 13 IU/L (ref 0–32)
AST: 13 IU/L (ref 0–40)
Albumin: 3.7 g/dL — ABNORMAL LOW (ref 3.9–4.9)
Alkaline Phosphatase: 85 IU/L (ref 41–116)
BUN/Creatinine Ratio: 14 (ref 9–23)
BUN: 15 mg/dL (ref 6–24)
Bilirubin Total: 0.5 mg/dL (ref 0.0–1.2)
CO2: 25 mmol/L (ref 20–29)
Calcium: 9.1 mg/dL (ref 8.7–10.2)
Chloride: 100 mmol/L (ref 96–106)
Creatinine, Ser: 1.04 mg/dL — ABNORMAL HIGH (ref 0.57–1.00)
Globulin, Total: 2.6 g/dL (ref 1.5–4.5)
Glucose: 83 mg/dL (ref 70–99)
Potassium: 3.8 mmol/L (ref 3.5–5.2)
Sodium: 139 mmol/L (ref 134–144)
Total Protein: 6.3 g/dL (ref 6.0–8.5)
eGFR: 67 mL/min/1.73 (ref >59)

## 2024-02-03 LAB — T4, FREE: Free T4: 1.23 ng/dL (ref 0.82–1.77)

## 2024-02-03 LAB — ANEMIA PANEL
Ferritin: 95 ng/mL (ref 15–150)
Folate, Hemolysate: 302 ng/mL
Folate, RBC: 749 ng/mL (ref >498)
Hematocrit: 40.3 % (ref 34.0–46.6)
Iron Saturation: 19 % (ref 15–55)
Iron: 63 ug/dL (ref 27–159)
Retic Ct Pct: 1.5 % (ref 0.6–2.6)
Total Iron Binding Capacity: 325 ug/dL (ref 250–450)
UIBC: 262 ug/dL (ref 131–425)
Vitamin B-12: 511 pg/mL (ref 232–1245)

## 2024-02-03 LAB — LIPID PANEL WITH LDL/HDL RATIO
Cholesterol, Total: 160 mg/dL (ref 100–199)
HDL: 48 mg/dL (ref >39)
LDL Chol Calc (NIH): 98 mg/dL (ref 0–99)
LDL/HDL Ratio: 2 ratio (ref 0.0–3.2)
Triglycerides: 74 mg/dL (ref 0–149)
VLDL Cholesterol Cal: 14 mg/dL (ref 5–40)

## 2024-02-03 LAB — FOLATE: Folate: 4.2 ng/mL (ref >3.0)

## 2024-02-03 LAB — T3: T3, Total: 141 ng/dL (ref 71–180)

## 2024-02-03 LAB — INSULIN, RANDOM: INSULIN: 14.6 u[IU]/mL (ref 2.6–24.9)

## 2024-02-03 LAB — VITAMIN D 25 HYDROXY (VIT D DEFICIENCY, FRACTURES): Vit D, 25-Hydroxy: 23 ng/mL — ABNORMAL LOW (ref 30.0–100.0)

## 2024-02-03 LAB — TSH: TSH: 1.14 u[IU]/mL (ref 0.450–4.500)

## 2024-02-03 MED ORDER — VITAMIN D (ERGOCALCIFEROL) 1.25 MG (50000 UNIT) PO CAPS
50000.0000 [IU] | ORAL_CAPSULE | ORAL | 0 refills | Status: AC
Start: 2024-02-03 — End: ?

## 2024-02-03 MED ORDER — DICLOFENAC SODIUM 1 % EX GEL: 4.0000 g | Freq: Four times a day (QID) | CUTANEOUS | 1 refills | Status: AC

## 2024-02-03 NOTE — Progress Notes (Deleted)
 Established Patient Office Visit  Subjective    Patient ID: Gabriella Riley, female    DOB: 05-31-1975  Age: 48 y.o. MRN: [id number]  CC:  Chief Complaint  Patient presents with   Establish Care    Pt reports she has been feeling lught headed recently with fluctuating BP.  Also reports lower left back pain -Has been told in the past her kidney function is low. Reports she has anemia and left knee pain that is not going away.     HPI Gabriella Riley presents ***  Outpatient Encounter Medications as of 02/03/2024  Medication Sig   acetaminophen  (TYLENOL ) 500 MG tablet Take 500-1,000 mg by mouth every 6 (six) hours as needed (for pain.).   albuterol  (VENTOLIN  HFA) 108 (90 Base) MCG/ACT inhaler Inhale 2 puffs into the lungs every 6 (six) hours as needed for wheezing or shortness of breath.   chlorthalidone  (HYGROTON ) 25 MG tablet TAKE 1 TABLET (25 MG TOTAL) BY MOUTH DAILY.   famotidine  (PEPCID ) 20 MG tablet Take 1 tablet (20 mg total) by mouth 2 (two) times daily as needed for heartburn or indigestion.   fexofenadine  (ALLEGRA  ALLERGY ) 180 MG tablet Take 1 tablet (180 mg total) by mouth 2 (two) times daily as needed.   fluticasone  (FLONASE ) 50 MCG/ACT nasal spray Place 2 sprays into both nostrils daily.   Multiple Vitamins-Minerals (ADULT GUMMY PO) Take 2 tablets by mouth daily.   simvastatin  (ZOCOR ) 40 MG tablet Take 1 tablet (40 mg total) by mouth at bedtime.   citalopram  (CELEXA ) 20 MG tablet TAKE 2 TABLETS (40 MG TOTAL) BY MOUTH DAILY. (Patient not taking: Reported on 02/03/2024)   No facility-administered encounter medications on file as of 02/03/2024.    Past Medical History:  Diagnosis Date   Anemia    Angio-edema    Arthritis    Back pain    Bilateral swelling of feet and ankles    Constipation    Food allergy     GERD (gastroesophageal reflux disease)    History of blood clots    Hyperlipidemia    Hypertension    Joint pain    Kidney problem    Lactose  intolerance    Other fatigue    Prediabetes    Shortness of breath    Shortness of breath on exertion    Syphilis 07/02/2022   Urticaria    Vitamin D  deficiency     Past Surgical History:  Procedure Laterality Date   BREAST BIOPSY Left 2007   BREAST EXCISIONAL BIOPSY     TUBAL LIGATION      Family History  Problem Relation Age of Onset   Hypertension Mother    Allergic rhinitis Mother    Obesity Father    Sleep apnea Father    Heart disease Father    Diabetes Father    Hyperlipidemia Father    Hypertension Father    Heart failure Father    Healthy Sister    Prostate cancer Brother    Heart attack Brother    Arthritis Maternal Grandmother    Heart disease Other    Sleep apnea Other    Obesity Other    Colon cancer Neg Hx    Colon polyps Neg Hx    Esophageal cancer Neg Hx    Rectal cancer Neg Hx    Stomach cancer Neg Hx     Social History   Socioeconomic History   Marital status: Married    Spouse name: Will  Number of children: 4   Years of education: Not on file   Highest education level: Doctorate  Occupational History   Occupation: Research Scientist (physical Sciences): [employer]   Occupation: Tax Inspector  Tobacco Use   Smoking status: Never    Passive exposure: Never   Smokeless tobacco: Never  Vaping Use   Vaping status: Never Used  Substance and Sexual Activity   Alcohol use: Yes    Alcohol/week: 0.0 standard drinks of alcohol    Comment: social   Drug use: No   Sexual activity: Yes    Birth control/protection: Surgical, I.U.D.    Comment: tubal ligation   Other Topics Concern   Not on file  Social History Narrative   Not on file   Social Drivers of Health   Financial Resource Strain: Low Risk  (02/02/2024)   Overall Financial Resource Strain (CARDIA)    Difficulty of Paying Living Expenses: Not hard at all  Food Insecurity: No Food Insecurity (02/02/2024)   Hunger Vital Sign    Worried About Running Out of  Food in the Last Year: Never true    Ran Out of Food in the Last Year: Never true  Transportation Needs: No Transportation Needs (02/02/2024)   PRAPARE - Administrator, Civil Service (Medical): No    Lack of Transportation (Non-Medical): No  Physical Activity: Inactive (02/02/2024)   Exercise Vital Sign    Days of Exercise per Week: 0 days    Minutes of Exercise per Session: Not on file  Stress: Stress Concern Present (02/02/2024)   [employer] of Occupational Health - Occupational Stress Questionnaire    Feeling of Stress: To some extent  Social Connections: Moderately Integrated (02/02/2024)   Social Connection and Isolation Panel    Frequency of Communication with Friends and Family: More than three times a week    Frequency of Social Gatherings with Friends and Family: Once a week    Attends Religious Services: More than 4 times per year    Active Member of [employer] or Organizations: No    Attends Engineer, Structural: Not on file    Marital Status: Married  Catering Manager Violence: Not At Risk (02/12/2023)   Humiliation, Afraid, Rape, and Kick questionnaire    Fear of Current or Ex-Partner: No    Emotionally Abused: No    Physically Abused: No    Sexually Abused: No    ROS      Objective    BP 122/82   Pulse 78   Ht 5' 7 (1.702 m)   Wt 284 lb (128.8 kg)   LMP 01/16/2024   SpO2 94%   BMI 44.48 kg/m   Physical Exam  {Labs (Optional):23779}    Assessment & Plan:   There are no diagnoses linked to this encounter.   No follow-ups on file.   Tanda Raguel SQUIBB, MD

## 2024-02-08 NOTE — Progress Notes (Signed)
 New Patient Office Visit  Subjective    Patient ID: Gabriella Riley, female    DOB: 1976-01-18  Age: 48 y.o. MRN: [id number]  CC:  Chief Complaint  Patient presents with   Establish Care    Pt reports she has been feeling lught headed recently with fluctuating BP.  Also reports lower left back pain -Has been told in the past her kidney function is low. Reports she has anemia and left knee pain that is not going away.     HPI Gabriella Riley presents to establish care and for review of chronic med issues that include bilateral keen pain and hypertension. Patient reports med compliance.    Outpatient Encounter Medications as of 02/03/2024  Medication Sig   acetaminophen  (TYLENOL ) 500 MG tablet Take 500-1,000 mg by mouth every 6 (six) hours as needed (for pain.).   albuterol  (VENTOLIN  HFA) 108 (90 Base) MCG/ACT inhaler Inhale 2 puffs into the lungs every 6 (six) hours as needed for wheezing or shortness of breath.   chlorthalidone  (HYGROTON ) 25 MG tablet TAKE 1 TABLET (25 MG TOTAL) BY MOUTH DAILY.   diclofenac Sodium (VOLTAREN) 1 % GEL Apply 4 g topically 4 (four) times daily.   famotidine  (PEPCID ) 20 MG tablet Take 1 tablet (20 mg total) by mouth 2 (two) times daily as needed for heartburn or indigestion.   fexofenadine  (ALLEGRA  ALLERGY ) 180 MG tablet Take 1 tablet (180 mg total) by mouth 2 (two) times daily as needed.   fluticasone  (FLONASE ) 50 MCG/ACT nasal spray Place 2 sprays into both nostrils daily.   Multiple Vitamins-Minerals (ADULT GUMMY PO) Take 2 tablets by mouth daily.   simvastatin  (ZOCOR ) 40 MG tablet Take 1 tablet (40 mg total) by mouth at bedtime.   Vitamin D , Ergocalciferol , (DRISDOL ) 1.25 MG (50000 UNIT) CAPS capsule Take 1 capsule (50,000 Units total) by mouth every 7 (seven) days.   citalopram  (CELEXA ) 20 MG tablet TAKE 2 TABLETS (40 MG TOTAL) BY MOUTH DAILY. (Patient not taking: Reported on 02/03/2024)   No facility-administered encounter medications on file  as of 02/03/2024.    Past Medical History:  Diagnosis Date   Anemia    Angio-edema    Arthritis    Back pain    Bilateral swelling of feet and ankles    Constipation    Food allergy     GERD (gastroesophageal reflux disease)    History of blood clots    Hyperlipidemia    Hypertension    Joint pain    Kidney problem    Lactose intolerance    Other fatigue    Prediabetes    Shortness of breath    Shortness of breath on exertion    Syphilis 07/02/2022   Urticaria    Vitamin D  deficiency     Past Surgical History:  Procedure Laterality Date   BREAST BIOPSY Left 2007   BREAST EXCISIONAL BIOPSY     TUBAL LIGATION      Family History  Problem Relation Age of Onset   Hypertension Mother    Allergic rhinitis Mother    Obesity Father    Sleep apnea Father    Heart disease Father    Diabetes Father    Hyperlipidemia Father    Hypertension Father    Heart failure Father    Healthy Sister    Prostate cancer Brother    Heart attack Brother    Arthritis Maternal Grandmother    Heart disease Other    Sleep apnea  Other    Obesity Other    Colon cancer Neg Hx    Colon polyps Neg Hx    Esophageal cancer Neg Hx    Rectal cancer Neg Hx    Stomach cancer Neg Hx     Social History   Socioeconomic History   Marital status: Married    Spouse name: Will   Number of children: 4   Years of education: Not on file   Highest education level: Doctorate  Occupational History   Occupation: Research Scientist (physical Sciences): [employer]   Occupation: Tax Inspector  Tobacco Use   Smoking status: Never    Passive exposure: Never   Smokeless tobacco: Never  Vaping Use   Vaping status: Never Used  Substance and Sexual Activity   Alcohol use: Yes    Alcohol/week: 0.0 standard drinks of alcohol    Comment: social   Drug use: No   Sexual activity: Yes    Birth control/protection: Surgical, I.U.D.    Comment: tubal ligation   Other Topics Concern    Not on file  Social History Narrative   Not on file   Social Drivers of Health   Financial Resource Strain: Low Risk  (02/02/2024)   Overall Financial Resource Strain (CARDIA)    Difficulty of Paying Living Expenses: Not hard at all  Food Insecurity: No Food Insecurity (02/02/2024)   Hunger Vital Sign    Worried About Running Out of Food in the Last Year: Never true    Ran Out of Food in the Last Year: Never true  Transportation Needs: No Transportation Needs (02/02/2024)   PRAPARE - Administrator, Civil Service (Medical): No    Lack of Transportation (Non-Medical): No  Physical Activity: Inactive (02/02/2024)   Exercise Vital Sign    Days of Exercise per Week: 0 days    Minutes of Exercise per Session: Not on file  Stress: Stress Concern Present (02/02/2024)   [employer] of Occupational Health - Occupational Stress Questionnaire    Feeling of Stress: To some extent  Social Connections: Moderately Integrated (02/02/2024)   Social Connection and Isolation Panel    Frequency of Communication with Friends and Family: More than three times a week    Frequency of Social Gatherings with Friends and Family: Once a week    Attends Religious Services: More than 4 times per year    Active Member of [employer] or Organizations: No    Attends Banker Meetings: Not on file    Marital Status: Married  Catering Manager Violence: Not At Risk (02/12/2023)   Humiliation, Afraid, Rape, and Kick questionnaire    Fear of Current or Ex-Partner: No    Emotionally Abused: No    Physically Abused: No    Sexually Abused: No    Review of Systems  All other systems reviewed and are negative.       Objective   BP 122/82   Pulse 78   Ht 5' 7 (1.702 m)   Wt 284 lb (128.8 kg)   LMP 01/16/2024   SpO2 94%   BMI 44.48 kg/m   Physical Exam Vitals and nursing note reviewed.  Constitutional:      General: She is not in acute distress. Cardiovascular:     Rate and  Rhythm: Normal rate and regular rhythm.  Pulmonary:     Effort: Pulmonary effort is normal.     Breath sounds: Normal breath sounds.  Abdominal:  Palpations: Abdomen is soft.     Tenderness: There is no abdominal tenderness.  Musculoskeletal:     Right knee: Normal. No deformity, effusion or erythema.     Left knee: Normal. No deformity, effusion or erythema.  Neurological:     General: No focal deficit present.     Mental Status: She is alert and oriented to person, place, and time.         Assessment & Plan:   1. Essential hypertension (Primary) Appears stable. Continue   2. Hyperlipidemia, unspecified hyperlipidemia type Continue   3. Chronic pain of both knees Volteran gel prescribed.   4. Vitamin D  deficiency Vitamin D  refilled.   5. Class 3 severe obesity with serious comorbidity and body mass index (BMI) of 40.0 to 44.9 in adult, unspecified obesity type (HCC) \    No follow-ups on file.   Tanda Raguel SQUIBB, MD

## 2024-02-10 ENCOUNTER — Ambulatory Visit (HOSPITAL_COMMUNITY): Admission: RE | Admit: 2024-02-10 | Source: Ambulatory Visit

## 2024-02-10 ENCOUNTER — Other Ambulatory Visit (HOSPITAL_COMMUNITY): Payer: Self-pay | Admitting: Physician Assistant

## 2024-02-10 DIAGNOSIS — M79605 Pain in left leg: Secondary | ICD-10-CM

## 2024-02-11 ENCOUNTER — Ambulatory Visit (HOSPITAL_COMMUNITY)
Admission: RE | Admit: 2024-02-11 | Discharge: 2024-02-11 | Disposition: A | Discharge status: Home / Self Care | Source: Ambulatory Visit | Attending: Physician Assistant | Admitting: Physician Assistant

## 2024-02-11 DIAGNOSIS — M79605 Pain in left leg: Secondary | ICD-10-CM | POA: Insufficient documentation

## 2024-02-17 ENCOUNTER — Ambulatory Visit (INDEPENDENT_AMBULATORY_CARE_PROVIDER_SITE_OTHER): Admitting: Family Medicine

## 2024-02-17 ENCOUNTER — Encounter (INDEPENDENT_AMBULATORY_CARE_PROVIDER_SITE_OTHER): Payer: Self-pay | Admitting: Family Medicine

## 2024-02-17 VITALS — BP 118/74 | HR 76 | Temp 98.8°F | Ht 67.0 in | Wt 281.0 lb

## 2024-02-17 DIAGNOSIS — Z6841 Body Mass Index (BMI) 40.0 and over, adult: Secondary | ICD-10-CM

## 2024-02-17 DIAGNOSIS — I10 Essential (primary) hypertension: Secondary | ICD-10-CM

## 2024-02-17 DIAGNOSIS — D509 Iron deficiency anemia, unspecified: Secondary | ICD-10-CM

## 2024-02-17 DIAGNOSIS — E785 Hyperlipidemia, unspecified: Secondary | ICD-10-CM | POA: Diagnosis not present

## 2024-02-17 DIAGNOSIS — R7303 Prediabetes: Secondary | ICD-10-CM | POA: Diagnosis not present

## 2024-02-17 DIAGNOSIS — S0300XD Dislocation of jaw, unspecified side, subsequent encounter: Secondary | ICD-10-CM

## 2024-02-17 NOTE — Progress Notes (Signed)
 SUBJECTIVE:  Chief Complaint: Obesity  Interim History: Patient felt she did better this time around.  She ate the same options consistently.  Eggs in the am, sandwich for lunch and chicken and broccoli for dinner.  Denies hunger.  She was able to get all the meals in consistently.  For Thanksgiving she is planning on trying to have options for food that she can eat. Her husband was helping by packing her breakfast.  For snack calories she had peanuts and 100 calorie Oreos.  Wasn't having cravings to eat the snacks.  For month of December she doesn't have much planned.   Gabriella Riley is here to discuss her progress with her obesity treatment plan. She is on the Category 3 Plan and states she is following her eating plan approximately 75 % of the time. She states she is walking 25-30 minutes 3 times per week.   OBJECTIVE: Visit Diagnoses: Problem List Items Addressed This Visit       Cardiovascular and Mediastinum   Essential hypertension     Other   Prediabetes   HLD (hyperlipidemia)   Other Visit Diagnoses       Dislocation of temporomandibular joint, subsequent encounter    -  Primary     Iron deficiency anemia, unspecified iron deficiency anemia type         BMI 40.0-44.9, adult (HCC)         Morbid obesity (HCC)           Vitals Temp: 98.8 F (37.1 C) BP: 118/74 Pulse Rate: 76 SpO2: 98 %   Anthropometric Measurements Height: 5' 7 (1.702 m) Weight: 281 lb (127.5 kg) BMI (Calculated): 44 Weight at Last Visit: 282 lb Weight Lost Since Last Visit: 1 Weight Gained Since Last Visit: 0 Starting Weight: 270 lb Total Weight Loss (lbs): 1 lb (0.454 kg)   Body Composition  Body Fat %: 49.4 % Fat Mass (lbs): 138.8 lbs Muscle Mass (lbs): 135 lbs Total Body Water (lbs): 96 lbs Visceral Fat Rating : 16   Other Clinical Data Today's Visit #: 17 Starting Date: 09/20/20 Comments: Cat 3     ASSESSMENT AND PLAN: Assessment & Plan Dislocation of temporomandibular  joint, subsequent encounter Patient and I discussed treatment for TMJ dysfunction and will refer to neurology in the new year.  Patient has tried mouthguard's and may benefit from further intervention such as Botox therapy. Essential hypertension Blood pressure well-controlled today with no chest pain, chest pressure, or headache reported.  No refill of chlorthalidone  needed. Hyperlipidemia, unspecified hyperlipidemia type Tolerating Zocor  40 mg daily with no myalgias reported.  No refills needed today.  Continue to her current treatment strategy.  Discussed most recent labs with patient and LDL is now below 100. Prediabetes Improvement in A1c from 6.1-5.9.  Patient is working on lifestyle modifications and dietary changes.  Continue current treatment and will need repeat labs in February to reassess. Iron deficiency anemia, unspecified iron deficiency anemia type MCV 78 on most recent labs.  Patient is not on iron supplementation at this time.  Discussed ways to increase dietary iron through foods and cooking with cast iron. BMI 40.0-44.9, adult [facility])  Morbid obesity (HCC)    Diet: Darline is currently in the action stage of change. As such, her goal is to continue with weight loss efforts and has agreed to the Category 3 Plan.   Exercise:  All adults should avoid inactivity. Some activity is better than none, and adults who participate in any amount  of physical activity, gain some health benefits.  Behavior Modification:  We discussed the following Behavioral Modification Strategies today: increasing lean protein intake, decreasing simple carbohydrates, increasing vegetables, meal planning and cooking strategies, and holiday eating strategies.   No follow-ups on file.   She was informed of the importance of frequent follow up visits to maximize her success with intensive lifestyle modifications for her multiple health conditions.  Attestation Statements:   Reviewed by clinician on day  of visit: allergies, medications, problem list, medical history, surgical history, family history, social history, and previous encounter notes.   Adelita Cho, MD

## 2024-02-21 NOTE — Assessment & Plan Note (Signed)
 Improvement in A1c from 6.1-5.9.  Patient is working on lifestyle modifications and dietary changes.  Continue current treatment and will need repeat labs in February to reassess.

## 2024-02-21 NOTE — Assessment & Plan Note (Signed)
 Blood pressure well-controlled today with no chest pain, chest pressure, or headache reported.  No refill of chlorthalidone  needed.

## 2024-02-21 NOTE — Assessment & Plan Note (Signed)
 Tolerating Zocor  40 mg daily with no myalgias reported.  No refills needed today.  Continue to her current treatment strategy.  Discussed most recent labs with patient and LDL is now below 100.

## 2024-02-22 ENCOUNTER — Encounter: Admitting: Internal Medicine

## 2024-03-14 ENCOUNTER — Encounter (INDEPENDENT_AMBULATORY_CARE_PROVIDER_SITE_OTHER): Payer: Self-pay | Admitting: Family Medicine

## 2024-03-14 ENCOUNTER — Ambulatory Visit (INDEPENDENT_AMBULATORY_CARE_PROVIDER_SITE_OTHER): Admitting: Family Medicine

## 2024-03-14 VITALS — BP 112/76 | HR 81 | Temp 98.6°F | Ht 67.0 in | Wt 281.0 lb

## 2024-03-14 DIAGNOSIS — Z6841 Body Mass Index (BMI) 40.0 and over, adult: Secondary | ICD-10-CM | POA: Diagnosis not present

## 2024-03-14 DIAGNOSIS — R632 Polyphagia: Secondary | ICD-10-CM

## 2024-03-14 MED ORDER — PHENTERMINE HCL 8 MG PO TABS: ORAL_TABLET | ORAL | 0 refills | Status: DC

## 2024-03-14 MED ORDER — TOPIRAMATE 25 MG PO TABS: ORAL_TABLET | ORAL | 0 refills | Status: DC

## 2024-03-14 NOTE — Progress Notes (Signed)
 "  SUBJECTIVE:  Chief Complaint: Obesity  Interim History: Overall patient has been good.  She mentions Thanksgiving was difficult to control her intake.  She mentions she has been struggling to regain control after Thanksgiving. Before Thanksgiving she was eating mostly the same things over and over.  Then when Thanksgiving came she was eating leftovers and then slowly opted to order out more.  The next two weeks she is trying to plan food intake for the holidays. She is home this week so she is wondering about making food changes.   Gabriella Riley is here to discuss her progress with her obesity treatment plan. She is on the Category 3 Plan and states she is following her eating plan approximately 50 % of the time. She states she is walking 60 minutes 1 time per week.   OBJECTIVE: Visit Diagnoses: Problem List Items Addressed This Visit   None Visit Diagnoses       Polyphagia    -  Primary   Relevant Medications   Phentermine  HCl (LOMAIRA ) 8 MG TABS   topiramate  (TOPAMAX ) 25 MG tablet     BMI 40.0-44.9, adult (HCC)       Relevant Medications   Phentermine  HCl (LOMAIRA ) 8 MG TABS     Morbid obesity (HCC)       Relevant Medications   Phentermine  HCl (LOMAIRA ) 8 MG TABS       Vitals Temp: 98.6 F (37 C) BP: 112/76 Pulse Rate: 81 SpO2: 100 %   Anthropometric Measurements Height: 5' 7 (1.702 m) Weight: 281 lb (127.5 kg) BMI (Calculated): 44 Weight at Last Visit: 281 lb Weight Lost Since Last Visit: 0 Weight Gained Since Last Visit: 0 Starting Weight: 270 lb Total Weight Loss (lbs): 0 lb (0 kg)   Body Composition  Body Fat %: 50.7 % Fat Mass (lbs): 142.6 lbs Muscle Mass (lbs): 131.8 lbs Total Body Water (lbs): 94 lbs Visceral Fat Rating : 16   Other Clinical Data Today's Visit #: 18 Starting Date: 09/20/20 Comments: Cat 3     ASSESSMENT AND PLAN: Assessment & Plan Polyphagia Medication options were discussed extensively with patient today.  Given availability,  cost, insurance coverage and other medical comorbidities the decision was reached to start medications Lomaira  and Topiramate .  These medications will be used as a substitute for brand name Qsymia in doses that are synanomous.  Patient understands this is an off label usage.  We discussed the titration schedule with the goal of 5% weight loss at 3 months at a treatment dose.  The first two weeks will be a starting dose of 25mg  of Topiramate  and 4mg  of Lomaira .  After two weeks the patient will increase to 50mg  of Topiramate  and 8mg  of Lomaira  and will stay on this dose until the next appointment.  Controlled substance contract was discussed and signed today.  Prescriptions sent in.  PDMP was checked.  Has IUD for birth control.  BMI 40.0-44.9, adult [facility])  Morbid obesity (HCC)    Diet: Gabriella Riley is currently in the action stage of change. As such, her goal is to continue with weight loss efforts and has agreed to the Category 3 Plan + 300 and the Pescatarian Plan.   Exercise:  All adults should avoid inactivity. Some activity is better than none, and adults who participate in any amount of physical activity, gain some health benefits.  Behavior Modification:  We discussed the following Behavioral Modification Strategies today: increasing lean protein intake, decreasing simple carbohydrates, meal planning and  cooking strategies, holiday eating strategies, and celebration eating strategies. We discussed various medication options to help Gabriella Riley with her weight loss efforts and we both agreed to start antiobesity meidcations. Medication options were discussed extensively with patient today.  Given availability, cost, insurance coverage and other medical comorbidities the decision was reached to start medications Lomaira  and Topiramate .  These medications will be used as a substitute for brand name Qsymia in doses that are synanomous.  Patient understands this is an off label usage.  We discussed the  titration schedule with the goal of 5% weight loss at 3 months at a treatment dose.  The first two weeks will be a starting dose of 25mg  of Topiramate  and 4mg  of Lomaira .  After two weeks the patient will increase to 50mg  of Topiramate  and 8mg  of Lomaira  and will stay on this dose until the next appointment.  Controlled substance contract was discussed and signed today.  Prescriptions sent in.  .  Return in about 3 weeks (around 04/04/2024).   She was informed of the importance of frequent follow up visits to maximize her success with intensive lifestyle modifications for her multiple health conditions.  Attestation Statements:   Reviewed by clinician on day of visit: allergies, medications, problem list, medical history, surgical history, family history, social history, and previous encounter notes.     Gabriella Cho, MD "

## 2024-03-15 ENCOUNTER — Encounter (INDEPENDENT_AMBULATORY_CARE_PROVIDER_SITE_OTHER): Payer: Self-pay | Admitting: Family Medicine

## 2024-03-15 ENCOUNTER — Telehealth (INDEPENDENT_AMBULATORY_CARE_PROVIDER_SITE_OTHER): Payer: Self-pay

## 2024-03-15 NOTE — Telephone Encounter (Signed)
 L/mess for pt to call back 12:16 03/15/24

## 2024-03-15 NOTE — Telephone Encounter (Signed)
 PA for Lomaira  8MG  was closed. PA is now complete.      Outcome N/A on December 22 by [employer] Your PA request has been closed. [id number]

## 2024-03-30 ENCOUNTER — Other Ambulatory Visit: Payer: Self-pay | Admitting: Internal Medicine

## 2024-03-30 DIAGNOSIS — Z1231 Encounter for screening mammogram for malignant neoplasm of breast: Secondary | ICD-10-CM

## 2024-04-04 ENCOUNTER — Encounter: Payer: Self-pay | Admitting: Nurse Practitioner

## 2024-04-04 ENCOUNTER — Ambulatory Visit: Payer: Self-pay | Admitting: Nurse Practitioner

## 2024-04-04 VITALS — Temp 98.5°F | Ht 67.0 in | Wt 281.1 lb

## 2024-04-04 DIAGNOSIS — R10A1 Flank pain, right side: Secondary | ICD-10-CM | POA: Diagnosis not present

## 2024-04-04 DIAGNOSIS — I10 Essential (primary) hypertension: Secondary | ICD-10-CM | POA: Diagnosis not present

## 2024-04-04 DIAGNOSIS — F5101 Primary insomnia: Secondary | ICD-10-CM

## 2024-04-04 DIAGNOSIS — E7800 Pure hypercholesterolemia, unspecified: Secondary | ICD-10-CM | POA: Diagnosis not present

## 2024-04-04 DIAGNOSIS — Z6841 Body Mass Index (BMI) 40.0 and over, adult: Secondary | ICD-10-CM | POA: Diagnosis not present

## 2024-04-04 DIAGNOSIS — R42 Dizziness and giddiness: Secondary | ICD-10-CM

## 2024-04-04 DIAGNOSIS — Z2821 Immunization not carried out because of patient refusal: Secondary | ICD-10-CM | POA: Diagnosis not present

## 2024-04-04 DIAGNOSIS — Z7689 Persons encountering health services in other specified circumstances: Secondary | ICD-10-CM | POA: Diagnosis not present

## 2024-04-04 DIAGNOSIS — Z8639 Personal history of other endocrine, nutritional and metabolic disease: Secondary | ICD-10-CM | POA: Diagnosis not present

## 2024-04-04 DIAGNOSIS — E559 Vitamin D deficiency, unspecified: Secondary | ICD-10-CM

## 2024-04-04 DIAGNOSIS — R7303 Prediabetes: Secondary | ICD-10-CM | POA: Diagnosis not present

## 2024-04-04 DIAGNOSIS — E66813 Obesity, class 3: Secondary | ICD-10-CM | POA: Diagnosis not present

## 2024-04-04 NOTE — Progress Notes (Signed)
 Gabriella Riley, CMA,acting as a neurosurgeon for Gabriella Ada, FNP.,have documented all relevant documentation on the behalf of Gabriella Ada, FNP,as directed by  Gabriella Ada, FNP while in the presence of Gabriella Ada, FNP.  Subjective:  Patient ID: Gabriella Riley , female    DOB: 12/22/1975 , 49 y.o.   MRN: [id number]  Chief Complaint  Patient presents with   Establish Care    Patient presents today to establish care, Patient reports compliance with medication. Patient denies any chest pain, SOB, or headaches.     Dizziness    Patient reports she has been getting light head and dizzy for the past month.    Back Pain    Patient reports she has been having lower back pain and is wondering if she has a kidney stone.     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 Married. She has 4 children - 43 y/o girl, 48 son, 27 y/o son and 72 y/o daughter (all are healthy) and she has 2 step children.   History of blood clots - on top of left shin - had blood thinners and improved. She had angio edema but unsure of the cause. She did go to the allergist had allergy  to trees, roaches and grass. Anemia - has taken a supplement in the past. No hysterectomy - she is followed by a GYN at [facility].  She also goes to healthy weight and wellness - since November. She is not losing any weight and her next appt is January 26th, she is on topiramate  and she has not picked up the phentermine  due to not being in stock. She is exercising as well.   Wt Readings from Last 3 Encounters: 04/04/24 : 281 lb 1.6 oz (127.5 kg) 03/14/24 : 281 lb (127.5 kg) 02/17/24 : 281 lb (127.5 kg)      Dizziness This is a new problem. The current episode started more than 1 month ago. The problem occurs intermittently. Associated symptoms include nausea and vertigo. Pertinent negatives include no abdominal pain, congestion, coughing, fatigue, headaches, sore throat or weakness. The symptoms are aggravated  by standing.    Discussed the use of AI scribe software for clinical note transcription with the patient, who gave verbal consent to proceed.  History of Present Illness Gabriella Riley is a 49 year old female with hypertension who presents with dizziness and back pain.  She has been experiencing dizziness and lightheadedness for the past month, particularly when standing or walking for extended periods. During an episode while Christmas shopping, she felt like she was about to pass out, accompanied by sweating and nausea. These episodes have occurred about four times, and she feels better after sitting down. She has not checked her blood pressure during these episodes. No shortness of breath, chest pain, ear pain, or previous history of vertigo.  She has a history of hypertension, which began as gestational hypertension 14 years ago and has persisted. She is currently taking chlorthalidone  at night and reports experiencing swelling in her feet and ankles. She is also diagnosed with high cholesterol and pre-diabetes, with her last A1c at 5.9, improved from 6.1 in May. She is being treated for vitamin D  deficiency with a prescription of 15,000 IU once a week.  She has experienced back pain since before November, describing it as a dull pain that can increase to an 8 out of 10 in severity. The pain worsens with movement or rolling over.  She is concerned about the possibility of kidney stones, although she has not had a kidney stone before. She has not found relief with Tylenol .  She has a history of anemia but has not been advised to take iron supplements recently. She also has a history of angioedema, which occurred once last year or the year before, and was evaluated by an allergist who found no food allergies.  She is currently on a weight management program since November, which includes a calorie count diet and topiramate . She was prescribed phentermine , but it has not been available at her  pharmacy. She is also engaging in exercise as part of the program. She reports occasional knee pain and has received a cortisone shot for arthritis, mentioning that she might need surgery due to lack of cartilage.  Past Medical History:  Diagnosis Date   Anemia    Angio-edema    Arthritis    Back pain    Bilateral swelling of feet and ankles    Constipation    Food allergy     GERD (gastroesophageal reflux disease)    History of blood clots    Hyperlipidemia    Hypertension    Joint pain    Kidney problem    Lactose intolerance    Other fatigue    Prediabetes    Shortness of breath    Shortness of breath on exertion    Syphilis 07/02/2022   Urticaria    Vitamin D  deficiency      Family History  Problem Relation Age of Onset   Hypertension Mother    Allergic rhinitis Mother    Obesity Father    Sleep apnea Father    Heart disease Father    Diabetes Father    Hyperlipidemia Father    Hypertension Father    Heart failure Father    Healthy Sister    Prostate cancer Brother    Heart attack Brother    Arthritis Maternal Grandmother    Heart disease Other    Sleep apnea Other    Obesity Other    Colon cancer Neg Hx    Colon polyps Neg Hx    Esophageal cancer Neg Hx    Rectal cancer Neg Hx    Stomach cancer Neg Hx     Current Medications[1]   Allergies[2]   Review of Systems  Constitutional: Negative.  Negative for fatigue.  HENT:  Negative for congestion and sore throat.   Respiratory: Negative.  Negative for cough and wheezing.   Cardiovascular: Negative.   Gastrointestinal:  Positive for nausea. Negative for abdominal pain.  Musculoskeletal:  Positive for back pain (right low back pain, tylenol  is ineffective.).  Neurological:  Positive for dizziness and vertigo. Negative for weakness and headaches.  Psychiatric/Behavioral: Negative.       Today's Vitals   04/04/24 1457  Temp: 98.5 F (36.9 C)  TempSrc: Oral  Weight: 281 lb 1.6 oz (127.5 kg)  Height:  5' 7 (1.702 m)  PainSc: 4   PainLoc: Back   Body mass index is 44.03 kg/m.  Wt Readings from Last 3 Encounters:  04/04/24 281 lb 1.6 oz (127.5 kg)  03/14/24 281 lb (127.5 kg)  02/17/24 281 lb (127.5 kg)    No data found.   Objective:  Physical Exam Vitals and nursing note reviewed.  Constitutional:      Appearance: She is well-developed.  HENT:     Head: Normocephalic and atraumatic.  Eyes:     Pupils: Pupils are equal, round,  and reactive to light.  Cardiovascular:     Rate and Rhythm: Normal rate and regular rhythm.     Pulses: Normal pulses.     Heart sounds: Normal heart sounds. No murmur heard. Pulmonary:     Effort: Pulmonary effort is normal. No respiratory distress.     Breath sounds: Normal breath sounds. No wheezing.  Musculoskeletal:        General: Tenderness (right flank tenderness) present. Normal range of motion.  Skin:    General: Skin is warm and dry.     Capillary Refill: Capillary refill takes less than 2 seconds.  Neurological:     General: No focal deficit present.     Mental Status: She is alert and oriented to person, place, and time.     Cranial Nerves: No cranial nerve deficit or dysarthria.     Motor: Motor function is intact. No weakness.  Psychiatric:        Mood and Affect: Mood normal.       Assessment And Plan:   Assessment & Plan Prediabetes Will check A1c, no current medications Pure hypercholesterolemia  Primary insomnia Will check from any metabolic causes.  Vitamin D  deficiency Will check vitamin D  level and supplement as needed.    Also encouraged to spend 15 minutes in the sun daily.   Essential hypertension Hypertension managed with chlorthalidone . Possible dehydration contributing to dizziness. - Continue chlorthalidone . - Advised increasing water intake to 64 ounces daily. Dizziness Intermittent dizziness and lightheadedness, especially post-activity and meals. Possible dehydration due to low water intake and  chlorthalidone . - Ordered blood tests for iron, hemoglobin, and thyroid  function. - Advised increasing water intake to 64 ounces daily. Right flank pain Chronic dull right flank pain with episodes of increased severity. Differential includes muscular issues, UTI, and kidney stones. Low likelihood of kidney stones. Dehydration risk due to low water intake and chlorthalidone . - Ordered renal ultrasound. - Obtained urine sample to rule out UTI. - Advised increasing water intake to 64 ounces daily. Influenza vaccination declined  Class 3 severe obesity due to excess calories without serious comorbidity with body mass index (BMI) of 40.0 to 44.9 in adult [facility]) Obesity managed with topiramate  and phentermine . Difficulty obtaining phentermine . Engaged in diet and exercise. - Advised contacting pharmacy for phentermine  availability or transfer prescription. - Continue diet and exercise regimen. History of iron deficiency Previous iron supplementation. No recent iron level check. - Ordered blood tests for iron levels and hemoglobin. Establishing care with new doctor, encounter for   Orders Placed This Encounter  Procedures   Microscopic Examination   US  RENAL   CT HEAD WO CONTRAST ( )   Microalbumin / creatinine urine ratio   Urinalysis, Complete   CBC with Diff   TSH   Iron, TIBC and Ferritin Panel     Return in about 4 months (around 08/02/2024) for phy when able.  Patient was given opportunity to ask questions. Patient verbalized understanding of the plan and was able to repeat key elements of the plan. All questions were answered to their satisfaction.   Gabriella Gabriella Ada, FNP, have reviewed all documentation for this visit. The documentation on 04/04/24 for the exam, diagnosis, procedures, and orders are all accurate and complete.    IF YOU HAVE BEEN REFERRED TO A SPECIALIST, IT MAY TAKE 1-2 WEEKS TO SCHEDULE/PROCESS THE REFERRAL. IF YOU HAVE NOT HEARD FROM US /SPECIALIST IN TWO  WEEKS, PLEASE GIVE US  A CALL AT [phone].      [1]  Current Outpatient Medications:    acetaminophen  (TYLENOL ) 500 MG tablet, Take 500-1,000 mg by mouth every 6 (six) hours as needed (for pain.)., Disp: , Rfl:    albuterol  (VENTOLIN  HFA) 108 (90 Base) MCG/ACT inhaler, Inhale 2 puffs into the lungs every 6 (six) hours as needed for wheezing or shortness of breath., Disp: 8 g, Rfl: 0   chlorthalidone  (HYGROTON ) 25 MG tablet, TAKE 1 TABLET (25 MG TOTAL) BY MOUTH DAILY., Disp: 90 tablet, Rfl: 1   diclofenac  Sodium (VOLTAREN ) 1 % GEL, Apply 4 g topically 4 (four) times daily., Disp: 350 g, Rfl: 1   famotidine  (PEPCID ) 20 MG tablet, Take 1 tablet (20 mg total) by mouth 2 (two) times daily as needed for heartburn or indigestion., Disp: 60 tablet, Rfl: 5   fexofenadine  (ALLEGRA  ALLERGY ) 180 MG tablet, Take 1 tablet (180 mg total) by mouth 2 (two) times daily as needed., Disp: 60 tablet, Rfl: 5   fluticasone  (FLONASE ) 50 MCG/ACT nasal spray, Place 2 sprays into both nostrils daily., Disp: 16 g, Rfl: 5   Multiple Vitamins-Minerals (ADULT GUMMY PO), Take 2 tablets by mouth daily., Disp: , Rfl:    simvastatin  (ZOCOR ) 40 MG tablet, Take 1 tablet (40 mg total) by mouth at bedtime., Disp: 90 tablet, Rfl: 1   topiramate  (TOPAMAX ) 25 MG tablet, Take 1 tablet (25 mg total) by mouth daily for 14 days, THEN 2 tablets (50 mg total) daily for 16 days., Disp: 46 tablet, Rfl: 0   Vitamin D , Ergocalciferol , (DRISDOL ) 1.25 MG (50000 UNIT) CAPS capsule, Take 1 capsule (50,000 Units total) by mouth every 7 (seven) days., Disp: 12 capsule, Rfl: 0   Phentermine  HCl (LOMAIRA ) 8 MG TABS, Take 0.5 tablets (4 mg total) by mouth daily at 6 (six) AM for 14 days, THEN 1 tablet (8 mg total) daily at 6 (six) AM for 16 days. (Patient not taking: No sig reported), Disp: 23 tablet, Rfl: 0 [2]  Allergies Allergen Reactions   Shellfish Allergy     Latex Rash

## 2024-04-05 ENCOUNTER — Encounter: Payer: Self-pay | Admitting: Nurse Practitioner

## 2024-04-05 LAB — CBC WITH DIFFERENTIAL/PLATELET
Basophils Absolute: 0.1 x10E3/uL (ref 0.0–0.2)
Basos: 0 %
EOS (ABSOLUTE): 0.2 x10E3/uL (ref 0.0–0.4)
Eos: 2 %
Hematocrit: 44.4 % (ref 34.0–46.6)
Hemoglobin: 13.7 g/dL (ref 11.1–15.9)
Immature Grans (Abs): 0 x10E3/uL (ref 0.0–0.1)
Immature Granulocytes: 0 %
Lymphocytes Absolute: 2.7 x10E3/uL (ref 0.7–3.1)
Lymphs: 19 %
MCH: 23.7 pg — ABNORMAL LOW (ref 26.6–33.0)
MCHC: 30.9 g/dL — ABNORMAL LOW (ref 31.5–35.7)
MCV: 77 fL — ABNORMAL LOW (ref 79–97)
Monocytes Absolute: 0.8 x10E3/uL (ref 0.1–0.9)
Monocytes: 6 %
Neutrophils Absolute: 10.3 x10E3/uL — ABNORMAL HIGH (ref 1.4–7.0)
Neutrophils: 73 %
Platelets: 334 x10E3/uL (ref 150–450)
RBC: 5.77 x10E6/uL — ABNORMAL HIGH (ref 3.77–5.28)
RDW: 14.8 % (ref 11.7–15.4)
WBC: 14.2 x10E3/uL — ABNORMAL HIGH (ref 3.4–10.8)

## 2024-04-05 LAB — URINALYSIS, COMPLETE
Bilirubin, UA: NEGATIVE
Glucose, UA: NEGATIVE
Ketones, UA: NEGATIVE
Nitrite, UA: NEGATIVE
Protein,UA: NEGATIVE
RBC, UA: NEGATIVE
Specific Gravity, UA: 1.028 (ref 1.005–1.030)
Urobilinogen, Ur: 1 mg/dL (ref 0.2–1.0)
pH, UA: 5 (ref 5.0–7.5)

## 2024-04-05 LAB — TSH: TSH: 0.723 u[IU]/mL (ref 0.450–4.500)

## 2024-04-05 LAB — MICROALBUMIN / CREATININE URINE RATIO
Creatinine, Urine: 237.2 mg/dL
Microalb/Creat Ratio: 3 mg/g{creat} (ref 0–29)
Microalbumin, Urine: 7.8 ug/mL

## 2024-04-05 LAB — IRON,TIBC AND FERRITIN PANEL
Ferritin: 110 ng/mL (ref 15–150)
Iron Saturation: 6 % — CL (ref 15–55)
Iron: 21 ug/dL — ABNORMAL LOW (ref 27–159)
Total Iron Binding Capacity: 334 ug/dL (ref 250–450)
UIBC: 313 ug/dL (ref 131–425)

## 2024-04-05 LAB — MICROSCOPIC EXAMINATION: Casts: NONE SEEN /LPF

## 2024-04-10 ENCOUNTER — Ambulatory Visit: Payer: Self-pay | Admitting: Nurse Practitioner

## 2024-04-10 DIAGNOSIS — R42 Dizziness and giddiness: Secondary | ICD-10-CM | POA: Insufficient documentation

## 2024-04-10 DIAGNOSIS — Z8639 Personal history of other endocrine, nutritional and metabolic disease: Secondary | ICD-10-CM | POA: Insufficient documentation

## 2024-04-10 DIAGNOSIS — R10A1 Flank pain, right side: Secondary | ICD-10-CM | POA: Insufficient documentation

## 2024-04-10 NOTE — Assessment & Plan Note (Signed)
 Obesity managed with topiramate  and phentermine . Difficulty obtaining phentermine . Engaged in diet and exercise. - Advised contacting pharmacy for phentermine  availability or transfer prescription. - Continue diet and exercise regimen.

## 2024-04-10 NOTE — Assessment & Plan Note (Signed)
 Will check from any metabolic causes.

## 2024-04-10 NOTE — Assessment & Plan Note (Signed)
 Hypertension managed with chlorthalidone . Possible dehydration contributing to dizziness. - Continue chlorthalidone . - Advised increasing water intake to 64 ounces daily.

## 2024-04-10 NOTE — Assessment & Plan Note (Signed)
 Previous iron supplementation. No recent iron level check. - Ordered blood tests for iron levels and hemoglobin.

## 2024-04-10 NOTE — Assessment & Plan Note (Signed)
 Chronic dull right flank pain with episodes of increased severity. Differential includes muscular issues, UTI, and kidney stones. Low likelihood of kidney stones. Dehydration risk due to low water intake and chlorthalidone . - Ordered renal ultrasound. - Obtained urine sample to rule out UTI. - Advised increasing water intake to 64 ounces daily.

## 2024-04-10 NOTE — Assessment & Plan Note (Signed)
 Intermittent dizziness and lightheadedness, especially post-activity and meals. Possible dehydration due to low water intake and chlorthalidone . - Ordered blood tests for iron, hemoglobin, and thyroid  function. - Advised increasing water intake to 64 ounces daily.

## 2024-04-10 NOTE — Assessment & Plan Note (Signed)
 Will check vitamin D  level and supplement as needed.    Also encouraged to spend 15 minutes in the sun daily.

## 2024-04-10 NOTE — Assessment & Plan Note (Signed)
 Will check A1c, no current medications

## 2024-04-11 ENCOUNTER — Other Ambulatory Visit

## 2024-04-11 DIAGNOSIS — D72829 Elevated white blood cell count, unspecified: Secondary | ICD-10-CM

## 2024-04-12 ENCOUNTER — Ambulatory Visit
Admission: RE | Admit: 2024-04-12 | Discharge: 2024-04-12 | Disposition: A | Source: Ambulatory Visit | Attending: Nurse Practitioner | Admitting: Nurse Practitioner

## 2024-04-12 DIAGNOSIS — R10A1 Flank pain, right side: Secondary | ICD-10-CM

## 2024-04-12 DIAGNOSIS — R42 Dizziness and giddiness: Secondary | ICD-10-CM

## 2024-04-13 LAB — URINE CULTURE

## 2024-04-14 ENCOUNTER — Other Ambulatory Visit: Payer: Self-pay | Admitting: Nurse Practitioner

## 2024-04-14 DIAGNOSIS — D508 Other iron deficiency anemias: Secondary | ICD-10-CM

## 2024-04-19 ENCOUNTER — Ambulatory Visit (INDEPENDENT_AMBULATORY_CARE_PROVIDER_SITE_OTHER): Admitting: Family Medicine

## 2024-04-19 VITALS — BP 121/80 | HR 90 | Temp 98.3°F | Ht 67.0 in | Wt 279.0 lb

## 2024-04-19 DIAGNOSIS — Z6841 Body Mass Index (BMI) 40.0 and over, adult: Secondary | ICD-10-CM

## 2024-04-19 DIAGNOSIS — D509 Iron deficiency anemia, unspecified: Secondary | ICD-10-CM | POA: Diagnosis not present

## 2024-04-19 MED ORDER — WEGOVY 1.5 MG PO TABS: 1.5000 mg | ORAL_TABLET | Freq: Every day | ORAL | 0 refills | Status: DC

## 2024-04-19 MED ORDER — WEGOVY 1.5 MG PO TABS: 1.5000 mg | ORAL_TABLET | Freq: Every day | ORAL | 0 refills | Status: AC

## 2024-04-19 NOTE — Progress Notes (Signed)
 "  SUBJECTIVE:  Chief Complaint: Obesity  Interim History: Patient had influenza and was not eating much until New Year's Eve.  She voices when she was able to start eating again she mentions she did not have much control of her intake.  Has yet to be able to start phentermine  due to back order.  She feels she can stay consistent with her food intake over the next few weeks. She recognizes she needs to meal prep more of her stuff.  Gabriella Riley is here to discuss her progress with her obesity treatment plan. She is on the Category 3 Plan + 300 and states she is following her eating plan approximately 50 % of the time. She states she is not exercising.   OBJECTIVE: Visit Diagnoses: Problem List Items Addressed This Visit   None Visit Diagnoses       Iron deficiency anemia, unspecified iron deficiency anemia type    -  Primary     BMI 40.0-44.9, adult (HCC)       Relevant Medications   semaglutide -weight management (WEGOVY ) 1.5 MG tablet     Morbid obesity (HCC)       Relevant Medications   semaglutide -weight management (WEGOVY ) 1.5 MG tablet       Vitals Temp: 98.3 F (36.8 C) BP: 121/80 Pulse Rate: 90 SpO2: 99 %   Anthropometric Measurements Height: 5' 7 (1.702 m) Weight: 279 lb (126.6 kg) BMI (Calculated): 43.69 Weight at Last Visit: 281 lb Weight Lost Since Last Visit: 2 Weight Gained Since Last Visit: 0 Starting Weight: 270 lb Total Weight Loss (lbs): 0 lb (0 kg)   Body Composition  Body Fat %: 51.1 % Fat Mass (lbs): 142.6 lbs Muscle Mass (lbs): 129.8 lbs Total Body Water (lbs): 95 lbs Visceral Fat Rating : 16   Other Clinical Data Today's Visit #: 19 Starting Date: 09/20/20 Comments: Cat 3 + 300     ASSESSMENT AND PLAN: Assessment & Plan Iron deficiency anemia, unspecified iron deficiency anemia type Patient has history of iron deficiency anemia.  Labs done on April 04, 2024 still showing low iron as well as low iron saturation.  This was done by  primary care and patient was referred to hematology oncology for further evaluation and possible iron infusions.  Will follow-up after patient sees hematology oncology as to what treatment plan is.  At this time she was encouraged to continue p.o. iron intake. BMI 40.0-44.9, adult [facility])  Morbid obesity (HCC)    Diet: Gabriella Riley is currently in the action stage of change. As such, her goal is to continue with weight loss efforts and has agreed to the Category 3 Plan + 300.  Exercise:  For substantial health benefits, adults should do at least 150 minutes (2 hours and 30 minutes) a week of moderate-intensity, or 75 minutes (1 hour and 15 minutes) a week of vigorous-intensity aerobic physical activity, or an equivalent combination of moderate- and vigorous-intensity aerobic activity. Aerobic activity should be performed in episodes of at least 10 minutes, and preferably, it should be spread throughout the week.  Behavior Modification:  We discussed the following Behavioral Modification Strategies today: increasing lean protein intake, decreasing simple carbohydrates, increasing vegetables, meal planning and cooking strategies, keeping healthy foods in the home, and planning for success. We discussed various medication options to help Gabriella Riley with her weight loss efforts and we both agreed to start Wegovy  pill thru [facility] pharmacy.   Return in about 3 weeks (around 05/10/2024).   She was informed of  the importance of frequent follow up visits to maximize her success with intensive lifestyle modifications for her multiple health conditions.  Attestation Statements:   Reviewed by clinician on day of visit: allergies, medications, problem list, medical history, surgical history, family history, social history, and previous encounter notes.   Gabriella Cho, MD "

## 2024-04-20 ENCOUNTER — Encounter (INDEPENDENT_AMBULATORY_CARE_PROVIDER_SITE_OTHER): Payer: Self-pay | Admitting: Family Medicine

## 2024-04-25 ENCOUNTER — Ambulatory Visit: Payer: Self-pay | Admitting: Nurse Practitioner

## 2024-04-26 ENCOUNTER — Other Ambulatory Visit: Payer: Self-pay | Admitting: Nurse Practitioner

## 2024-04-26 MED ORDER — MECLIZINE HCL 12.5 MG PO TABS: 12.5000 mg | ORAL_TABLET | Freq: Three times a day (TID) | ORAL | 0 refills | Status: AC | PRN

## 2024-05-05 ENCOUNTER — Ambulatory Visit: Admitting: Family Medicine

## 2024-05-09 ENCOUNTER — Ambulatory Visit

## 2024-05-19 ENCOUNTER — Ambulatory Visit (INDEPENDENT_AMBULATORY_CARE_PROVIDER_SITE_OTHER): Admitting: Family Medicine

## 2024-05-25 ENCOUNTER — Inpatient Hospital Stay

## 2024-05-25 ENCOUNTER — Inpatient Hospital Stay: Admitting: Hematology and Oncology

## 2024-08-09 ENCOUNTER — Encounter: Payer: Self-pay | Admitting: Nurse Practitioner
